# Patient Record
Sex: Female | Born: 1990 | Hispanic: No | Marital: Married | Smoking: Never smoker
Health system: Southern US, Community
[De-identification: ages and names within clinical notes are randomized; demographics above are authoritative.]

## PROBLEM LIST (undated history)

## (undated) ENCOUNTER — Inpatient Hospital Stay (HOSPITAL_COMMUNITY): Payer: Self-pay

## (undated) DIAGNOSIS — J45909 Unspecified asthma, uncomplicated: Secondary | ICD-10-CM

## (undated) DIAGNOSIS — Q839 Congenital malformation of breast, unspecified: Secondary | ICD-10-CM

## (undated) DIAGNOSIS — J40 Bronchitis, not specified as acute or chronic: Secondary | ICD-10-CM

## (undated) DIAGNOSIS — F419 Anxiety disorder, unspecified: Secondary | ICD-10-CM

## (undated) DIAGNOSIS — D649 Anemia, unspecified: Secondary | ICD-10-CM

## (undated) DIAGNOSIS — S83519A Sprain of anterior cruciate ligament of unspecified knee, initial encounter: Secondary | ICD-10-CM

## (undated) HISTORY — DX: Unspecified asthma, uncomplicated: J45.909

## (undated) HISTORY — DX: Anemia, unspecified: D64.9

## (undated) HISTORY — PX: DILATION AND CURETTAGE OF UTERUS: SHX78

## (undated) HISTORY — DX: Anxiety disorder, unspecified: F41.9

## (undated) HISTORY — DX: Bronchitis, not specified as acute or chronic: J40

---

## 2012-12-16 DIAGNOSIS — Z8659 Personal history of other mental and behavioral disorders: Secondary | ICD-10-CM | POA: Insufficient documentation

## 2013-04-23 ENCOUNTER — Encounter (HOSPITAL_COMMUNITY): Payer: Self-pay | Admitting: *Deleted

## 2013-04-23 ENCOUNTER — Inpatient Hospital Stay (HOSPITAL_COMMUNITY)
Admission: AD | Admit: 2013-04-23 | Discharge: 2013-04-23 | Disposition: A | Discharge status: Home / Self Care | Payer: Medicaid Other | Source: Ambulatory Visit | Attending: Obstetrics and Gynecology | Admitting: Obstetrics and Gynecology

## 2013-04-23 DIAGNOSIS — M545 Low back pain, unspecified: Secondary | ICD-10-CM

## 2013-04-23 DIAGNOSIS — O26899 Other specified pregnancy related conditions, unspecified trimester: Secondary | ICD-10-CM

## 2013-04-23 DIAGNOSIS — N949 Unspecified condition associated with female genital organs and menstrual cycle: Secondary | ICD-10-CM | POA: Insufficient documentation

## 2013-04-23 DIAGNOSIS — O4700 False labor before 37 completed weeks of gestation, unspecified trimester: Secondary | ICD-10-CM | POA: Insufficient documentation

## 2013-04-23 DIAGNOSIS — O99891 Other specified diseases and conditions complicating pregnancy: Secondary | ICD-10-CM

## 2013-04-23 DIAGNOSIS — Z3493 Encounter for supervision of normal pregnancy, unspecified, third trimester: Secondary | ICD-10-CM

## 2013-04-23 DIAGNOSIS — M549 Dorsalgia, unspecified: Secondary | ICD-10-CM | POA: Insufficient documentation

## 2013-04-23 LAB — CBC
HCT: 29.7 % — ABNORMAL LOW (ref 36.0–46.0)
MCH: 28.5 pg (ref 26.0–34.0)
MCHC: 34 g/dL (ref 30.0–36.0)
MCV: 83.9 fL (ref 78.0–100.0)
Platelets: 198 10*3/uL (ref 150–400)
RDW: 13.8 % (ref 11.5–15.5)
WBC: 10.3 10*3/uL (ref 4.0–10.5)

## 2013-04-23 LAB — WET PREP, GENITAL: Trich, Wet Prep: NONE SEEN

## 2013-04-23 LAB — URINE MICROSCOPIC-ADD ON

## 2013-04-23 LAB — URINALYSIS, ROUTINE W REFLEX MICROSCOPIC
Glucose, UA: NEGATIVE mg/dL
Hgb urine dipstick: NEGATIVE
Protein, ur: NEGATIVE mg/dL
Specific Gravity, Urine: 1.02 (ref 1.005–1.030)
pH: 7 (ref 5.0–8.0)

## 2013-04-23 LAB — DIFFERENTIAL
Basophils Absolute: 0 10*3/uL (ref 0.0–0.1)
Basophils Relative: 0 % (ref 0–1)
Eosinophils Absolute: 0.1 10*3/uL (ref 0.0–0.7)
Eosinophils Relative: 1 % (ref 0–5)
Lymphocytes Relative: 29 % (ref 12–46)
Monocytes Absolute: 1.1 10*3/uL — ABNORMAL HIGH (ref 0.1–1.0)
Neutrophils Relative %: 59 % (ref 43–77)

## 2013-04-23 LAB — HEPATITIS B SURFACE ANTIGEN: Hepatitis B Surface Ag: NEGATIVE

## 2013-04-23 NOTE — MAU Provider Note (Cosign Needed)
@MAUPATCONTACT @  Chief Complaint:  Back Pain and Vaginal Pain   Barbara Ortiz is  22 y.o. [id number] at [redacted]w[redacted]d presents complaining of back pain and vaginal pain. She describes this pain as sharp intermittent pain that shoots into her vaginal area and has been occuring for 1 week.  She endorses some mild lightheadedness on occasions, but corrects with sitting down.  She reports she had PNC at [facility] up until 27 weeks, were she then experienced some issues with her medicaid card and was unable to obtain care. She is now wanting to be seen in [place] and is willing to be seen in the [facility]. She was unable to have her GTT completed prior to interruption in care. She denies leaking, gush of fluid or vaginal bleeding. She denies contractions, she has been able to feel her baby move regularly. She reports she had a anatomy US completed and states it is a boy and there is no complications she is aware of.   Obstetrical/Gynecological History: OB History   Grav Para Term Preterm Abortions TAB SAB Ect Mult Living   5 3 3  0 1 0 1 0 0 3     Past Medical History: History reviewed. No pertinent past medical history.  Past Surgical History: History reviewed. No pertinent past surgical history.  Family History: History reviewed. No pertinent family history.  Social History: History  Substance Use Topics  . Smoking status: Never Smoker   . Smokeless tobacco: Never Used  . Alcohol Use: No    Allergies: No Known Allergies  Meds:  Prescriptions prior to admission  Medication Sig Dispense Refill  . flintstones complete (FLINTSTONES) 60 MG chewable tablet Chew 2 tablets by mouth daily.        Review of Systems -   Review of Systems  Constitutional: Negative for fever, chills, weight loss, malaise/fatigue and diaphoresis.  HENT: Negative for hearing loss, ear pain, nosebleeds, congestion, sore throat, neck pain, tinnitus and ear discharge.   Eyes: Negative for blurred vision, double vision, photophobia,  pain, discharge and redness.  Respiratory: Negative for cough, hemoptysis, sputum production, shortness of breath, wheezing and stridor.   Cardiovascular: Negative for chest pain, palpitations, orthopnea,  leg swelling  Gastrointestinal: Negative for abdominal pain heartburn, nausea, vomiting, diarrhea, constipation, blood in stool Genitourinary: Negative for dysuria, urgency, frequency, hematuria and flank pain.  Musculoskeletal: Negative for myalgias, back pain, joint pain and falls.  Skin: Negative for itching and rash.  Neurological: Negative for  tingling, tremors, sensory change, speech change, focal weakness, seizures, loss of consciousness, weakness and headaches.  Endo/Heme/Allergies: Negative for environmental allergies and polydipsia. Does not bruise/bleed easily.  Psychiatric/Behavioral: Negative for depression, suicidal ideas, hallucinations, memory loss and substance abuse. The patient is not nervous/anxious and does not have insomnia.      Physical Exam  Blood pressure 123/83, pulse 94, temperature 99 F (37.2 C), temperature source Oral, resp. rate 18, height 5\' 3"  (1.6 m), weight 76.658 kg (169 lb). GENERAL: Well-developed, well-nourished female in no acute distress.  LUNGS: Clear to auscultation bilaterally.  HEART: Regular rate and rhythm. 1/6 SM. ABDOMEN: Soft, nontender, nondistended, gravid.  EXTREMITIES: Nontender, no edema, 2+ distal pulses. NEURO: alert, oriented. No focal deficits.  CERVICAL EXAM: Dilatation: FT   Effacement: thick   Station: high  FHT:  Baseline rate 140 bpm   Variability moderate  Accelerations present   Decelerations none Contractions: irregular   Labs: Results for orders placed during the hospital encounter of 04/23/13 (from the past 24 hour(s))  URINALYSIS, ROUTINE W REFLEX MICROSCOPIC   Collection Time    04/23/13  1:15 PM      Result Value Range   Color, Urine YELLOW  YELLOW   APPearance CLEAR  CLEAR   Specific Gravity, Urine 1.020   1.005 - 1.030   pH 7.0  5.0 - 8.0   Glucose, UA NEGATIVE  NEGATIVE mg/dL   Hgb urine dipstick NEGATIVE  NEGATIVE   Bilirubin Urine NEGATIVE  NEGATIVE   Ketones, ur NEGATIVE  NEGATIVE mg/dL   Protein, ur NEGATIVE  NEGATIVE mg/dL   Urobilinogen, UA 0.2  0.0 - 1.0 mg/dL   Nitrite NEGATIVE  NEGATIVE   Leukocytes, UA TRACE (*) NEGATIVE  URINE MICROSCOPIC-ADD ON   Collection Time    04/23/13  1:15 PM      Result Value Range   Squamous Epithelial / LPF MANY (*) RARE   WBC, UA 3-6  <3 WBC/hpf   RBC / HPF 0-2  <3 RBC/hpf   Bacteria, UA FEW (*) RARE   Urine-Other MUCOUS PRESENT     Imaging Studies:  No results found.  Assessment/Plan: Barbara Ortiz is  22 y.o. [phone] at [redacted]w[redacted]d presents with back and vaginal pain.  UA: trace leuks, moderate bacteria, no nitrites, MANY squamous. Urine culture pending. Asx OB panel completed, along with GC probe  Wet prep: Neg for clue cells, trich or yeast Cervical exam: FT/thick/high  Fowarded pt information to [facility] for them to contact her regarding prenatal care.   Encouraged patient to drink plenty of water daily and continue PNV  Kuneff, Renee 10/8/20142:24 PM  I spoke with and examined patient and agree with resident's note and plan of care.  Tawana Scale, MD OB Fellow 04/23/2013 8:34 PM

## 2013-04-23 NOTE — MAU Note (Signed)
Pain in back and in vagina for past wk.  Last seen a month ago, uncomplicated. Was getting care in [facility].  Had regular medicaid, preg medicaid pending; got dropped due to financial; has been approved- waiting on card.

## 2013-04-24 LAB — GC/CHLAMYDIA PROBE AMP
CT Probe RNA: NEGATIVE
GC Probe RNA: NEGATIVE

## 2013-04-24 LAB — URINE CULTURE

## 2013-04-24 LAB — RUBELLA SCREEN: Rubella: 1.2 Index — ABNORMAL HIGH (ref <0.90)

## 2013-05-02 ENCOUNTER — Encounter (HOSPITAL_COMMUNITY): Payer: Self-pay | Admitting: *Deleted

## 2013-05-02 ENCOUNTER — Inpatient Hospital Stay (HOSPITAL_COMMUNITY)
Admission: AD | Admit: 2013-05-02 | Discharge: 2013-05-02 | Disposition: A | Discharge status: Home / Self Care | Payer: Medicaid Other | Source: Ambulatory Visit | Attending: Obstetrics & Gynecology | Admitting: Obstetrics & Gynecology

## 2013-05-02 DIAGNOSIS — O212 Late vomiting of pregnancy: Secondary | ICD-10-CM | POA: Insufficient documentation

## 2013-05-02 DIAGNOSIS — K2900 Acute gastritis without bleeding: Secondary | ICD-10-CM

## 2013-05-02 DIAGNOSIS — A059 Bacterial foodborne intoxication, unspecified: Secondary | ICD-10-CM | POA: Insufficient documentation

## 2013-05-02 DIAGNOSIS — R109 Unspecified abdominal pain: Secondary | ICD-10-CM | POA: Insufficient documentation

## 2013-05-02 DIAGNOSIS — K2970 Gastritis, unspecified, without bleeding: Secondary | ICD-10-CM | POA: Insufficient documentation

## 2013-05-02 DIAGNOSIS — O99891 Other specified diseases and conditions complicating pregnancy: Secondary | ICD-10-CM | POA: Insufficient documentation

## 2013-05-02 LAB — URINALYSIS, ROUTINE W REFLEX MICROSCOPIC
Glucose, UA: NEGATIVE mg/dL
Hgb urine dipstick: NEGATIVE
Nitrite: NEGATIVE
Specific Gravity, Urine: >1.03 — ABNORMAL HIGH (ref 1.005–1.030)
pH: 6.5 (ref 5.0–8.0)

## 2013-05-02 MED ORDER — ONDANSETRON 4 MG PO TBDP: 4.0000 mg | ORAL_TABLET | Freq: Three times a day (TID) | ORAL | Status: DC | PRN

## 2013-05-02 MED ORDER — PROMETHAZINE HCL 25 MG/ML IJ SOLN
12.5000 mg | Freq: Once | INTRAMUSCULAR | Status: AC
  Administered 2013-05-02: 12.5 mg via INTRAVENOUS
  Filled 2013-05-02: qty 1

## 2013-05-02 MED ORDER — ONDANSETRON 4 MG PO TBDP
4.0000 mg | ORAL_TABLET | Freq: Once | ORAL | Status: AC
  Administered 2013-05-02: 4 mg via ORAL
  Filled 2013-05-02: qty 1

## 2013-05-02 MED ORDER — LACTATED RINGERS IV BOLUS (SEPSIS)
1000.0000 mL | Freq: Once | INTRAVENOUS | Status: AC
  Administered 2013-05-02: 1000 mL via INTRAVENOUS

## 2013-05-02 NOTE — MAU Provider Note (Signed)
History     CSN: [id number]  Arrival date and time: 05/02/13 0049   None     Chief Complaint  Patient presents with  . Abdominal Pain  . Emesis  . Nausea   Abdominal Pain Associated symptoms include headaches, nausea (non bloody) and vomiting. Pertinent negatives include no constipation, diarrhea, dysuria or fever.  Emesis  Associated symptoms include abdominal pain and headaches. Pertinent negatives include no chest pain, chills, coughing, diarrhea, dizziness or fever.   Barbara Ortiz is a 22 y.o. [id number] at [redacted]w[redacted]d presents for evaluation of nausea and vomiting that has began this morning. Pt has had minimal PO tolerance today. She reports waking up nauseated then tried to eat breakfast and had non bloody emesis. Pt has been unable to tolerate anything by mouth since and has thrown up 5-6 times throughout the day. She attempted dinner of fried fish and hush puppies and she vomited that up as well. Pt reports a mild headache but otherwise is feeling well. Reports she has had 1-2 contractions an hour since this afternoon.  OB History   Grav Para Term Preterm Abortions TAB SAB Ect Mult Living   5 3 3  0 1 0 1 0 0 3      No past medical history on file.  No past surgical history on file.  Family History  Problem Relation Age of Onset  . Hypertension Mother     History  Substance Use Topics  . Smoking status: Never Smoker   . Smokeless tobacco: Never Used  . Alcohol Use: No    Allergies: No Known Allergies  Prescriptions prior to admission  Medication Sig Dispense Refill  . flintstones complete (FLINTSTONES) 60 MG chewable tablet Chew 2 tablets by mouth daily.        Review of Systems  Constitutional: Negative for fever, chills and diaphoresis.  Eyes: Negative for blurred vision.  Respiratory: Negative for cough.   Cardiovascular: Negative for chest pain.  Gastrointestinal: Positive for nausea (non bloody), vomiting and abdominal pain. Negative for heartburn,  diarrhea and constipation.  Genitourinary: Negative for dysuria.  Neurological: Positive for headaches. Negative for dizziness, tingling and weakness.   Physical Exam   There were no vitals taken for this visit.  Physical Exam  Nursing note and vitals reviewed. Constitutional: She appears well-developed and well-nourished. No distress.  Appears well hydrate  HENT:  Head: Normocephalic and atraumatic.  Eyes: Conjunctivae and EOM are normal. Right eye exhibits no discharge. Left eye exhibits no discharge.  Neck: Normal range of motion. Neck supple.  Cardiovascular: Normal rate, regular rhythm and normal heart sounds.  Exam reveals no gallop and no friction rub.   No murmur heard. Respiratory: Effort normal and breath sounds normal. No respiratory distress. She has no wheezes. She has no rales. She exhibits no tenderness.  GI: Soft. Bowel sounds are normal. She exhibits no distension and no mass. There is tenderness (minimal soreness on LLQ). There is no rebound and no guarding.  Musculoskeletal: Normal range of motion.  Skin: She is not diaphoretic.  Psychiatric: She has a normal mood and affect. Her behavior is normal. Judgment and thought content normal.   FHT: 140s mod var, mult accels, no decels Toco:2 ctx in  Results for JAME, SEELIG (MRN [id number]) as of 05/02/2013 01:51  Ref. Range 05/02/2013 00:55  Color, Urine Latest Range: YELLOW  YELLOW  APPearance Latest Range: CLEAR  CLEAR  Specific Gravity, Urine Latest Range: 1.005-1.030  >1.030 (H)  pH Latest Range: 5.0-8.0  6.5  Glucose Latest Range: NEGATIVE mg/dL NEGATIVE  Bilirubin Urine Latest Range: NEGATIVE  NEGATIVE  Ketones, ur Latest Range: NEGATIVE mg/dL NEGATIVE  Protein Latest Range: NEGATIVE mg/dL NEGATIVE  Urobilinogen, UA Latest Range: 0.0-1.0 mg/dL 0.2  Nitrite Latest Range: NEGATIVE  NEGATIVE  Leukocytes, UA Latest Range: NEGATIVE  NEGATIVE  Hgb urine dipstick Latest Range: NEGATIVE  NEGATIVE    MAU  Course  Procedures  MDM Will treat pts nausea with Zofran and attempt a PO challenge.  Urine is concentrated with poor PO tolerance throughout the day. Will give pt 1L bolus LR  Pt tolerated PO challenge without issue after zofran. Felt mildly nauseated following but no emesis. Given IV phenergan.  Assessment and Plan  Barbara Ortiz is a 22 y.o. [id number] at [redacted]w[redacted]d presents with complaints consistent with viral gastritis vs food poisoning. Pt symptoms improved with zofran. Pt tolerated PO and was comfortable going home. Pt given zofran Rx and recommended to keep her appts for her routine OB care.  PTL precautions and return if unable to tolerate PO or if pt has any concerning symptoms.  Jolyn Lent RYAN 05/02/2013, 1:39 AM

## 2013-05-02 NOTE — MAU Note (Signed)
Pt states she has not been able to eat. Pt states she tried to eat this 05/01/2013 1000 and 1700 Pt states she was not able to keep food down either time. Pt states she has been having abdominal pain as well

## 2013-05-06 DIAGNOSIS — D649 Anemia, unspecified: Secondary | ICD-10-CM | POA: Insufficient documentation

## 2013-05-15 ENCOUNTER — Encounter: Payer: Medicaid Other | Admitting: Advanced Practice Midwife

## 2013-07-30 ENCOUNTER — Encounter (HOSPITAL_COMMUNITY): Payer: Self-pay | Admitting: Emergency Medicine

## 2013-07-30 DIAGNOSIS — O909 Complication of the puerperium, unspecified: Secondary | ICD-10-CM | POA: Insufficient documentation

## 2013-07-30 NOTE — ED Notes (Signed)
Pt in c/o drainage from c-section incision from 12/16, drainage started today and states it was brown on the bandage, pt also noted a odor from area today

## 2013-07-31 ENCOUNTER — Emergency Department (HOSPITAL_COMMUNITY)
Admission: EM | Admit: 2013-07-31 | Discharge: 2013-07-31 | Disposition: A | Discharge status: Home / Self Care | Payer: Medicaid Other | Attending: Emergency Medicine | Admitting: Emergency Medicine

## 2013-07-31 DIAGNOSIS — T8140XA Infection following a procedure, unspecified, initial encounter: Secondary | ICD-10-CM

## 2013-07-31 MED ORDER — CEPHALEXIN 250 MG PO CAPS
500.0000 mg | ORAL_CAPSULE | Freq: Once | ORAL | Status: AC
  Administered 2013-07-31: 500 mg via ORAL
  Filled 2013-07-31: qty 2

## 2013-07-31 MED ORDER — CEPHALEXIN 500 MG PO CAPS: 500.0000 mg | ORAL_CAPSULE | Freq: Four times a day (QID) | ORAL | Status: DC

## 2013-07-31 NOTE — ED Provider Notes (Signed)
CSN: [id number]     Arrival date & time 07/30/13  2144 History   First MD Initiated Contact with Patient 07/31/13 0023     Chief Complaint  Patient presents with  . Post-op Problem   (Consider location/radiation/quality/duration/timing/severity/associated sxs/prior Treatment) HPI Comments: 23 year old female who presents approximately one month after having a cesarean section. She states that her incision has had a small amount of drainage and is starting to hurt. This started in the last 2 days, was gradual in onset, today she noticed an odor from the area and a small amount of brown discharge on her T-shirt. The symptoms are mild, worse with palpation. No associated fevers, nausea, vomiting, abdominal pain.  The history is provided by the patient.    History reviewed. No pertinent past medical history. Past Surgical History  Procedure Laterality Date  . Cesarean section     Family History  Problem Relation Age of Onset  . Hypertension Mother    History  Substance Use Topics  . Smoking status: Never Smoker   . Smokeless tobacco: Never Used  . Alcohol Use: No   OB History   Grav Para Term Preterm Abortions TAB SAB Ect Mult Living   5 3 3  0 1 0 1 0 0 3     Review of Systems  All other systems reviewed and are negative.    Allergies  Review of patient's allergies indicates no known allergies.  Home Medications   Current Outpatient Rx  Name  Route  Sig  Dispense  Refill  . cephALEXin (KEFLEX) 500 MG capsule   Oral   Take 1 capsule (500 mg total) by mouth 4 (four) times daily.   40 capsule   0    BP 128/48  Pulse 75  Temp(Src) 98 F (36.7 C) (Oral)  Resp 18  SpO2 99% Physical Exam  Nursing note and vitals reviewed. Constitutional: She appears well-developed and well-nourished. No distress.  HENT:  Head: Normocephalic and atraumatic.  Mouth/Throat: Oropharynx is clear and moist. No oropharyngeal exudate.  Eyes: Conjunctivae and EOM are normal. Pupils are  equal, round, and reactive to light. Right eye exhibits no discharge. Left eye exhibits no discharge. No scleral icterus.  Neck: Normal range of motion. Neck supple. No JVD present. No thyromegaly present.  Cardiovascular: Normal rate, regular rhythm, normal heart sounds and intact distal pulses.  Exam reveals no gallop and no friction rub.   No murmur heard. Pulmonary/Chest: Effort normal and breath sounds normal. No respiratory distress. She has no wheezes. She has no rales.  Abdominal: Soft. Bowel sounds are normal. She exhibits no distension and no mass. There is no tenderness.  Soft and nontender abdomen  Musculoskeletal: Normal range of motion. She exhibits no edema and no tenderness.  Lymphadenopathy:    She has no cervical adenopathy.  Neurological: She is alert. Coordination normal.  Skin: Skin is warm and dry. No rash noted. No erythema.  Cesarean section incision healing very well, there is still some surgical tape on the left side of the wound, the right side of the wound is not exposed, appears to be healing well though there is a small amount of tenderness underneath the wound there is no obvious drainage, discharge, induration or fluctuance of the wound. There is no surrounding erythema  Psychiatric: She has a normal mood and affect. Her behavior is normal.    ED Course  Procedures (including critical care time) Labs Review Labs Reviewed - No data to display Imaging Review No results  found.  EKG Interpretation   None       MDM   1. Post op infection    Overall the wound appears well, she is afebrile, normal pulse, normal pressure. Will place on Keflex and have followup with OB/GYN for wound check. Possible early abscess.  Meds given in ED:  Medications  cephALEXin (KEFLEX) capsule 500 mg (not administered)    New Prescriptions   CEPHALEXIN (KEFLEX) 500 MG CAPSULE    Take 1 capsule (500 mg total) by mouth 4 (four) times daily.        Vida RollerBrian D Aftin Lye,  MD 07/31/13 [id number]

## 2013-07-31 NOTE — ED Notes (Signed)
Pt reports noting brown foul smelling draining from her c-section incision site, steri-strips still in place and rt side of steri-strips w/ brown drainage and thus removed by this RN. Pt denies any fever, chills, n/v or body aches at present. Admits to some moderate pain to rt side of incision. Pt advised of proper wound care, instructed to keep area clean and dry, allowing area to be cleansed w/ mild soap while showering. Pt verbalized understanding.

## 2013-07-31 NOTE — ED Notes (Signed)
Pt ambulating independently w/ steady gait on d/c in no acute distress, A&Ox4. Rx given x1  

## 2013-08-11 ENCOUNTER — Inpatient Hospital Stay (HOSPITAL_COMMUNITY)
Admission: AD | Admit: 2013-08-11 | Discharge: 2013-08-11 | Disposition: A | Discharge status: Home / Self Care | Payer: Medicaid Other | Source: Ambulatory Visit | Attending: Obstetrics & Gynecology | Admitting: Obstetrics & Gynecology

## 2013-08-11 ENCOUNTER — Encounter (HOSPITAL_COMMUNITY): Payer: Self-pay

## 2013-08-11 DIAGNOSIS — O2390 Unspecified genitourinary tract infection in pregnancy, unspecified trimester: Secondary | ICD-10-CM | POA: Insufficient documentation

## 2013-08-11 DIAGNOSIS — N644 Mastodynia: Secondary | ICD-10-CM | POA: Insufficient documentation

## 2013-08-11 DIAGNOSIS — B372 Candidiasis of skin and nail: Secondary | ICD-10-CM

## 2013-08-11 DIAGNOSIS — O9229 Other disorders of breast associated with pregnancy and the puerperium: Secondary | ICD-10-CM

## 2013-08-11 MED ORDER — FLUCONAZOLE 150 MG PO TABS: 150.0000 mg | ORAL_TABLET | Freq: Every day | ORAL | Status: DC

## 2013-08-11 MED ORDER — MUPIROCIN CALCIUM 2 % EX CREA: 1.0000 "application " | TOPICAL_CREAM | CUTANEOUS | Status: DC | PRN

## 2013-08-11 NOTE — MAU Provider Note (Signed)
  History     CSN: [id number]  Arrival date and time: 08/11/13 1203   First Provider Initiated Contact with Patient 08/11/13 1220      Chief Complaint  Patient presents with  . Breast Pain   HPI  Barbara Ortiz is a 23 y.o. [id number] who is about 1 month PP. She has been breastfeeding without difficulty. However at 0400 this morning she developed severe right breast pain. She states that the baby had last eaten at 0200. At 0400 when the pain started she attempted to hand express in the shower, but was not able to express any milk. She has not tried to express any milk or pump any since that time.   She was recently prescribed Kelfex for suspected wound infection. She has not been taking it as prescribed, and last took a dose on Saturday.  History reviewed. No pertinent past medical history.  Past Surgical History  Procedure Laterality Date  . Cesarean section      Family History  Problem Relation Age of Onset  . Hypertension Mother     History  Substance Use Topics  . Smoking status: Never Smoker   . Smokeless tobacco: Never Used  . Alcohol Use: No    Allergies: No Known Allergies  Prescriptions prior to admission  Medication Sig Dispense Refill  . cephALEXin (KEFLEX) 500 MG capsule Take 1 capsule (500 mg total) by mouth 4 (four) times daily.  40 capsule  0    ROS Physical Exam   Blood pressure 120/79, pulse 76, temperature 97.8 F (36.6 C), temperature source Oral, resp. rate 20, currently breastfeeding.  Physical Exam  Nursing note and vitals reviewed. Constitutional: She is oriented to person, place, and time. She appears well-developed and well-nourished. No distress.  Cardiovascular: Normal rate.   Respiratory: Effort normal. Right breast exhibits tenderness. Right breast exhibits no inverted nipple, no mass and no skin change. Nipple discharge: milk expressed  Left breast exhibits no inverted nipple, no mass, no skin change and no tenderness. Nipple  discharge: milk expressed  Breasts are symmetrical.  GI: Soft. There is no tenderness.  Incision: C/D/I   Neurological: She is alert and oriented to person, place, and time.  Skin: Skin is warm and dry.  Psychiatric: She has a normal mood and affect.    MAU Course  Procedures  1520: Lactation here to see the patient 1615: LC agrees that likely yeast/cracked nipples. Will treat with Difuclan x 10 days and APNO.   Assessment and Plan   1. Sore nipples due to lactation   2. Skin yeast infection      Medication List         cephALEXin 500 MG capsule  Commonly known as:  KEFLEX  Take 1 capsule (500 mg total) by mouth 4 (four) times daily.     fluconazole 150 MG tablet  Commonly known as:  DIFLUCAN  Take 1 tablet (150 mg total) by mouth daily.     mupirocin cream 2 %  Commonly known as:  BACTROBAN  Apply 1 application topically as needed.       Follow-up Information   [facility]. (As needed)    Specialty:  Lactation   Contact information:   [address] [id number] [place] [id number] [phone]       Tawnya CrookHogan, [name] Donovan 08/11/2013, 12:27 PM

## 2013-08-11 NOTE — Consult Note (Addendum)
Lactation Consultation Note  Patient Name: Barbara Yin ZOXWR'U Date: 08/11/2013  Lactation services called by  MAU for this patient ,  Reason for visit - right breast pain . Per Midwife patient came in via ambulance -  Per mom fed the baby at 2 am for 10 mins , woke up at 4 am  Due to pain in right breast , tried to hand express with some results, Also took a warm shower thinking it might help , some leaking noted. Mom denies having any temperature, chills , flu like symptoms, or pain in her breast prior  To this pain in the middle of the night. Had been treated for a wound infection with Keflex ,  but hasn't been good about taking the antibiotics. Mom denies recent yeast infection ,  but has had vaginal  yeast infection at [redacted] weeks pregnant and tx.Per mom breast fed her  other 3 children for 3-4 months and never has experienced such pain. Also the 1st time  she felt the pain was 4 am today. Per mom has been pumping between feedings 4 x's per day. Also feeding baby at the breast 5-6 times a day for 10 -15 mins, Milk came in 3- to 4 days after delivery  And reports no engorgement issues at that time or in the last 48 hours. Baby's birth weight = 8-3 oz, D/C weight 8-3 oz , 1st Dr, Visit - 8-3 oz at 3-4 days, 1/14 weight - 8-13 oz, 1/20 8-12 oz ( per mom)  Per mom delivered at [facility] and was seen initially in [facility]. Thurmond Butts [facility] and  switched when she moved to live at [employer] in [place] with her 4 kids. Presently she has been seeing  Dr. Pricilla Holm at [facility] , 1 week ago baby was seen and was dx . Per mom with Ginette Pitman ( he hasn't been tx due to  No having a Medicaid # and I plan to get it tomorrow. The thrush is better and his diaper rash. Mom also mentioned the baby is needing to be supplemented due to slow weight gain after feedings with 1 oz formula. F/U weight 1/30 with Dr. Pricilla Holm. @ [facility]. Last time pumped was at 1230 p with good am't per mom , esp. off the  right breast up 70 ml  LC assessed breast tissue after History was given by mom , breast soft to full , no engorgement, no plugs on either breast . When LC gently compressed the outer aspect of the right breast , mom complained of a lot pain . LC noted cracking at the base of both nipples , greater on the right than left , also noted the right had been bleeding at the base. Per mom right painful and left better,LC easily hand expressed milk form the left , to painful on the right for mom.  While LC present had mom pump with Symphony on standard mode low setting , right ( #27 Flange to tight and painful , switched to #30, per mom more comfortable , at 1st still painful. ( sent #36 flange home with mom in case she needed it for comfort ) , #27 Flange for the left per mom comfortable. Pump yield greater volume on the left compared to the right .  Lactation Plan of Care - F/U appointment with lactation Monday Feb, 2nd,2015 at 230 pm ( directions given to mom )                                                   -  Goal is to protect milk supply by pumping until the nipples are healed                                           - Be treated for yeast with All purpose nipple cream and Diflucan  ( from MIdwife                                                                                                                                                                   Barbara Ortiz                                           - For the next 24 -48 hours just pump both breast every 3 hours for 15 - 20 mins                                                                             ( #30 Flange on the right and #27 flange on Left )                                           - To decrease friction - use olive oil on nipples / areolas.                                            - After pumping rinse nipples with 1tb white vinegar and 1 cup water                                           - Comfort  Gels ( cold after  pumping for 6 days ), rinse with warm water                                           - All purpose nipple  cream - use as directed after cold comfort gels                                           - During pumping massage and hand express after pumping.                                           - Can not freeze milk -                                           - boil for 20 mins - pump pieces, bottle nipples, pacifiers,                                          - Try to avoid sweets , yeast foods, and diary products                     Baby tx - after feedings rinse  or wipe baby's mouth with small am't water , apply anti fungal after rinse as directed ( Mom has a script from 1 week ago visit ) , and diaper cream for rash . Follow feeding  Plan for baby per Dr. Pricilla Holm. Due to soreness, need to feed from a bottle 4- 5 oz of EBM and or formula.                                                                                        Maternal Data    Feeding    [facility] Score/Interventions                      Lactation Tools Discussed/Used     Consult Status      Barbara Ortiz 08/11/2013, 5:09 PM

## 2013-08-11 NOTE — MAU Note (Signed)
Patient [facility]. States she has been successfully breast feeding. States the baby has had thrush. States she had sudden onset of right breast pain when she woke up at 0400. Thought it was engorgement and expressed milk when the baby would not eat then took a hot shower. States pain is getting worse.

## 2013-08-11 NOTE — Progress Notes (Signed)
Idamae LusherMargaret Iorio, RN, [facility] evaluating patient and discussing treatment options.

## 2013-08-18 ENCOUNTER — Ambulatory Visit (HOSPITAL_COMMUNITY): Admit: 2013-08-18 | Payer: Medicaid Other

## 2013-08-26 ENCOUNTER — Ambulatory Visit (HOSPITAL_COMMUNITY): Admission: RE | Admit: 2013-08-26 | Payer: Medicaid Other | Source: Ambulatory Visit

## 2013-10-23 NOTE — MAU Provider Note (Signed)
Attestation of Attending Supervision of Advanced Practitioner (PA/CNM/NP): Evaluation and management procedures were performed by the Advanced Practitioner under my supervision and collaboration.  I have reviewed the Advanced Practitioner's note and chart, and I agree with the management and plan.  Tymere Depuy, MD, FACOG Attending Obstetrician & Gynecologist Faculty Practice, [facility]  

## 2013-11-06 ENCOUNTER — Emergency Department (HOSPITAL_COMMUNITY)
Admission: EM | Admit: 2013-11-06 | Discharge: 2013-11-06 | Discharge status: Against Medical Advice | Payer: Medicaid Other | Attending: Emergency Medicine | Admitting: Emergency Medicine

## 2013-11-06 ENCOUNTER — Encounter (HOSPITAL_COMMUNITY): Payer: Self-pay | Admitting: Emergency Medicine

## 2013-11-06 DIAGNOSIS — R109 Unspecified abdominal pain: Secondary | ICD-10-CM | POA: Insufficient documentation

## 2013-11-06 NOTE — ED Notes (Signed)
Pt no longer in triage

## 2013-11-06 NOTE — ED Notes (Signed)
Pt called from triage twice with no answer

## 2013-11-06 NOTE — ED Notes (Signed)
Per patient-went to [facility] today and MD said "you're too tender, you need someone to check your birth control." Patient has paraguard for birth control-has had since 08/27/13. Says this is the first time she has had this type of pain with this birth control. Denies any strenuous lifting at work that could have precipitated pain. Reports that pain does not radiate into shoulders but sometimes goes down her back and into her buttocks. Describes pain as sharp and stabbing, made worse when patient moves or walks and stops at rest.

## 2014-05-18 ENCOUNTER — Encounter (HOSPITAL_COMMUNITY): Payer: Self-pay | Admitting: Emergency Medicine

## 2014-12-18 ENCOUNTER — Emergency Department (HOSPITAL_COMMUNITY)
Admission: EM | Admit: 2014-12-18 | Discharge: 2014-12-18 | Disposition: A | Discharge status: Home / Self Care | Payer: Medicaid Other | Attending: Emergency Medicine | Admitting: Emergency Medicine

## 2014-12-18 ENCOUNTER — Encounter (HOSPITAL_COMMUNITY): Payer: Self-pay | Admitting: Emergency Medicine

## 2014-12-18 DIAGNOSIS — L02411 Cutaneous abscess of right axilla: Secondary | ICD-10-CM | POA: Diagnosis not present

## 2014-12-18 DIAGNOSIS — R110 Nausea: Secondary | ICD-10-CM | POA: Insufficient documentation

## 2014-12-18 DIAGNOSIS — Z792 Long term (current) use of antibiotics: Secondary | ICD-10-CM | POA: Diagnosis not present

## 2014-12-18 DIAGNOSIS — Z7951 Long term (current) use of inhaled steroids: Secondary | ICD-10-CM | POA: Insufficient documentation

## 2014-12-18 MED ORDER — CEPHALEXIN 500 MG PO CAPS: 500.0000 mg | ORAL_CAPSULE | Freq: Four times a day (QID) | ORAL | Status: DC

## 2014-12-18 NOTE — Discharge Instructions (Signed)
Abscess Apply warm compresses several times a day.  Take antibiotics as prescribed.  An abscess is an infected area that contains a collection of pus and debris.It can occur in almost any part of the body. An abscess is also known as a furuncle or boil. CAUSES  An abscess occurs when tissue gets infected. This can occur from blockage of oil or sweat glands, infection of hair follicles, or a minor injury to the skin. As the body tries to fight the infection, pus collects in the area and creates pressure under the skin. This pressure causes pain. People with weakened immune systems have difficulty fighting infections and get certain abscesses more often.  SYMPTOMS Usually an abscess develops on the skin and becomes a painful mass that is red, warm, and tender. If the abscess forms under the skin, you may feel a moveable soft area under the skin. Some abscesses break open (rupture) on their own, but most will continue to get worse without care. The infection can spread deeper into the body and eventually into the bloodstream, causing you to feel ill.  DIAGNOSIS  Your caregiver will take your medical history and perform a physical exam. A sample of fluid may also be taken from the abscess to determine what is causing your infection. TREATMENT  Your caregiver may prescribe antibiotic medicines to fight the infection. However, taking antibiotics alone usually does not cure an abscess. Your caregiver may need to make a small cut (incision) in the abscess to drain the pus. In some cases, gauze is packed into the abscess to reduce pain and to continue draining the area. HOME CARE INSTRUCTIONS   Only take over-the-counter or prescription medicines for pain, discomfort, or fever as directed by your caregiver.  If you were prescribed antibiotics, take them as directed. Finish them even if you start to feel better.  If gauze is used, follow your caregiver's directions for changing the gauze.  To avoid  spreading the infection:  Keep your draining abscess covered with a bandage.  Wash your hands well.  Do not share personal care items, towels, or whirlpools with others.  Avoid skin contact with others.  Keep your skin and clothes clean around the abscess.  Keep all follow-up appointments as directed by your caregiver. SEEK MEDICAL CARE IF:   You have increased pain, swelling, redness, fluid drainage, or bleeding.  You have muscle aches, chills, or a general ill feeling.  You have a fever. MAKE SURE YOU:   Understand these instructions.  Will watch your condition.  Will get help right away if you are not doing well or get worse. Document Released: 04/12/2005 Document Revised: 01/02/2012 Document Reviewed: 09/15/2011 [facility] Patient Information 2015 [place], [place]. This information is not intended to replace advice given to you by your health care provider. Make sure you discuss any questions you have with your health care provider.  Emergency Department Resource Guide 1) Find a Doctor and Pay Out of Pocket Although you won't have to find out who is covered by your insurance plan, it is a good idea to ask around and get recommendations. You will then need to call the office and see if the doctor you have chosen will accept you as a new patient and what types of options they offer for patients who are self-pay. Some doctors offer discounts or will set up payment plans for their patients who do not have insurance, but you will need to ask so you aren't surprised when you get to your  appointment.  2) Contact Your Local Health Department Not all health departments have doctors that can see patients for sick visits, but many do, so it is worth a call to see if yours does. If you don't know where your local health department is, you can check in your phone book. The CDC also has a tool to help you locate your state's health department, and many state websites also have listings of all  of their local health departments.  3) Find a Walk-in Clinic If your illness is not likely to be very severe or complicated, you may want to try a walk in clinic. These are popping up all over the country in pharmacies, drugstores, and shopping centers. They're usually staffed by nurse practitioners or physician assistants that have been trained to treat common illnesses and complaints. They're usually fairly quick and inexpensive. However, if you have serious medical issues or chronic medical problems, these are probably not your best option.  No Primary Care Doctor: - Call Health Connect at  [phone] - they can help you locate a primary care doctor that  accepts your insurance, provides certain services, etc. - Physician Referral Service- [phone]  Chronic Pain Problems: Organization         Address  Phone   Notes  [facility]  [phone] Patients need to be referred by their primary care doctor.   Medication Assistance: Organization         Address  Phone   Notes  [facility] Medication [facility] [address] [place], [place] [address] [phone]) [phone] --Must be a resident of [place] -- Must have NO insurance coverage whatsoever (no Medicaid/ Medicare, etc.) -- The pt. MUST have a primary care doctor that directs their care regularly and follows them in the community   [facility]ist  [phone]   [employer]  [phone]    Agencies that provide inexpensive medical care: Organization         Address  Phone   Notes  [facility]  [phone]   [facility]    [phone]   [facility] [address] [place], [place] [address] [phone]   [facility] [address], [place] [phone]) [phone]   [facility]anned Parenthood    [phone]   [facility]    [phone]   [facility]  [address],  [place] Phone:  [phone], Fax:  [phone] Hours of Operation:  9 am - 6 pm, M-F.  Also accepts Medicaid/Medicare and self-pay.  [facility]  [address], [place] Phone: [phone], Fax: [phone]. Hours of Operation:  8:30 am - 5:30 pm, M-F.  Also accepts Medicaid and self-pay.  [facility] [place] [address], [place] Phone: [phone]) [phone]   [facility] [address], [place], [place] [phone], Ext. [phone] Mondays & Thursdays: 7-9 AM.  First 15 patients are seen on a first come, first serve basis.    Medicaid-accepting [facility] Providers:  Organization         Address  Phone   Notes  [facility] [address], [place] [phone] Also accepts self-pay patients.  [facility] [address] [place], [place]  [phone]) [phone]   [facility] [address], [place] [phone]   [facility] [address], [place] [phone]   Renaye Rakers [address], [place]   [phone] Only accepts [place] Access [place] patients after they have their name applied to their card.   Self-Pay (no insurance) in [facility]:  Organization         Address  Phone   Notes  Sickle Cell Patients, [facility] [address] [place], [place] [phone]   [facility] [address] [place], [place] [phone]   [facility] [place]  [address], [place] [phone]   [facility]/Dr. Osei-Bonsu  [address], [place] or [address], [place] [phone] Phone number for both [place] and [place] locations is the same.  [facility] [address] [place] [phone]   [facility] [address], [place] or [address] [phone]   [facility] [address], [place] [phone], phone; [phone], fax Sees patients 1st and 3rd Saturday of every month.  Must not qualify for public or private insurance (i.e. Medicaid, Medicare, [place] Health Choice, Veterans' Benefits)  Household income should be no more than 200% of the poverty level The clinic cannot treat you if you are pregnant or think you are pregnant  Sexually transmitted diseases are not treated at the clinic.    Dental Care: Organization         Address  Phone  Notes  [facility] Department of [facility] [address] [place], [place] [phone] Accepts children up to age 75 who are enrolled in [place] or [place] Health Choice; pregnant women with a Medicaid card; and children who have applied for Medicaid or [place] Health Choice, but were declined, whose parents can pay a reduced fee at time of service.  [facility] Department of [facility]  [address], [place] [phone] Accepts children up to age 25 who are enrolled in [place] or [place] Health Choice; pregnant women with a Medicaid card; and children who have applied for Medicaid or [place] Health Choice, but were declined, whose parents can pay a reduced fee at time of service.  [facility]  [address] [place], [place] [phone] Patients are seen by appointment only. Walk-ins are not accepted. [facility] will see patients 10 years of age and older. Monday - Tuesday (8am-5pm) Most Wednesdays (8:30-5pm) $30 per visit, cash only  [facility]  [address], [facility] [phone] Patients are seen by appointment only. Walk-ins are not accepted. [facility] will see patients 11 years of age and older. One Wednesday Evening (Monthly: Volunteer Based).  $30 per visit, cash only  [employer]   [phone] for adults; Children under age 64, call Graduate Pediatric Dentistry at [phone]. Children aged 16-14, please call [phone] to request a pediatric application.  Dental services are provided in all areas of dental care including fillings, crowns and bridges, complete and partial dentures, implants, gum treatment, root canals, and extractions. Preventive care is also provided. Treatment is provided to both adults and children. Patients are selected via a lottery and there is often a waiting list.   [facility] [address], [place]  [phone] [email or web address]   [facility] [address] [place], [place] [phone], Ext. [phone] Second and Fourth Thursday of each month, opens at 6:30 AM; Clinic ends at 9 AM.  Patients are seen on a first-come first-served basis, and a limited number are seen during each clinic.   [facility]  [address] [place], [place] [phone]   Eligibility Requirements You must have lived in [place], [place], or [place] counties for at least the last three months.   You cannot be eligible for state or federal sponsored [place], including [employer], [place], or [employer].   You generally cannot be eligible for healthcare insurance through your employer.    How to apply: Eligibility screenings are held every Tuesday and Wednesday afternoon from 1:00 pm until 4:00 pm. You do not need an appointment for the interview!  [facility] [address] [place], [place] [phone]   [facility] Health Department  [phone]   [facility] Health Department  [phone]   [facility] Health Department  [phone]    Behavioral Health Resources in the Community: Intensive Outpatient Programs Organization         Address  Phone  Notes  [facility] [address] [place], [place] [phone]   [facility] Outpatient [address], [place], [place] [phone]   [facility] [address], [place], [place]  [phone]   [facility] [address],  [place], [place] [phone] or [phone]   Substance Abuse Resources Organization         Address  Phone  Notes  [facility] Drug Services  [phone]   [facility]  [phone]   [facility]  [phone]   Floydene Flock  [phone]   Residential & Outpatient Substance Abuse Program  [phone]   Psychological Services Organization         Address  Phone  Notes  [facility]  [phone]   [facility] Services  [phone]   [facility] [address] [place] [address], [place] [phone] or [phone]    Mobile Crisis Teams Organization         Address  Phone  Notes  [facility], Mobile Crisis Care Unit  [phone]   [facility]  [address] [place], [place] [phone]   Doristine Locks [address] [place] [phone]    Self-Help/Support Groups Organization         Address  Phone             Notes  [facility] [place] - variety of support groups  336- I7437963 Call for more information  Narcotics Anonymous (NA), [facility] [address], [employer] [place]  2 meetings at this location   Statistician         Address  Phone  Notes  [facility] [address],    [place]  [phone]   [facility]  [address], [place] [address], [place], [place] [phone]   [facility] [address] [place], [place] [phone] Admissions: 8am-3pm M-F  [facility] [address],    [place], [place] [phone]   [facility] [address], [place], [place] [phone]   [facility] [address]  [place], [place] [phone]  [facility] - Intensive Outpatient [address], [name], [place] [phone]   °[facility] ([facility] [address]  °[place], [place] [phone] or [phone]   °[facility] ([facility]) [address] [place], [place] [phone] Accepts Medicaid  °[facility] [address]  °[place] 1-800-659-3381 Substance Abuse/Addiction Treatment  ° °[place] Behavioral Health Resources °Organization         Address  Phone  Notes  °[facility]  (888) [phone]   °Julie Brannon, PhD [address] [place], [place]   (336) [phone] or (336) [phone]   °[facility]   [address] °[place], [place] ([phone]) [phone]   °[facility] [address], [place], [place] (336) [phone] Insurance/Medicaid/sponsorship through [facility]  °[facility] [address]                                    [place], [place] [phone]) [phone] Therapy/tele-psych/case  °[facility] [address]  ° [place], [place] ([phone]) [phone]    °Dr. Arfeen  ([phone]) [phone]   °[facility] [place]  [employer] [place] [facility] 1) [address], [place] °2) [address], [place] °3)  [address], [place] ([phone]) [phone] °(336) [phone] ° °(336) [phone]   °[place] Child Abuse Hotline (336) 342-1394 or (336) 342-3537 (After Hours)    ° ° ° °

## 2014-12-18 NOTE — ED Notes (Signed)
Area of induration in right axilla x1 month. NO other associated Sx. Pt report scant purulent drainage early in course. NO open wound or erythema noted. Ambulatory. NAD

## 2014-12-18 NOTE — ED Provider Notes (Signed)
CSN: [id number]     Arrival date & time 12/18/14  1212 History  This chart was scribed for non-physician practitioner, Catha GosselinHanna Patel-Mills, PA-C working with Toy CookeyMegan Docherty, MD by Doreatha MartinEva Mathews, ED scribe. This patient was seen in room TR05C/TR05C and the patient's care was started at 1:49 PM    Chief Complaint  Patient presents with  . Abscess   The history is provided by the patient. No language interpreter was used.    HPI Comments: Barbara Ortiz is a 24 y.o. female who presents to the Emergency Department complaining of a gradually worsening area of moderate, burning pain and swelling in the right axilla since for the last month. She reports associated nausea. Pt states that she discontinued deodorant use with no relief. She states that pain is worsened with movement. Pt has not taken any OTC pain medication PTA. She denies any drainage from the wound. She denies having similar Sx in the past. She also denies fever, chills, vomiting.   History reviewed. No pertinent past medical history. Past Surgical History  Procedure Laterality Date  . Cesarean section     Family History  Problem Relation Age of Onset  . Hypertension Mother    History  Substance Use Topics  . Smoking status: Never Smoker   . Smokeless tobacco: Never Used  . Alcohol Use: No   OB History    Gravida Para Term Preterm AB TAB SAB Ectopic Multiple Living   5 4 4  0 1 0 1 0 0 4     Review of Systems  Constitutional: Negative for fever and chills.  Gastrointestinal: Positive for nausea. Negative for vomiting.  Musculoskeletal: Positive for arthralgias.  Skin: Positive for wound.   Allergies  Review of patient's allergies indicates no known allergies.  Home Medications   Prior to Admission medications   Medication Sig Start Date End Date Taking? Authorizing Provider  cephALEXin (KEFLEX) 500 MG capsule Take 1 capsule (500 mg total) by mouth 4 (four) times daily. 12/18/14   Damoney Julia Patel-Mills, PA-C  fluconazole  (DIFLUCAN) 150 MG tablet Take 1 tablet (150 mg total) by mouth daily. 08/11/13   Armando ReichertHeather D Hogan, CNM  mupirocin cream (BACTROBAN) 2 % Apply 1 application topically as needed. 08/11/13   Armando ReichertHeather D Hogan, CNM   BP 125/84 mmHg  Pulse 77  Temp(Src) 98 F (36.7 C) (Oral)  Resp 16  Wt 150 lb 8 oz (68.266 kg)  SpO2 100% Physical Exam  Constitutional: She is oriented to person, place, and time. She appears well-developed and well-nourished. No distress.  HENT:  Head: Normocephalic and atraumatic.  Mouth/Throat: Oropharynx is clear and moist.  Eyes: Conjunctivae and EOM are normal. Pupils are equal, round, and reactive to light.  Neck: Normal range of motion. Neck supple. No tracheal deviation present.  Cardiovascular: Normal rate, regular rhythm and normal heart sounds.  Exam reveals no gallop and no friction rub.   No murmur heard. Pulmonary/Chest: Effort normal and breath sounds normal. No respiratory distress.  Abdominal: Soft. She exhibits no distension. There is no tenderness. There is no rebound and no guarding.  Musculoskeletal: Normal range of motion. She exhibits no edema or tenderness.  Neurological: She is alert and oriented to person, place, and time.  Skin: Skin is warm and dry.  Right 1cm indurated abscess with no fluctuance. TTP. No erythema or surrounding cellulitis.   Psychiatric: She has a normal mood and affect. Her behavior is normal.  Nursing note and vitals reviewed.   ED Course  Procedures (including critical care time) .DIAGNOSTIC STUDIES: Oxygen Saturation is 100% on RA, normal by my interpretation.    COORDINATION OF CARE: 1:53 PM Discussed treatment plan with pt at bedside and pt agreed to plan.   Labs Review Labs Reviewed - No data to display  Imaging Review No results found.   EKG Interpretation None     MDM   Final diagnoses:  Abscess of axilla, right  Patient presents for right axilla abscess that does not require I&D at this time. It is  indurated without fluctuance or surrounding cellulitis.  There is no apparent head to the abscess.  She has no other abscesses in the left axilla or groin areas. She denies any fevers and is afebrile with stable vital signs in the ED. She can apply warm compresses several times daily.  I have prescribed keflex and she can take tylenol or ibuprofen for pain as needed. Patient verbally agrees with the plan.   I personally performed the services described in this documentation, which was scribed in my presence. The recorded information has been reviewed and is accurate.   Catha Gosselin, PA-C 12/19/14 1224  Toy Cookey, MD 12/21/14 1540

## 2015-06-05 ENCOUNTER — Encounter (HOSPITAL_COMMUNITY): Payer: Self-pay | Admitting: Emergency Medicine

## 2015-06-05 ENCOUNTER — Emergency Department (HOSPITAL_COMMUNITY)
Admission: EM | Admit: 2015-06-05 | Discharge: 2015-06-05 | Disposition: A | Discharge status: Home / Self Care | Payer: Medicaid Other | Attending: Emergency Medicine | Admitting: Emergency Medicine

## 2015-06-05 DIAGNOSIS — J029 Acute pharyngitis, unspecified: Secondary | ICD-10-CM

## 2015-06-05 MED ORDER — IBUPROFEN 800 MG PO TABS
800.0000 mg | ORAL_TABLET | Freq: Once | ORAL | Status: AC
  Administered 2015-06-05: 800 mg via ORAL
  Filled 2015-06-05: qty 1

## 2015-06-05 MED ORDER — DEXAMETHASONE SODIUM PHOSPHATE 10 MG/ML IJ SOLN
10.0000 mg | Freq: Once | INTRAMUSCULAR | Status: AC
  Administered 2015-06-05: 10 mg via INTRAMUSCULAR
  Filled 2015-06-05: qty 1

## 2015-06-05 NOTE — ED Notes (Signed)
Pt. reports sore throat , dry mouth and occasional dry cough onset 2 days ago , denies SOB / no fever or chills.

## 2015-06-05 NOTE — ED Notes (Signed)
Pt verbalized understanding of d/c instructions, prescriptions, and follow-up care. No further questions/concerns, VSS, assisted to lobby in wheelchair.  

## 2015-06-05 NOTE — ED Provider Notes (Signed)
CSN: [id number]     Arrival date & time 06/05/15  0204 History  By signing my name below, I, Sonum Patel, attest that this documentation has been prepared under the direction and in the presence of Tomasita Crumble, MD. Electronically Signed: Sonum Patel, Neurosurgeon. 06/05/2015. 3:44 AM.    Chief Complaint  Patient presents with  . Sore Throat    The history is provided by the patient. No language interpreter was used.     HPI Comments: Barbara Ortiz is a 24 y.o. female who presents to the Emergency Department complaining of 1 day of gradual onset, gradually worsening, constant sore throat with associated rhinorrhea and a dry cough. She states the pain is worse with eating and drinking. She has not taken any OTC medications at home. She reports sick contacts at home. She denies inability to swallow, fever.   History reviewed. No pertinent past medical history. Past Surgical History  Procedure Laterality Date  . Cesarean section     Family History  Problem Relation Age of Onset  . Hypertension Mother    Social History  Substance Use Topics  . Smoking status: Never Smoker   . Smokeless tobacco: Never Used  . Alcohol Use: No   OB History    Gravida Para Term Preterm AB TAB SAB Ectopic Multiple Living   0 1 0 1 0 0 4     Review of Systems  A complete 10 system review of systems was obtained and all systems are negative except as noted in the HPI and PMH.    Allergies  Review of patient's allergies indicates no known allergies.  Home Medications   Prior to Admission medications   Medication Sig Start Date End Date Taking? Authorizing Provider  cephALEXin (KEFLEX) 500 MG capsule Take 1 capsule (500 mg total) by mouth 4 (four) times daily. Patient not taking: Reported on 06/05/2015 12/18/14   Catha Gosselin, PA-C  fluconazole (DIFLUCAN) 150 MG tablet Take 1 tablet (150 mg total) by mouth daily. Patient not taking: Reported on 06/05/2015 08/11/13   Armando Reichert, CNM   mupirocin cream (BACTROBAN) 2 % Apply 1 application topically as needed. Patient not taking: Reported on 06/05/2015 08/11/13   Armando Reichert, CNM   BP 120/81 mmHg  Pulse 73  Temp(Src) 97.9 F (36.6 C) (Oral)  Resp 16  Ht  (1.626 m)  Wt 155 lb (70.308 kg)  BMI 26.59 kg/m2  SpO2 100%  LMP 05/28/2015 (Approximate) Physical Exam  Constitutional: She is oriented to person, place, and time. She appears well-developed and well-nourished. No distress.  HENT:  Head: Normocephalic and atraumatic.  Nose: Nose normal.  Mouth/Throat: Oropharyngeal exudate present. No posterior oropharyngeal erythema.  Patchy exudates to bilateral tonsils. No erythema   Eyes: Conjunctivae and EOM are normal. Pupils are equal, round, and reactive to light. No scleral icterus.  Neck: Normal range of motion. Neck supple. No JVD present. No tracheal deviation present. No thyromegaly present.  Cardiovascular: Normal rate, regular rhythm and normal heart sounds.  Exam reveals no gallop and no friction rub.   No murmur heard. Pulmonary/Chest: Effort normal and breath sounds normal. No respiratory distress. She has no wheezes. She exhibits no tenderness.  Abdominal: Soft. Bowel sounds are normal. She exhibits no distension and no mass. There is no tenderness. There is no rebound and no guarding.  Musculoskeletal: Normal range of motion. She exhibits no edema or tenderness.  Lymphadenopathy:    She has no cervical adenopathy.  Neurological: She is alert and oriented to person, place, and time. No cranial nerve deficit. She exhibits normal muscle tone.  Skin: Skin is warm and dry. No rash noted. No erythema. No pallor.  Nursing note and vitals reviewed.   ED Course  Procedures (including critical care time)  DIAGNOSTIC STUDIES: Oxygen Saturation is 100% on RA, normal by my interpretation.    COORDINATION OF CARE: 3:47 AM Discussed treatment plan with pt at bedside and pt agreed to plan.   Labs Review Labs  Reviewed - No data to display  Imaging Review No results found. I have personally reviewed and evaluated these images and lab results as part of my medical decision-making.   EKG Interpretation None      MDM   Final diagnoses:  None    Patient presents to the ED for sore throat and sick contacts at home with similar complaints.  PE is consistent with pharyngitis.  Will treat with decadron.  Also given motrin in the ED.  PCP fu advised within 3 days.  She appears well and in NAD. VS remain within her normal limits and she is safe for DC.  I personally performed the services described in this documentation, which was scribed in my presence. The recorded information has been reviewed and is accurate.     Tomasita CrumbleAdeleke Jami Ohlin, MD 06/05/15 [phone]

## 2015-06-05 NOTE — Discharge Instructions (Signed)
Pharyngitis Barbara Ortiz, you were given a shot for your infection.  See a primary care doctor within 3 days for close follow up.  If any symptoms worsen, come back to the ED immediately.  Thank you. Pharyngitis is a sore throat (pharynx). There is redness, pain, and swelling of your throat. HOME CARE   Drink enough fluids to keep your pee (urine) clear or pale yellow.  Only take medicine as told by your doctor.  You may get sick again if you do not take medicine as told. Finish your medicines, even if you start to feel better.  Do not take aspirin.  Rest.  Rinse your mouth (gargle) with salt water ( tsp of salt per 1 qt of water) every 1-2 hours. This will help the pain.  If you are not at risk for choking, you can suck on hard candy or sore throat lozenges. GET HELP IF:  You have large, tender lumps on your neck.  You have a rash.  You cough up green, yellow-brown, or bloody spit. GET HELP RIGHT AWAY IF:   You have a stiff neck.  You drool or cannot swallow liquids.  You throw up (vomit) or are not able to keep medicine or liquids down.  You have very bad pain that does not go away with medicine.  You have problems breathing (not from a stuffy nose). MAKE SURE YOU:   Understand these instructions.  Will watch your condition.  Will get help right away if you are not doing well or get worse.   This information is not intended to replace advice given to you by your health care provider. Make sure you discuss any questions you have with your health care provider.   Document Released: 12/20/2007 Document Revised: 04/23/2013 Document Reviewed: 03/10/2013 Elsevier Interactive Patient Education [employer]2016 Elsevier Inc.

## 2015-07-26 ENCOUNTER — Encounter (HOSPITAL_COMMUNITY): Payer: Self-pay | Admitting: Emergency Medicine

## 2015-07-26 ENCOUNTER — Emergency Department (HOSPITAL_COMMUNITY)
Admission: EM | Admit: 2015-07-26 | Discharge: 2015-07-27 | Disposition: A | Discharge status: Home / Self Care | Payer: Medicaid Other | Attending: Emergency Medicine | Admitting: Emergency Medicine

## 2015-07-26 DIAGNOSIS — Z3202 Encounter for pregnancy test, result negative: Secondary | ICD-10-CM | POA: Insufficient documentation

## 2015-07-26 DIAGNOSIS — M545 Low back pain: Secondary | ICD-10-CM | POA: Diagnosis not present

## 2015-07-26 DIAGNOSIS — R102 Pelvic and perineal pain: Secondary | ICD-10-CM | POA: Diagnosis present

## 2015-07-26 DIAGNOSIS — N72 Inflammatory disease of cervix uteri: Secondary | ICD-10-CM

## 2015-07-26 LAB — URINALYSIS, ROUTINE W REFLEX MICROSCOPIC
Bilirubin Urine: NEGATIVE
Glucose, UA: NEGATIVE mg/dL
KETONES UR: NEGATIVE mg/dL
LEUKOCYTES UA: NEGATIVE
NITRITE: NEGATIVE
PH: 7 (ref 5.0–8.0)
Protein, ur: NEGATIVE mg/dL
SPECIFIC GRAVITY, URINE: 1.015 (ref 1.005–1.030)

## 2015-07-26 LAB — WET PREP, GENITAL
Sperm: NONE SEEN
TRICH WET PREP: NONE SEEN
YEAST WET PREP: NONE SEEN

## 2015-07-26 LAB — I-STAT BETA HCG BLOOD, ED (MC, WL, AP ONLY)

## 2015-07-26 LAB — URINE MICROSCOPIC-ADD ON: WBC UA: NONE SEEN WBC/hpf (ref 0–5)

## 2015-07-26 MED ORDER — AZITHROMYCIN 250 MG PO TABS
1000.0000 mg | ORAL_TABLET | Freq: Once | ORAL | Status: AC
  Administered 2015-07-27: 1000 mg via ORAL
  Filled 2015-07-26: qty 4

## 2015-07-26 MED ORDER — CEFTRIAXONE SODIUM 250 MG IJ SOLR
250.0000 mg | Freq: Once | INTRAMUSCULAR | Status: AC
  Administered 2015-07-27: 250 mg via INTRAMUSCULAR
  Filled 2015-07-26: qty 250

## 2015-07-26 NOTE — ED Notes (Signed)
Pt from home for eval of lower back pain and pelvic pain x3 days, denies any injury or urinary symptoms. Denies any vaginal discharge or odor. Pt LMP 2 weeks ago, nad noted. Denies any emesis or diarrhea but reports lack of appetite.

## 2015-07-26 NOTE — ED Provider Notes (Signed)
CSN: [id number]     Arrival date & time 07/26/15  2210 History   First MD Initiated Contact with Patient 07/26/15 2252     Chief Complaint  Patient presents with  . Pelvic Pain  . Back Pain   Patient is a 25 y.o. female presenting with pelvic pain and back pain. The history is provided by the patient.  Pelvic Pain  Back Pain Associated symptoms: pelvic pain    patient presents to emergency room with complaints of lower back and pelvic pain started about 3 days ago. Pain is intermittent.  she feels a sharp pain in the vaginal area. Denies any dysuria or urinary frequency. She has not had any fevers or chills. No vomiting or diarrhea. No change in her appetite. No vaginal odor or discharge. The patient has had a new sexual partner. She denies any condom use.  History reviewed. No pertinent past medical history. Past Surgical History  Procedure Laterality Date  . Cesarean section     Family History  Problem Relation Age of Onset  . Hypertension Mother    Social History  Substance Use Topics  . Smoking status: Never Smoker   . Smokeless tobacco: Never Used  . Alcohol Use: No   OB History    Gravida Para Term Preterm AB TAB SAB Ectopic Multiple Living   5 4 4  0 1 0 1 0 0 4     Review of Systems  Genitourinary: Positive for pelvic pain.  Musculoskeletal: Positive for back pain.  All other systems reviewed and are negative.     Allergies  Review of patient's allergies indicates no known allergies.  Home Medications   Prior to Admission medications   Medication Sig Start Date End Date Taking? Authorizing Provider  cephALEXin (KEFLEX) 500 MG capsule Take 1 capsule (500 mg total) by mouth 4 (four) times daily. Patient not taking: Reported on 06/05/2015 12/18/14   Catha GosselinHanna Patel-Mills, PA-C  fluconazole (DIFLUCAN) 150 MG tablet Take 1 tablet (150 mg total) by mouth daily. Patient not taking: Reported on 06/05/2015 08/11/13   Armando ReichertHeather D Hogan, CNM  mupirocin cream (BACTROBAN) 2 %  Apply 1 application topically as needed. Patient not taking: Reported on 06/05/2015 08/11/13   Armando ReichertHeather D Hogan, CNM   BP 113/56 mmHg  Pulse 66  Temp(Src) 98 F (36.7 C) (Oral)  Resp 16  Ht 5\' 4"  (1.626 m)  Wt 73.211 kg  BMI 27.69 kg/m2  SpO2 100%  LMP 07/12/2015 Physical Exam  Constitutional: She appears well-developed and well-nourished. No distress.  HENT:  Head: Normocephalic and atraumatic.  Right Ear: External ear normal.  Left Ear: External ear normal.  Eyes: Conjunctivae are normal. Right eye exhibits no discharge. Left eye exhibits no discharge. No scleral icterus.  Neck: Neck supple. No tracheal deviation present.  Cardiovascular: Normal rate, regular rhythm and intact distal pulses.   Pulmonary/Chest: Effort normal and breath sounds normal. No stridor. No respiratory distress. She has no wheezes. She has no rales.  Abdominal: Soft. Bowel sounds are normal. She exhibits no distension. There is no tenderness. There is no rebound and no guarding.  Genitourinary: Uterus is not tender. Cervix exhibits motion tenderness and discharge. Right adnexum displays tenderness. Right adnexum displays no mass. Left adnexum displays no mass and no tenderness.  Musculoskeletal: She exhibits no edema or tenderness.  Neurological: She is alert. She has normal strength. No cranial nerve deficit (no facial droop, extraocular movements intact, no slurred speech) or sensory deficit. She exhibits normal muscle tone.  She displays no seizure activity. Coordination normal.  Skin: Skin is warm and dry. No rash noted.  Psychiatric: She has a normal mood and affect.  Nursing note and vitals reviewed.   ED Course  Procedures (including critical care time) Labs Review Labs Reviewed  WET PREP, GENITAL - Abnormal; Notable for the following:    Clue Cells Wet Prep HPF POC PRESENT (*)    WBC, Wet Prep HPF POC TOO NUMEROUS TO COUNT (*)    All other components within normal limits  URINALYSIS, ROUTINE W  REFLEX MICROSCOPIC (NOT AT [facility]) - Abnormal; Notable for the following:    Hgb urine dipstick TRACE (*)    All other components within normal limits  URINE MICROSCOPIC-ADD ON - Abnormal; Notable for the following:    Squamous Epithelial / LPF 0-5 (*)    Bacteria, UA RARE (*)    All other components within normal limits  RPR  HIV ANTIBODY (ROUTINE TESTING)  I-STAT BETA HCG BLOOD, ED ([facility], [facility], [facility] ONLY)  GC/CHLAMYDIA PROBE AMP ([place]) NOT AT [facility]   Medications  cefTRIAXone (ROCEPHIN) injection 250 mg (250 mg Intramuscular Given 07/27/15 0031)  azithromycin (ZITHROMAX) tablet 1,000 mg (1,000 mg Oral Given 07/27/15 0031)  lidocaine (PF) (XYLOCAINE) 1 % injection (5 mLs  Given 07/27/15 0031)     MDM   Final diagnoses:  Cervicitis    Sometimes are suggestive of a cervicitis.  I have ordered ceftriaxone and azithromycin.  Avoid sexual intercourse for one week. Patient's partner should also be evaluated.    Linwood Dibbles, MD 07/27/15 [phone]

## 2015-07-27 LAB — GC/CHLAMYDIA PROBE AMP (~~LOC~~) NOT AT ARMC
CHLAMYDIA, DNA PROBE: NEGATIVE
Neisseria Gonorrhea: NEGATIVE

## 2015-07-27 LAB — RPR: RPR: NONREACTIVE

## 2015-07-27 LAB — HIV ANTIBODY (ROUTINE TESTING W REFLEX): HIV Screen 4th Generation wRfx: NONREACTIVE

## 2015-07-27 MED ORDER — LIDOCAINE HCL (PF) 1 % IJ SOLN
INTRAMUSCULAR | Status: AC
  Administered 2015-07-27: 5 mL
  Filled 2015-07-27: qty 5

## 2015-07-27 NOTE — Discharge Instructions (Signed)
Cervicitis °Cervicitis is a soreness and puffiness (inflammation) of the cervix.  °HOME CARE °· Do not have sex (intercourse) until your doctor says it is okay. °· Do not have sex until your partner is treated or as told by your doctor. °· Take your antibiotic medicine as told. Finish it even if you start to feel better. °GET HELP IF:  °· Your symptoms that brought you to the doctor come back. °· You have a fever. °MAKE SURE YOU:  °· Understand these instructions. °· Will watch your condition. °· Will get help right away if you are not doing well or get worse. °  °This information is not intended to replace advice given to you by your health care provider. Make sure you discuss any questions you have with your health care provider. °  °Document Released: 04/11/2008 Document Revised: 07/08/2013 Document Reviewed: 12/25/2012 °Elsevier Interactive Patient Education ©2016 Elsevier Inc. ° °

## 2015-08-03 ENCOUNTER — Encounter: Payer: Medicaid Other | Admitting: Student

## 2015-08-05 ENCOUNTER — Encounter: Payer: Medicaid Other | Admitting: Obstetrics & Gynecology

## 2015-08-06 ENCOUNTER — Other Ambulatory Visit (HOSPITAL_COMMUNITY)
Admission: RE | Admit: 2015-08-06 | Discharge: 2015-08-06 | Disposition: A | Discharge status: Home / Self Care | Payer: Medicaid Other | Source: Ambulatory Visit | Attending: Obstetrics & Gynecology | Admitting: Obstetrics & Gynecology

## 2015-08-06 ENCOUNTER — Ambulatory Visit (INDEPENDENT_AMBULATORY_CARE_PROVIDER_SITE_OTHER): Payer: Medicaid Other | Admitting: Obstetrics & Gynecology

## 2015-08-06 ENCOUNTER — Encounter: Payer: Self-pay | Admitting: Obstetrics & Gynecology

## 2015-08-06 VITALS — BP 123/74 | HR 73 | Ht 64.0 in | Wt 158.7 lb

## 2015-08-06 DIAGNOSIS — Z124 Encounter for screening for malignant neoplasm of cervix: Secondary | ICD-10-CM | POA: Diagnosis not present

## 2015-08-06 DIAGNOSIS — Z0000 Encounter for general adult medical examination without abnormal findings: Secondary | ICD-10-CM

## 2015-08-06 DIAGNOSIS — Z1151 Encounter for screening for human papillomavirus (HPV): Secondary | ICD-10-CM | POA: Insufficient documentation

## 2015-08-06 DIAGNOSIS — Z01411 Encounter for gynecological examination (general) (routine) with abnormal findings: Secondary | ICD-10-CM | POA: Insufficient documentation

## 2015-08-06 DIAGNOSIS — N852 Hypertrophy of uterus: Secondary | ICD-10-CM | POA: Diagnosis present

## 2015-08-06 NOTE — Progress Notes (Signed)
   Subjective:    Patient ID: Barbara Ortiz, female    DOB: [birth year], 25 y.o.   MRN: [id number]  HPI  25 yo S AA P4 here for ER follow up. She was seen at the ER 07-26-15 for pelvic pain. She was diagnosed with cervicitis and given IM rocephin and po zithromax. She feels much better. Her cultures were negative.   Review of Systems She uses withdrawal for contraception. She did not like the Mirena in the past.    Objective:   Physical Exam  WNWHBFNAD Breathing, conversing, and ambulating normally Cervix- appears normal, no discharge, no CMT Her uterus is globular, about 8 week size, somewhat boggy QBHCG negative at the ER      Assessment & Plan:  Cervicitis- resolved Boggy enlarged uterus- asymptomatic, I will get a baseline gyn u/s

## 2015-08-09 LAB — CYTOLOGY - PAP

## 2015-08-11 ENCOUNTER — Ambulatory Visit (HOSPITAL_COMMUNITY)
Admission: RE | Admit: 2015-08-11 | Discharge: 2015-08-11 | Disposition: A | Discharge status: Home / Self Care | Payer: Medicaid Other | Source: Ambulatory Visit | Attending: Obstetrics & Gynecology | Admitting: Obstetrics & Gynecology

## 2015-08-11 DIAGNOSIS — N852 Hypertrophy of uterus: Secondary | ICD-10-CM | POA: Diagnosis present

## 2015-08-12 ENCOUNTER — Telehealth: Payer: Self-pay | Admitting: *Deleted

## 2015-08-12 NOTE — Telephone Encounter (Signed)
Called pt and informed her of abnormal Pap requiring Colposcopy.  Procedure explained briefly. Appt date and time given - 2/17 @ 0930. Pt voiced understanding.

## 2015-09-03 ENCOUNTER — Encounter (INDEPENDENT_AMBULATORY_CARE_PROVIDER_SITE_OTHER): Payer: Medicaid Other | Admitting: Obstetrics & Gynecology

## 2015-09-03 NOTE — Progress Notes (Signed)
   Subjective:    Patient ID: Barbara Ortiz, female    DOB: 1990/10/28, 25 y.o.   MRN: [id number]  HPI She is here for a colpo but is having heavy bleeding with clots with her period today.   Review of Systems     Objective:   Physical Exam        Assessment & Plan:  She will reschedule when she is not having a heavy period.

## 2015-09-13 ENCOUNTER — Telehealth: Payer: Self-pay | Admitting: General Practice

## 2015-09-13 NOTE — Telephone Encounter (Signed)
Patient called and left message stating over the weekend she has been having hip pain & pain in her vagina. Patient states it comes on real strong at times. Called patient and asked if she was due for her period or not. Patient states no and she recently got her period twice. Asked patient if she had taken anything and she states one extra strength tylenol which did not help. Discussed with patient that last labs for STDs were negative and her ultrasound looked good. Recommended she try a couple doses of ibuprofen 600-800mg  every 8 hours as needed. Patient verbalized understanding & had no other questions.

## 2015-09-22 ENCOUNTER — Encounter: Payer: Self-pay | Admitting: Obstetrics & Gynecology

## 2015-09-22 ENCOUNTER — Ambulatory Visit (INDEPENDENT_AMBULATORY_CARE_PROVIDER_SITE_OTHER): Payer: Medicaid Other | Admitting: Obstetrics & Gynecology

## 2015-09-22 VITALS — BP 141/89 | HR 77 | Temp 98.4°F | Ht 63.0 in | Wt 162.0 lb

## 2015-09-22 DIAGNOSIS — B977 Papillomavirus as the cause of diseases classified elsewhere: Secondary | ICD-10-CM

## 2015-09-22 DIAGNOSIS — R87611 Atypical squamous cells cannot exclude high grade squamous intraepithelial lesion on cytologic smear of cervix (ASC-H): Secondary | ICD-10-CM | POA: Diagnosis not present

## 2015-09-22 DIAGNOSIS — Z3201 Encounter for pregnancy test, result positive: Secondary | ICD-10-CM

## 2015-09-22 NOTE — Progress Notes (Signed)
Scheduled pt OB US for March 17th @ 1400.  Pt notified.

## 2015-09-22 NOTE — Progress Notes (Signed)
   Subjective:    Patient ID: Barbara Ortiz, female    DOB: 1990/09/13, 25 y.o.   MRN: [id number]  HPI 25 yo AA P4 is here for a colpo. 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 She is not using contraception but was not trying to conceive. She rescheduled her colpo 2 weeks ago due to her "period".  Review of Systems     Objective:   Physical Exam UPT + Bimanual exam- uterus about 8 week size  UPT negative, consent signed, time out done Cervix prepped with acetic acid. Transformation zone seen in its entirety. Colpo adequate. 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     Assessment & Plan:  ASCUS, r/o HGSIL- colpo c/w LGSIL- repeat pap/maybe colpo postpartum Early OB- schedule u/s for dating Rec MVIs daily She plans to get her prenatal care here.

## 2015-09-23 LAB — POCT PREGNANCY, URINE: Preg Test, Ur: POSITIVE — AB

## 2015-10-01 ENCOUNTER — Ambulatory Visit (HOSPITAL_COMMUNITY): Admission: RE | Admit: 2015-10-01 | Payer: Medicaid Other | Source: Ambulatory Visit

## 2015-10-01 ENCOUNTER — Other Ambulatory Visit: Payer: Self-pay | Admitting: Obstetrics & Gynecology

## 2015-10-01 DIAGNOSIS — O3680X0 Pregnancy with inconclusive fetal viability, not applicable or unspecified: Secondary | ICD-10-CM

## 2015-10-01 DIAGNOSIS — Z3687 Encounter for antenatal screening for uncertain dates: Secondary | ICD-10-CM

## 2015-10-01 DIAGNOSIS — Z3201 Encounter for pregnancy test, result positive: Secondary | ICD-10-CM

## 2015-10-04 ENCOUNTER — Inpatient Hospital Stay (HOSPITAL_COMMUNITY)
Admission: AD | Admit: 2015-10-04 | Discharge: 2015-10-05 | Disposition: A | Discharge status: Home / Self Care | Payer: Medicaid Other | Source: Ambulatory Visit | Attending: Obstetrics and Gynecology | Admitting: Obstetrics and Gynecology

## 2015-10-04 ENCOUNTER — Telehealth: Payer: Self-pay | Admitting: *Deleted

## 2015-10-04 DIAGNOSIS — R102 Pelvic and perineal pain: Secondary | ICD-10-CM | POA: Diagnosis present

## 2015-10-04 DIAGNOSIS — O26891 Other specified pregnancy related conditions, first trimester: Secondary | ICD-10-CM | POA: Diagnosis not present

## 2015-10-04 DIAGNOSIS — Z3A01 Less than 8 weeks gestation of pregnancy: Secondary | ICD-10-CM | POA: Diagnosis not present

## 2015-10-04 DIAGNOSIS — Z3490 Encounter for supervision of normal pregnancy, unspecified, unspecified trimester: Secondary | ICD-10-CM

## 2015-10-04 NOTE — Telephone Encounter (Signed)
Becka left a message on voicemail this afternoon stating she is having pain/bleeding/weakness and doesn't know if she should come see us or go to a regular doctor.  Called patient and she states she was told she was pregant when she came 09/22/15 for colposcopy.  She states she started bleeding a scant amount this morning like she does when her period starts. She c/o weakness and sudden , sharp pain in her lower pelvic that is an 8/10.she missed her follow up ultrasound scheduled 10/01/15.  Advised her to come to [facility] for evaluation. She voices understanding.

## 2015-10-04 NOTE — [facility] Note (Signed)
Pt states LMP 02/20, and had a positive preg test in the clinic. States she started having some bleeding and some lower abd pain. Also reports a off/on sharp pain in her right hip today.

## 2015-10-05 ENCOUNTER — Inpatient Hospital Stay (HOSPITAL_COMMUNITY): Payer: Medicaid Other

## 2015-10-05 ENCOUNTER — Telehealth: Payer: Self-pay | Admitting: Obstetrics & Gynecology

## 2015-10-05 ENCOUNTER — Encounter (HOSPITAL_COMMUNITY): Payer: Self-pay | Admitting: *Deleted

## 2015-10-05 DIAGNOSIS — R102 Pelvic and perineal pain: Secondary | ICD-10-CM

## 2015-10-05 DIAGNOSIS — O26891 Other specified pregnancy related conditions, first trimester: Secondary | ICD-10-CM | POA: Diagnosis not present

## 2015-10-05 LAB — URINALYSIS, ROUTINE W REFLEX MICROSCOPIC
Bilirubin Urine: NEGATIVE
GLUCOSE, UA: NEGATIVE mg/dL
Ketones, ur: 15 mg/dL — AB
LEUKOCYTES UA: NEGATIVE
Nitrite: NEGATIVE
PROTEIN: NEGATIVE mg/dL
pH: 5.5 (ref 5.0–8.0)

## 2015-10-05 LAB — CBC
HEMATOCRIT: 34.1 % — AB (ref 36.0–46.0)
HEMOGLOBIN: 11.6 g/dL — AB (ref 12.0–15.0)
MCH: 27.9 pg (ref 26.0–34.0)
MCHC: 34 g/dL (ref 30.0–36.0)
MCV: 82 fL (ref 78.0–100.0)
Platelets: 251 10*3/uL (ref 150–400)
RBC: 4.16 MIL/uL (ref 3.87–5.11)
RDW: 13.5 % (ref 11.5–15.5)
WBC: 6.6 10*3/uL (ref 4.0–10.5)

## 2015-10-05 LAB — URINE MICROSCOPIC-ADD ON

## 2015-10-05 LAB — HIV ANTIBODY (ROUTINE TESTING W REFLEX): HIV SCREEN 4TH GENERATION: NONREACTIVE

## 2015-10-05 LAB — WET PREP, GENITAL
SPERM: NONE SEEN
TRICH WET PREP: NONE SEEN
WBC, Wet Prep HPF POC: NONE SEEN
Yeast Wet Prep HPF POC: NONE SEEN

## 2015-10-05 LAB — RPR: RPR Ser Ql: NONREACTIVE

## 2015-10-05 LAB — ABO/RH: ABO/RH(D): O POS

## 2015-10-05 LAB — HCG, QUANTITATIVE, PREGNANCY: HCG, BETA CHAIN, QUANT, S: 17689 m[IU]/mL — AB (ref <5)

## 2015-10-05 MED ORDER — PROMETHAZINE HCL 25 MG PO TABS: 12.5000 mg | ORAL_TABLET | Freq: Four times a day (QID) | ORAL | Status: DC | PRN

## 2015-10-05 NOTE — Discharge Instructions (Signed)
First Trimester of Pregnancy The first trimester of pregnancy is from week 1 until the end of week 12 (months 1 through 3). A week after a sperm fertilizes an egg, the egg will implant on the wall of the uterus. This embryo will begin to develop into a baby. Genes from you and your partner are forming the baby. The female genes determine whether the baby is a boy or a girl. At 6-8 weeks, the eyes and face are formed, and the heartbeat can be seen on ultrasound. At the end of 12 weeks, all the baby's organs are formed.  Now that you are pregnant, you will want to do everything you can to have a healthy baby. Two of the most important things are to get good prenatal care and to follow your health care provider's instructions. Prenatal care is all the medical care you receive before the baby's birth. This care will help prevent, find, and treat any problems during the pregnancy and childbirth. BODY CHANGES Your body goes through many changes during pregnancy. The changes vary from woman to woman.   You may gain or lose a couple of pounds at first.  You may feel sick to your stomach (nauseous) and throw up (vomit). If the vomiting is uncontrollable, call your health care provider.  You may tire easily.  You may develop headaches that can be relieved by medicines approved by your health care provider.  You may urinate more often. Painful urination may mean you have a bladder infection.  You may develop heartburn as a result of your pregnancy.  You may develop constipation because certain hormones are causing the muscles that push waste through your intestines to slow down.  You may develop hemorrhoids or swollen, bulging veins (varicose veins).  Your breasts may begin to grow larger and become tender. Your nipples may stick out more, and the tissue that surrounds them (areola) may become darker.  Your gums may bleed and may be sensitive to brushing and flossing.  Dark spots or blotches (chloasma,  mask of pregnancy) may develop on your face. This will likely fade after the baby is born.  Your menstrual periods will stop.  You may have a loss of appetite.  You may develop cravings for certain kinds of food.  You may have changes in your emotions from day to day, such as being excited to be pregnant or being concerned that something may go wrong with the pregnancy and baby.  You may have more vivid and strange dreams.  You may have changes in your hair. These can include thickening of your hair, rapid growth, and changes in texture. Some women also have hair loss during or after pregnancy, or hair that feels dry or thin. Your hair will most likely return to normal after your baby is born. WHAT TO EXPECT AT YOUR PRENATAL VISITS During a routine prenatal visit:  You will be weighed to make sure you and the baby are growing normally.  Your blood pressure will be taken.  Your abdomen will be measured to track your baby's growth.  The fetal heartbeat will be listened to starting around week 10 or 12 of your pregnancy.  Test results from any previous visits will be discussed. Your health care provider may ask you:  How you are feeling.  If you are feeling the baby move.  If you have had any abnormal symptoms, such as leaking fluid, bleeding, severe headaches, or abdominal cramping.  If you are using any tobacco products,   including cigarettes, chewing tobacco, and electronic cigarettes.  If you have any questions. Other tests that may be performed during your first trimester include:  Blood tests to find your blood type and to check for the presence of any previous infections. They will also be used to check for low iron levels (anemia) and Rh antibodies. Later in the pregnancy, blood tests for diabetes will be done along with other tests if problems develop.  Urine tests to check for infections, diabetes, or protein in the urine.  An ultrasound to confirm the proper growth  and development of the baby.  An amniocentesis to check for possible genetic problems.  Fetal screens for spina bifida and Down syndrome.  You may need other tests to make sure you and the baby are doing well.  HIV (human immunodeficiency virus) testing. Routine prenatal testing includes screening for HIV, unless you choose not to have this test. HOME CARE INSTRUCTIONS  Medicines  Follow your health care provider's instructions regarding medicine use. Specific medicines may be either safe or unsafe to take during pregnancy.  Take your prenatal vitamins as directed.  If you develop constipation, try taking a stool softener if your health care provider approves. Diet  Eat regular, well-balanced meals. Choose a variety of foods, such as meat or vegetable-based protein, fish, milk and low-fat dairy products, vegetables, fruits, and whole grain breads and cereals. Your health care provider will help you determine the amount of weight gain that is right for you.  Avoid raw meat and uncooked cheese. These carry germs that can cause birth defects in the baby.  Eating four or five small meals rather than three large meals a day may help relieve nausea and vomiting. If you start to feel nauseous, eating a few soda crackers can be helpful. Drinking liquids between meals instead of during meals also seems to help nausea and vomiting.  If you develop constipation, eat more high-fiber foods, such as fresh vegetables or fruit and whole grains. Drink enough fluids to keep your urine clear or pale yellow. Activity and Exercise  Exercise only as directed by your health care provider. Exercising will help you:  Control your weight.  Stay in shape.  Be prepared for labor and delivery.  Experiencing pain or cramping in the lower abdomen or low back is a good sign that you should stop exercising. Check with your health care provider before continuing normal exercises.  Try to avoid standing for long  periods of time. Move your legs often if you must stand in one place for a long time.  Avoid heavy lifting.  Wear low-heeled shoes, and practice good posture.  You may continue to have sex unless your health care provider directs you otherwise. Relief of Pain or Discomfort  Wear a good support bra for breast tenderness.   Take warm sitz baths to soothe any pain or discomfort caused by hemorrhoids. Use hemorrhoid cream if your health care provider approves.   Rest with your legs elevated if you have leg cramps or low back pain.  If you develop varicose veins in your legs, wear support hose. Elevate your feet for 15 minutes, 3-4 times a day. Limit salt in your diet. Prenatal Care  Schedule your prenatal visits by the twelfth week of pregnancy. They are usually scheduled monthly at first, then more often in the last 2 months before delivery.  Write down your questions. Take them to your prenatal visits.  Keep all your prenatal visits as directed by your   health care provider. Safety  Wear your seat belt at all times when driving.  Make a list of emergency phone numbers, including numbers for family, friends, the hospital, and police and fire departments. General Tips  Ask your health care provider for a referral to a local prenatal education class. Begin classes no later than at the beginning of month 6 of your pregnancy.  Ask for help if you have counseling or nutritional needs during pregnancy. Your health care provider can offer advice or refer you to specialists for help with various needs.  Do not use hot tubs, steam rooms, or saunas.  Do not douche or use tampons or scented sanitary pads.  Do not cross your legs for long periods of time.  Avoid cat litter boxes and soil used by cats. These carry germs that can cause birth defects in the baby and possibly loss of the fetus by miscarriage or stillbirth.  Avoid all smoking, herbs, alcohol, and medicines not prescribed by  your health care provider. Chemicals in these affect the formation and growth of the baby.  Do not use any tobacco products, including cigarettes, chewing tobacco, and electronic cigarettes. If you need help quitting, ask your health care provider. You may receive counseling support and other resources to help you quit.  Schedule a dentist appointment. At home, brush your teeth with a soft toothbrush and be gentle when you floss. SEEK MEDICAL CARE IF:   You have dizziness.  You have mild pelvic cramps, pelvic pressure, or nagging pain in the abdominal area.  You have persistent nausea, vomiting, or diarrhea.  You have a bad smelling vaginal discharge.  You have pain with urination.  You notice increased swelling in your face, hands, legs, or ankles. SEEK IMMEDIATE MEDICAL CARE IF:   You have a fever.  You are leaking fluid from your vagina.  You have spotting or bleeding from your vagina.  You have severe abdominal cramping or pain.  You have rapid weight gain or loss.  You vomit blood or material that looks like coffee grounds.  You are exposed to German measles and have never had them.  You are exposed to fifth disease or chickenpox.  You develop a severe headache.  You have shortness of breath.  You have any kind of trauma, such as from a fall or a car accident.   This information is not intended to replace advice given to you by your health care provider. Make sure you discuss any questions you have with your health care provider.   Document Released: 06/27/2001 Document Revised: 07/24/2014 Document Reviewed: 05/13/2013 Elsevier Interactive Patient Education 2016 [employer] Inc.  

## 2015-10-05 NOTE — MAU Provider Note (Signed)
History     CSN: [id number]  Arrival date and time: 10/04/15 2333   First Provider Initiated Contact with Patient 10/05/15 0029      Chief Complaint  Patient presents with  . Pelvic Pain   Pelvic Pain The patient's primary symptoms include pelvic pain and vaginal bleeding. This is a new problem. The current episode started today. The problem occurs intermittently. The problem has been gradually worsening. Pain severity now: 8/10  The problem affects the right side. She is pregnant. Associated symptoms include abdominal pain. Pertinent negatives include no chills, constipation, diarrhea, dysuria, fever, frequency, nausea, urgency or vomiting. The vaginal discharge was bloody. The vaginal bleeding is spotting. She has not been passing clots. She has not been passing tissue. Nothing aggravates the symptoms. She has tried NSAIDs for the symptoms. The treatment provided no relief. She is sexually active. She uses nothing for contraception. Menstrual history: LMP: 09/06/15    History reviewed. No pertinent past medical history.  Past Surgical History  Procedure Laterality Date  . Cesarean section      Family History  Problem Relation Age of Onset  . Hypertension Mother     Social History  Substance Use Topics  . Smoking status: Never Smoker   . Smokeless tobacco: Never Used  . Alcohol Use: No    Allergies: No Known Allergies  Prescriptions prior to admission  Medication Sig Dispense Refill Last Dose  . cephALEXin (KEFLEX) 500 MG capsule Take 1 capsule (500 mg total) by mouth 4 (four) times daily. (Patient not taking: Reported on 06/05/2015) 20 capsule 0 Not Taking  . fluconazole (DIFLUCAN) 150 MG tablet Take 1 tablet (150 mg total) by mouth daily. (Patient not taking: Reported on 06/05/2015) 10 tablet 0 Not Taking  . mupirocin cream (BACTROBAN) 2 % Apply 1 application topically as needed. (Patient not taking: Reported on 06/05/2015) 15 g 0 Not Taking    Review of Systems   Constitutional: Negative for fever and chills.  Gastrointestinal: Positive for abdominal pain. Negative for nausea, vomiting, diarrhea and constipation.  Genitourinary: Positive for pelvic pain. Negative for dysuria, urgency and frequency.   Physical Exam   Blood pressure 126/69, pulse 76, temperature 98.5 F (36.9 C), temperature source Oral, resp. rate 16, height 5\' 3"  (1.6 m), weight 74.39 kg (164 lb), last menstrual period 09/06/2015, SpO2 100 %.  Physical Exam  Nursing note and vitals reviewed. Constitutional: She is oriented to person, place, and time. She appears well-developed and well-nourished. No distress.  HENT:  Head: Normocephalic.  Cardiovascular: Normal rate.   Respiratory: Effort normal.  GI: Soft. There is no tenderness. There is no rebound.  Neurological: She is alert and oriented to person, place, and time.  Skin: Skin is warm and dry.  Psychiatric: She has a normal mood and affect.   Results for orders placed or performed during the hospital encounter of 10/04/15 (from the past 24 hour(s))  Urinalysis, Routine w reflex microscopic (not [facility])     Status: Abnormal   Collection Time: 10/04/15 11:59 PM  Result Value Ref Range   Color, Urine YELLOW YELLOW   APPearance CLEAR CLEAR   Specific Gravity, Urine >1.030 (H) 1.005 - 1.030   pH 5.5 5.0 - 8.0   Glucose, UA NEGATIVE NEGATIVE mg/dL   Hgb urine dipstick MODERATE (A) NEGATIVE   Bilirubin Urine NEGATIVE NEGATIVE   Ketones, ur 15 (A) NEGATIVE mg/dL   Protein, ur NEGATIVE NEGATIVE mg/dL   Nitrite NEGATIVE NEGATIVE   Leukocytes, UA NEGATIVE  NEGATIVE  Urine microscopic-add on     Status: Abnormal   Collection Time: 10/04/15 11:59 PM  Result Value Ref Range   Squamous Epithelial / LPF 0-5 (A) NONE SEEN   WBC, UA 0-5 0 - 5 WBC/hpf   RBC / HPF 0-5 0 - 5 RBC/hpf   Bacteria, UA FEW (A) NONE SEEN   Urine-Other MUCOUS PRESENT   Wet prep, genital     Status: Abnormal   Collection Time: 10/05/15 12:18 AM  Result  Value Ref Range   Yeast Wet Prep HPF POC NONE SEEN NONE SEEN   Trich, Wet Prep NONE SEEN NONE SEEN   Clue Cells Wet Prep HPF POC PRESENT (A) NONE SEEN   WBC, Wet Prep HPF POC NONE SEEN NONE SEEN   Sperm NONE SEEN   CBC     Status: Abnormal   Collection Time: 10/05/15 12:39 AM  Result Value Ref Range   WBC 6.6 4.0 - 10.5 K/uL   RBC 4.16 3.87 - 5.11 MIL/uL   Hemoglobin 11.6 (L) 12.0 - 15.0 g/dL   HCT 16.1 (L) 09.6 - 04.5 %   MCV 82.0 78.0 - 100.0 fL   MCH 27.9 26.0 - 34.0 pg   MCHC 34.0 30.0 - 36.0 g/dL   RDW 40.9 81.1 - 91.4 %   Platelets 251 150 - 400 K/uL  ABO/Rh     Status: None (Preliminary result)   Collection Time: 10/05/15 12:39 AM  Result Value Ref Range   ABO/RH(D) O POS    US Ob Comp Less 14 Wks  10/05/2015  CLINICAL DATA:  Vaginal bleeding and lower abdominal pain today. Quantitative beta HCG is in progress. Estimated gestational age by LMP is 4 weeks 1 day. EXAM: OBSTETRIC <14 WK [place] AND TRANSVAGINAL OB US TECHNIQUE: Both transabdominal and transvaginal ultrasound examinations were performed for complete evaluation of the gestation as well as the maternal uterus, adnexal regions, and pelvic cul-de-sac. Transvaginal technique was performed to assess early pregnancy. COMPARISON:  None. FINDINGS: Intrauterine gestational sac: A single intrauterine gestational sac is present. Yolk sac:  Yolk sac is visualized. Embryo:  Fetal pole is not identified. Cardiac Activity: Not identified. MSD: 9.1  mm   5 w   5  d Subchorionic hemorrhage: Small subchorionic hemorrhages identified superiorly. Maternal uterus/adnexae: Uterus is anteverted. No myometrial mass lesions identified. Small nabothian cysts in the cervix. Both ovaries are identified and appear normal. No abnormal adnexal masses. No free fluid collections. IMPRESSION: Probable early intrauterine gestational sac with yolk sac, but no fetal pole or cardiac activity yet visualized. Based on mean sac diameter, estimated gestational age is 5  weeks 5 days. Small subchorionic hemorrhage. Recommend follow-up quantitative B-HCG levels and follow-up US in 14 days to confirm and assess viability. This recommendation follows SRU consensus guidelines: Diagnostic Criteria for Nonviable Pregnancy Early in the First Trimester. Malva Limes Med 2013; 782:9562-13. Electronically Signed   By: Burman Nieves M.D.   On: 10/05/2015 01:29   US Ob Transvaginal  10/05/2015  CLINICAL DATA:  Vaginal bleeding and lower abdominal pain today. Quantitative beta HCG is in progress. Estimated gestational age by LMP is 4 weeks 1 day. EXAM: OBSTETRIC <14 WK [place] AND TRANSVAGINAL OB US TECHNIQUE: Both transabdominal and transvaginal ultrasound examinations were performed for complete evaluation of the gestation as well as the maternal uterus, adnexal regions, and pelvic cul-de-sac. Transvaginal technique was performed to assess early pregnancy. COMPARISON:  None. FINDINGS: Intrauterine gestational sac: A single intrauterine gestational sac is  present. Yolk sac:  Yolk sac is visualized. Embryo:  Fetal pole is not identified. Cardiac Activity: Not identified. MSD: 9.1  mm   5 w   5  d Subchorionic hemorrhage: Small subchorionic hemorrhages identified superiorly. Maternal uterus/adnexae: Uterus is anteverted. No myometrial mass lesions identified. Small nabothian cysts in the cervix. Both ovaries are identified and appear normal. No abnormal adnexal masses. No free fluid collections. IMPRESSION: Probable early intrauterine gestational sac with yolk sac, but no fetal pole or cardiac activity yet visualized. Based on mean sac diameter, estimated gestational age is 5 weeks 5 days. Small subchorionic hemorrhage. Recommend follow-up quantitative B-HCG levels and follow-up US in 14 days to confirm and assess viability. This recommendation follows SRU consensus guidelines: Diagnostic Criteria for Nonviable Pregnancy Early in the First Trimester. Malva Limes Med 2013; 086:5784-69. Electronically  Signed   By: Burman Nieves M.D.   On: 10/05/2015 01:29    MAU Course  Procedures  MDM   Assessment and Plan   1. Intrauterine pregnancy   2. Pelvic pain affecting pregnancy in first trimester, antepartum   3. [redacted] weeks gestation of pregnancy    DC home Comfort measures reviewed  1st Trimester precautions  Bleeding precautions RX: phenergan PRN #[id number]  Return to MAU as needed FU with OB as planned  Follow-up Information    Follow up with [facility].   Specialty:  Obstetrics and Gynecology   Why:  As scheduled   Contact information:   [address] [place] [address] [phone]        Tawnya Crook 10/05/2015, 12:31 AM

## 2015-10-05 NOTE — Telephone Encounter (Signed)
Called patient to give [facility]. She stated Dr. Marice Potterove told her dhe would see her for her prenatal care. I [facility], because Dr. Marice Potterove doesn't see patients in this office.

## 2015-10-06 LAB — GC/CHLAMYDIA PROBE AMP (~~LOC~~) NOT AT ARMC
CHLAMYDIA, DNA PROBE: NEGATIVE
NEISSERIA GONORRHEA: NEGATIVE

## 2015-10-08 ENCOUNTER — Ambulatory Visit (HOSPITAL_COMMUNITY): Payer: Medicaid Other

## 2015-10-19 ENCOUNTER — Encounter: Payer: Self-pay | Admitting: Certified Nurse Midwife

## 2015-10-22 ENCOUNTER — Encounter: Payer: Self-pay | Admitting: Certified Nurse Midwife

## 2015-12-16 ENCOUNTER — Inpatient Hospital Stay (HOSPITAL_COMMUNITY)
Admission: AD | Admit: 2015-12-16 | Discharge: 2015-12-17 | Disposition: A | Discharge status: Home / Self Care | Payer: Medicaid Other | Source: Ambulatory Visit | Attending: Obstetrics & Gynecology | Admitting: Obstetrics & Gynecology

## 2015-12-16 ENCOUNTER — Encounter (HOSPITAL_COMMUNITY): Payer: Self-pay | Admitting: *Deleted

## 2015-12-16 DIAGNOSIS — Z3A14 14 weeks gestation of pregnancy: Secondary | ICD-10-CM | POA: Insufficient documentation

## 2015-12-16 DIAGNOSIS — O021 Missed abortion: Secondary | ICD-10-CM | POA: Diagnosis not present

## 2015-12-16 DIAGNOSIS — O4692 Antepartum hemorrhage, unspecified, second trimester: Secondary | ICD-10-CM | POA: Diagnosis present

## 2015-12-16 DIAGNOSIS — O034 Incomplete spontaneous abortion without complication: Secondary | ICD-10-CM | POA: Diagnosis not present

## 2015-12-16 DIAGNOSIS — O209 Hemorrhage in early pregnancy, unspecified: Secondary | ICD-10-CM

## 2015-12-16 LAB — URINE MICROSCOPIC-ADD ON: WBC, UA: NONE SEEN WBC/hpf (ref 0–5)

## 2015-12-16 LAB — URINALYSIS, ROUTINE W REFLEX MICROSCOPIC
Bilirubin Urine: NEGATIVE
GLUCOSE, UA: NEGATIVE mg/dL
Ketones, ur: NEGATIVE mg/dL
Leukocytes, UA: NEGATIVE
Nitrite: NEGATIVE
PH: 6 (ref 5.0–8.0)
Protein, ur: NEGATIVE mg/dL

## 2015-12-16 LAB — POCT PREGNANCY, URINE: PREG TEST UR: POSITIVE — AB

## 2015-12-16 NOTE — MAU Note (Signed)
Pt reports she miscarried 7 weeks ago, states she was never seen during that time however the bleeding has never stopped. Occasionally has lower abd pain.

## 2015-12-17 ENCOUNTER — Inpatient Hospital Stay (HOSPITAL_COMMUNITY): Payer: Medicaid Other

## 2015-12-17 ENCOUNTER — Encounter (HOSPITAL_COMMUNITY): Payer: Self-pay | Admitting: Student

## 2015-12-17 DIAGNOSIS — O021 Missed abortion: Secondary | ICD-10-CM | POA: Diagnosis not present

## 2015-12-17 LAB — CBC
HEMATOCRIT: 32.8 % — AB (ref 36.0–46.0)
HEMOGLOBIN: 11 g/dL — AB (ref 12.0–15.0)
MCH: 27.6 pg (ref 26.0–34.0)
MCHC: 33.5 g/dL (ref 30.0–36.0)
MCV: 82.4 fL (ref 78.0–100.0)
Platelets: 305 10*3/uL (ref 150–400)
RBC: 3.98 MIL/uL (ref 3.87–5.11)
RDW: 13.7 % (ref 11.5–15.5)
WBC: 5.5 10*3/uL (ref 4.0–10.5)

## 2015-12-17 LAB — HCG, QUANTITATIVE, PREGNANCY: HCG, BETA CHAIN, QUANT, S: 31 m[IU]/mL — AB (ref <5)

## 2015-12-17 MED ORDER — PROMETHAZINE HCL 25 MG PO TABS: 25.0000 mg | ORAL_TABLET | Freq: Four times a day (QID) | ORAL | Status: DC | PRN

## 2015-12-17 MED ORDER — IBUPROFEN 600 MG PO TABS: 600.0000 mg | ORAL_TABLET | Freq: Four times a day (QID) | ORAL | Status: DC | PRN

## 2015-12-17 MED ORDER — MISOPROSTOL 200 MCG PO TABS
800.0000 ug | ORAL_TABLET | Freq: Once | ORAL | Status: AC
  Administered 2015-12-17: 800 ug via VAGINAL
  Filled 2015-12-17: qty 4

## 2015-12-17 NOTE — Discharge Instructions (Signed)
FACTS YOU SHOULD KNOW ° °WHAT IS AN EARLY PREGNANCY FAILURE? °Once the egg is fertilized with the sperm and begins to develop, it attaches to the lining of the uterus. This early pregnancy tissue may not develop into an embryo (the beginning stage of a baby). Sometimes an embryo does develop but does not continue to grow. These problems can be seen on ultrasound.  ° °MANAGEMNT OF EARLY PREGNANCY FAILURE: °About 4 out of 100 (0.25%) women will have a pregnancy loss in her lifetime.  One in five pregnancies is found to be an early pregnancy failure.  There are 3 ways to care for an early pregnancy failure:   °(1) Surgery, (2) Medicine, (3) Waiting for you to pass the pregnancy on your own. °The decision as to how to proceed after being diagnosed with and early pregnancy failure is an individual one.  The decision can be made only after appropriate counseling.  You need to weigh the pros and cons of the 3 choices. Then you can make the choice that works for you. °SURGERY (D&E) °• Procedure over in 1 day °• Requires being put to sleep °• Bleeding may be light °• Possible problems during surgery, including injury to womb(uterus) °• Care provider has more control °Medicine (CYTOTEC) °• The complete procedure may take days to weeks °• No Surgery °• Bleeding may be heavy at times °• There may be drug side effects °• Patient has more control °Waiting °• You may choose to wait, in which case your own body may complete the passing of the abnormal early pregnancy on its own in about 2-4 weeks °• Your bleeding may be heavy at times °• There is a small possibility that you may need surgery if the bleeding is too much or not all of the pregnancy has passed. °CYTOTEC MANAGEMENT °Prostaglandins (cytotec) are the most widely used drug for this purpose. They cause the uterus to cramp and contract. You will place the medicine yourself inside your vagina in the privacy of your home. Empting of the uterus should occur within 3 days but  the process may continue for several weeks. The bleeding may seem heavy at times. °POSSIBLE SIDE EFFECTS FROM CYTOTEC °• Nausea   Vomiting °• Diarrhea Fever °• Chills  Hot Flashes °Side effects  from the process of the early pregnancy failure include: °• Cramping  Bleeding °• Headaches  Dizziness °RISKS: °This is a low risk procedure. Less than 1 in 100 women has a complication. An incomplete passage of the early pregnancy may occur. Also, Hemorrhage (heavy bleeding) could happen.  Rarely the pregnancy will not be passed completely. Excessively heavy bleeding may occur.  Your doctor may need to perform surgery to empty the uterus (D&E). °Afterwards: °Everybody will feel differently after the early pregnancy completion. You may have soreness or cramps for a day or two. You may have soreness or cramps for day or two.  You may have light bleeding for up to 2 weeks. You may be as active as you feel like being. °If you have any of the following problems you may call [facility]missions [facility] at [phone]. °• If you have pain that does not get better  with pain medication °• Bleeding that soaks through 2 thick full-sized sanitary pads in an hour °• Cramps that last longer than 2 days °• Foul smelling discharge °• Fever above 100.4 degrees F °Even if you do not have any of these symptoms, you should have a follow-up exam to make sure you   are healing properly. This appointment will be made for you before you leave the hospital. Your next normal period will start again in 4-6 week after the loss. You can get pregnant soon after the loss, so use birth control right away. °Finally: °Make sure all your questions are answered before during and after any procedure. Follow up with medical care and family planning methods. ° °  ° °Pelvic Rest °Pelvic rest is sometimes recommended for women when:  °· The placenta is partially or completely covering the opening of the cervix (placenta previa). °· There is bleeding between the  uterine wall and the amniotic sac in the first trimester (subchorionic hemorrhage). °· The cervix begins to open without labor starting (incompetent cervix, cervical insufficiency). °· The labor is too early (preterm labor). °HOME CARE INSTRUCTIONS °· Do not have sexual intercourse, stimulation, or an orgasm. °· Do not use tampons, douche, or put anything in the vagina. °· Do not lift anything over 10 pounds (4.5 kg). °· Avoid strenuous activity or straining your pelvic muscles. °SEEK MEDICAL CARE IF:  °· You have any vaginal bleeding during pregnancy. Treat this as a potential emergency. °· You have cramping pain felt low in the stomach (stronger than menstrual cramps). °· You notice vaginal discharge (watery, mucus, or bloody). °· You have a low, dull backache. °· There are regular contractions or uterine tightening. °SEEK IMMEDIATE MEDICAL CARE IF: °You have vaginal bleeding and have placenta previa.  °  °This information is not intended to replace advice given to you by your health care provider. Make sure you discuss any questions you have with your health care provider. °  °Document Released: 10/28/2010 Document Revised: 09/25/2011 Document Reviewed: 01/04/2015 °Elsevier Interactive Patient Education ©2016 Elsevier Inc. ° ° °

## 2015-12-17 NOTE — MAU Provider Note (Signed)
History     CSN: [id number]  Arrival date and time: 12/16/15 2225   None      Chief Complaint  Patient presents with  . Vaginal Bleeding   HPI Barbara Ortiz is a 25 y.o. [id number] at 254w4d who presents for vaginal bleeding. In March pt had IUGS ~ 5 weeks by ultrasound. Patient states she thought she had a miscarriage 7 weeks ago d/t large amount of vaginal bleeding and clots; did not receive medical treatment at that time. Reports continued vaginal bleeding nearly everyday since then. Has had intercourse since supposed miscarriage. Denies fever/chills. Reports some abdominal cramping.   OB History    Gravida Para Term Preterm AB TAB SAB Ectopic Multiple Living   6 4 4  0 1 0 1 0 0 4      History reviewed. No pertinent past medical history.  Past Surgical History  Procedure Laterality Date  . Cesarean section      Family History  Problem Relation Age of Onset  . Hypertension Mother     Social History  Substance Use Topics  . Smoking status: Never Smoker   . Smokeless tobacco: Never Used  . Alcohol Use: No    Allergies: No Known Allergies  Prescriptions prior to admission  Medication Sig Dispense Refill Last Dose  . promethazine (PHENERGAN) 25 MG tablet Take 0.5-1 tablets (12.5-25 mg total) by mouth every 6 (six) hours as needed. 30 tablet 0     Review of Systems  Constitutional: Negative for fever and chills.  Gastrointestinal: Positive for nausea and abdominal pain. Negative for vomiting, diarrhea and constipation.  Genitourinary: Negative for dysuria.       + vaginal bleeding   Physical Exam   Blood pressure 117/68, pulse 71, temperature 98.2 F (36.8 C), temperature source Oral, resp. rate 16, height 5\' 4"  (1.626 m), weight 170 lb (77.111 kg), last menstrual period 09/06/2015, SpO2 100 %.  Physical Exam  Nursing note and vitals reviewed. Constitutional: She is oriented to person, place, and time. She appears well-developed and well-nourished. No  distress.  HENT:  Head: Normocephalic and atraumatic.  Eyes: Conjunctivae are normal. Right eye exhibits no discharge. Left eye exhibits no discharge. No scleral icterus.  Neck: Normal range of motion.  Cardiovascular: Normal rate, regular rhythm and normal heart sounds.   No murmur heard. Respiratory: Effort normal and breath sounds normal. No respiratory distress. She has no wheezes.  GI: Soft. Bowel sounds are normal. She exhibits distension. There is no tenderness. There is no rebound and no guarding.  Genitourinary: Uterus is enlarged. Uterus is not tender. Cervix exhibits no motion tenderness. There is bleeding (small amount of tan discharge -- no blood on exam) in the vagina.  Neurological: She is alert and oriented to person, place, and time.  Skin: Skin is warm and dry. She is not diaphoretic.  Psychiatric: She has a normal mood and affect. Her behavior is normal. Judgment and thought content normal.    MAU Course  Procedures Results for orders placed or performed during the hospital encounter of 12/16/15 (from the past 24 hour(s))  Urinalysis, Routine w reflex microscopic ([facility])     Status: Abnormal   Collection Time: 12/16/15 10:55 PM  Result Value Ref Range   Color, Urine YELLOW YELLOW   APPearance CLEAR CLEAR   Specific Gravity, Urine >1.030 (H) 1.005 - 1.030   pH 6.0 5.0 - 8.0   Glucose, UA NEGATIVE NEGATIVE mg/dL   Hgb urine dipstick TRACE (A)  NEGATIVE   Bilirubin Urine NEGATIVE NEGATIVE   Ketones, ur NEGATIVE NEGATIVE mg/dL   Protein, ur NEGATIVE NEGATIVE mg/dL   Nitrite NEGATIVE NEGATIVE   Leukocytes, UA NEGATIVE NEGATIVE  Urine microscopic-add on     Status: Abnormal   Collection Time: 12/16/15 10:55 PM  Result Value Ref Range   Squamous Epithelial / LPF 0-5 (A) NONE SEEN   WBC, UA NONE SEEN 0 - 5 WBC/hpf   RBC / HPF 0-5 0 - 5 RBC/hpf   Bacteria, UA FEW (A) NONE SEEN   Urine-Other MUCOUS PRESENT   Pregnancy, urine POC     Status: Abnormal   Collection  Time: 12/16/15 11:21 PM  Result Value Ref Range   Preg Test, Ur POSITIVE (A) NEGATIVE  CBC     Status: Abnormal   Collection Time: 12/17/15 12:13 AM  Result Value Ref Range   WBC 5.5 4.0 - 10.5 K/uL   RBC 3.98 3.87 - 5.11 MIL/uL   Hemoglobin 11.0 (L) 12.0 - 15.0 g/dL   HCT 78.2 (L) 95.6 - 21.3 %   MCV 82.4 78.0 - 100.0 fL   MCH 27.6 26.0 - 34.0 pg   MCHC 33.5 30.0 - 36.0 g/dL   RDW 08.6 57.8 - 46.9 %   Platelets 305 150 - 400 K/uL  hCG, quantitative, pregnancy     Status: Abnormal   Collection Time: 12/17/15 12:13 AM  Result Value Ref Range   hCG, Beta Chain, Quant, S 31 (H) <5 mIU/mL   US Ob Comp Less 14 Wks  12/17/2015  CLINICAL DATA:  Chronic vaginal bleeding, after prior spontaneous abortion. Initial encounter. EXAM: OBSTETRIC <14 WK [place] AND TRANSVAGINAL OB US TECHNIQUE: Both transabdominal and transvaginal ultrasound examinations were performed for complete evaluation of the gestation as well as the maternal uterus, adnexal regions, and pelvic cul-de-sac. Transvaginal technique was performed to assess early pregnancy. COMPARISON:  None. FINDINGS: Intrauterine gestational sac: None seen. Yolk sac:  N/A Embryo:  N/A Subchorionic hemorrhage:  None visualized. Maternal uterus/adnexae: There is thickening of the endometrial echo complex, with a large focus of associated blood flow apparently extending minimally into the myometrium. This is suspicious for retained products of conception, possibly infiltrating into the myometrium. The right ovary measures 2.5 x 1.9 x 1.7 cm, while the left ovary measures 4.1 x 3.9 x 3.3 cm. There appears to be a 2.8 cm partially organized hemorrhagic cyst at the left ovary. No free fluid is seen within the pelvic cul-de-sac. IMPRESSION: 1. Suspect retained products of conception, possibly infiltrating minimally into the myometrium. Large focus of Doppler blood flow noted at the endometrial echo complex, with associated thickening of the endometrial echo complex. 2.  2.8 cm partially organized hemorrhagic cyst at the left ovary. These results were called by telephone at the time of interpretation on 12/17/2015 at 12:58 am to Judeth Horn NP, who verbally acknowledged these results. Electronically Signed   By: Roanna Raider M.D.   On: 12/17/2015 01:00   US Ob Transvaginal  12/17/2015  CLINICAL DATA:  Chronic vaginal bleeding, after prior spontaneous abortion. Initial encounter. EXAM: OBSTETRIC <14 WK [place] AND TRANSVAGINAL OB US TECHNIQUE: Both transabdominal and transvaginal ultrasound examinations were performed for complete evaluation of the gestation as well as the maternal uterus, adnexal regions, and pelvic cul-de-sac. Transvaginal technique was performed to assess early pregnancy. COMPARISON:  None. FINDINGS: Intrauterine gestational sac: None seen. Yolk sac:  N/A Embryo:  N/A Subchorionic hemorrhage:  None visualized. Maternal uterus/adnexae: There is thickening of the  endometrial echo complex, with a large focus of associated blood flow apparently extending minimally into the myometrium. This is suspicious for retained products of conception, possibly infiltrating into the myometrium. The right ovary measures 2.5 x 1.9 x 1.7 cm, while the left ovary measures 4.1 x 3.9 x 3.3 cm. There appears to be a 2.8 cm partially organized hemorrhagic cyst at the left ovary. No free fluid is seen within the pelvic cul-de-sac. IMPRESSION: 1. Suspect retained products of conception, possibly infiltrating minimally into the myometrium. Large focus of Doppler blood flow noted at the endometrial echo complex, with associated thickening of the endometrial echo complex. 2. 2.8 cm partially organized hemorrhagic cyst at the left ovary. These results were called by telephone at the time of interpretation on 12/17/2015 at 12:58 am to Judeth Horn NP, who verbally acknowledged these results. Electronically Signed   By: Roanna Raider M.D.   On: 12/17/2015 01:00    MDM O positive + UPT -- BHCG  31 Ultrasound shows retained POC S/w Dr. Glen Kesinger Fulling regarding patient & ultrasound report -- Cytotec 800 mcg vaginally & f/u in clinic.  Discussed results & tx plan with patient; pt agreeable, all questions answered Cytotec placed by RN in MAU  Assessment and Plan  A: 1. Retained products of conception, early pregnancy   2. Vaginal bleeding in pregnancy, first trimester     P: Discharge home Rx ibuprofen & phenergan Pt states she has hydrocodone at home from dental procedure last week Discussed reasons to return to MAU Pelvic rest Msg sent to clinic for f/u next week  Judeth Horn 12/17/2015, 1:23 AM           Early Intrauterine Pregnancy Failure  __X_  Documented intrauterine pregnancy failure less than or equal to [redacted] weeks gestation --- retained products  _X_  No serious current illness  _X_  Baseline Hgb greater than or equal to 10g/dl  _X__  Patient has easily accessible transportation to the hospital  _X_  Clear preference  _X__  Practitioner/physician deems patient reliable  _X__  Counseling by practitioner or physician  _X__  Patient education by RN  _X_  Consent form signed  _n/a__  Rho-Gam given by RN if indicated  _X__ Medication dispensed   _X__   Cytotec 800 mcg  __   Intravaginally by patient at home         _X_   Intravaginally by RN in MAU        __   Rectally by patient at home        __   Rectally by RN in MAU  ___  Ibuprofen 600 mg 1 tablet by mouth every 6 hours as needed #30  ___  Hydrocodone/acetaminophen 5/325 mg by mouth every 4 to 6 hours as needed -- pt has some at home from oral procedure last week  _X__  Phenergan 12.5 mg by mouth every 4 hours as needed for nausea

## 2015-12-20 ENCOUNTER — Inpatient Hospital Stay (HOSPITAL_COMMUNITY)
Admission: AD | Admit: 2015-12-20 | Discharge: 2015-12-20 | Disposition: A | Discharge status: Home / Self Care | Payer: Medicaid Other | Source: Ambulatory Visit | Attending: Obstetrics & Gynecology | Admitting: Obstetrics & Gynecology

## 2015-12-20 DIAGNOSIS — O039 Complete or unspecified spontaneous abortion without complication: Secondary | ICD-10-CM | POA: Diagnosis not present

## 2015-12-20 DIAGNOSIS — O034 Incomplete spontaneous abortion without complication: Secondary | ICD-10-CM | POA: Insufficient documentation

## 2015-12-20 DIAGNOSIS — Z5189 Encounter for other specified aftercare: Secondary | ICD-10-CM

## 2015-12-20 NOTE — MAU Note (Signed)
Doing ok, has occ pain. Bleeding has not changed since took medication (early Fri a.m.); almost no bleeding today.

## 2015-12-20 NOTE — Discharge Instructions (Signed)
Miscarriage  A miscarriage is the sudden loss of an unborn baby (fetus) before the 20th week of pregnancy. Most miscarriages happen in the first 3 months of pregnancy. Sometimes, it happens before a woman even knows she is pregnant. A miscarriage is also called a "spontaneous miscarriage" or "early pregnancy loss." Having a miscarriage can be an emotional experience. Talk with your caregiver about any questions you may have about miscarrying, the grieving process, and your future pregnancy plans.  CAUSES    Problems with the fetal chromosomes that make it impossible for the baby to develop normally. Problems with the baby's genes or chromosomes are most often the result of errors that occur, by chance, as the embryo divides and grows. The problems are not inherited from the parents.   Infection of the cervix or uterus.    Hormone problems.    Problems with the cervix, such as having an incompetent cervix. This is when the tissue in the cervix is not strong enough to hold the pregnancy.    Problems with the uterus, such as an abnormally shaped uterus, uterine fibroids, or congenital abnormalities.    Certain medical conditions.    Smoking, drinking alcohol, or taking illegal drugs.    Trauma.   Often, the cause of a miscarriage is unknown.   SYMPTOMS    Vaginal bleeding or spotting, with or without cramps or pain.   Pain or cramping in the abdomen or lower back.   Passing fluid, tissue, or blood clots from the vagina.  DIAGNOSIS   Your caregiver will perform a physical exam. You may also have an ultrasound to confirm the miscarriage. Blood or urine tests may also be ordered.  TREATMENT    Sometimes, treatment is not necessary if you naturally pass all the fetal tissue that was in the uterus. If some of the fetus or placenta remains in the body (incomplete miscarriage), tissue left behind may become infected and must be removed. Usually, a dilation and curettage (D and C) procedure is performed.  During a D and C procedure, the cervix is widened (dilated) and any remaining fetal or placental tissue is gently removed from the uterus.   Antibiotic medicines are prescribed if there is an infection. Other medicines may be given to reduce the size of the uterus (contract) if there is a lot of bleeding.   If you have Rh negative blood and your baby was Rh positive, you will need a Rh immunoglobulin shot. This shot will protect any future baby from having Rh blood problems in future pregnancies.  HOME CARE INSTRUCTIONS    Your caregiver may order bed rest or may allow you to continue light activity. Resume activity as directed by your caregiver.   Have someone help with home and family responsibilities during this time.    Keep track of the number of sanitary pads you use each day and how soaked (saturated) they are. Write down this information.    Do not use tampons. Do not douche or have sexual intercourse until approved by your caregiver.    Only take over-the-counter or prescription medicines for pain or discomfort as directed by your caregiver.    Do not take aspirin. Aspirin can cause bleeding.    Keep all follow-up appointments with your caregiver.    If you or your partner have problems with grieving, talk to your caregiver or seek counseling to help cope with the pregnancy loss. Allow enough time to grieve before trying to get pregnant again.     SEEK IMMEDIATE MEDICAL CARE IF:    You have severe cramps or pain in your back or abdomen.   You have a fever.   You pass large blood clots (walnut-sized or larger) ortissue from your vagina. Save any tissue for your caregiver to inspect.    Your bleeding increases.    You have a thick, bad-smelling vaginal discharge.   You become lightheaded, weak, or you faint.    You have chills.   MAKE SURE YOU:   Understand these instructions.   Will watch your condition.   Will get help right away if you are not doing well or get worse.     This  information is not intended to replace advice given to you by your health care provider. Make sure you discuss any questions you have with your health care provider.     Document Released: 12/27/2000 Document Revised: 10/28/2012 Document Reviewed: 08/22/2011  Elsevier Interactive Patient Education 2016 [employer]

## 2015-12-20 NOTE — MAU Provider Note (Signed)
S:  [name] is a 25 y.o. female [phone] diagnosed recently with incomplete SAB. She was seen here in the MAU on Friday. She had a quant of 30 with questionable remained POC. She was given cytotec and told to follow up in the [facility]. She came here with concerns of not having a "heavy Bleed".  She has continued to have bleeding, she bled over the weekend and continues have some light bleeding. She called the clinic looking for advise and they recommended she be seen here in the MAU.   She denies pain   O:   GENERAL: Well-developed, well-nourished female in no acute distress.  LUNGS: Effort normal SKIN: Warm, dry and without erythema PSYCH: Normal mood and affect  Filed Vitals:   12/20/15 0939  BP: 123/74  Pulse: 82  Temp: 98.7 F (37.1 C)  Resp: 16    MDM:  Patient stable, continues to have light bleeding. Quant 30; no intervention needed today in MAU. Patient instructed to follow up with the [facility]  In 1-2 weeks.    A:  1. Follow-up visit after miscarriage     P:  Discharge home in stable condition  Follow up with the [facility] as needed in 1-2 weeks. Bleeding precautions Return to MAU if symptoms worsen   Duane LopeJennifer I Rasch, NP 12/20/2015 9:44 AM

## 2015-12-23 ENCOUNTER — Telehealth: Payer: Self-pay | Admitting: *Deleted

## 2015-12-23 NOTE — Telephone Encounter (Signed)
Patient was had scheduled her NOB appointment but she had SAB. She was seen at MAU for treatment- and told to follow up at her provider. She is trying to make sure the medication she was given at MAU for retained products worked because she never had the heavy clotting they told her to expect. She has had bleeding- but not to that extent. Her last quant was 31 - 6 days ago. Appointment was given because patient was told at the GYN clinic- she could not schedule until July.

## 2015-12-28 ENCOUNTER — Ambulatory Visit (INDEPENDENT_AMBULATORY_CARE_PROVIDER_SITE_OTHER): Payer: Medicaid Other | Admitting: Certified Nurse Midwife

## 2015-12-28 ENCOUNTER — Encounter: Payer: Self-pay | Admitting: Certified Nurse Midwife

## 2015-12-28 VITALS — BP 121/81 | HR 70 | Wt 172.0 lb

## 2015-12-28 DIAGNOSIS — O039 Complete or unspecified spontaneous abortion without complication: Secondary | ICD-10-CM

## 2015-12-28 HISTORY — DX: Complete or unspecified spontaneous abortion without complication: O03.9

## 2015-12-28 LAB — POCT URINALYSIS DIPSTICK
BILIRUBIN UA: NEGATIVE
GLUCOSE UA: NEGATIVE
Ketones, UA: NEGATIVE
LEUKOCYTES UA: NEGATIVE
NITRITE UA: NEGATIVE
Protein, UA: NEGATIVE
RBC UA: 250
Spec Grav, UA: >=1.03
UROBILINOGEN UA: NEGATIVE
pH, UA: 5

## 2015-12-28 NOTE — Progress Notes (Signed)
Patient ID: Barbara Ortiz, female   DOB: September 18, 1990, 25 y.o.   MRN: [id number]   Chief Complaint  Patient presents with  . Routine Prenatal Visit    HPI Barbara Ortiz is a 25 y.o. female.  Recent SAB, started bleeding April 22nd, and f/u in late May with MAU.  Had retained POC June 2nd.  Was given cytotec 800 mcg vaginally for retained POC.  Has been having irregular bleeding since.  With times of "gushing" blood.  Not still taking her PNV.  Has not taken any pain medications.  Had recent dental work.  She gets cramping and twinge feeling in her vaginal walls and pain on her right side.  Desires to start on Nuva Ring.  Denies any problems going to the bathroom.      HPI  No past medical history on file.  Past Surgical History  Procedure Laterality Date  . Cesarean section      Family History  Problem Relation Age of Onset  . Hypertension Mother     Social History Social History  Substance Use Topics  . Smoking status: Never Smoker   . Smokeless tobacco: Never Used  . Alcohol Use: No    No Known Allergies  Current Outpatient Prescriptions  Medication Sig Dispense Refill  . ibuprofen (ADVIL,MOTRIN) 600 MG tablet Take 1 tablet (600 mg total) by mouth every 6 (six) hours as needed. 30 tablet 0   No current facility-administered medications for this visit.    Review of Systems Review of Systems Constitutional: negative for fatigue and weight loss Respiratory: negative for cough and wheezing Cardiovascular: negative for chest pain, fatigue and palpitations Gastrointestinal: negative for abdominal pain and change in bowel habits Genitourinary:negative Integument/breast: negative for nipple discharge Musculoskeletal:negative for myalgias Neurological: negative for gait problems and tremors Behavioral/Psych: negative for abusive relationship, depression Endocrine: negative for temperature intolerance     Blood pressure 121/81, pulse 70, weight 172 lb (78.019 kg),  last menstrual period 09/06/2015.  Physical Exam Physical Exam General:   alert  Skin:   no rash or abnormalities  Lungs:   clear to auscultation bilaterally  Heart:   regular rate and rhythm, S1, S2 normal, no murmur, click, rub or gallop  Breasts:   deferred  Abdomen:  normal findings: no organomegaly, soft, non-tender and no hernia  Pelvis:  External genitalia: normal general appearance Urinary system: urethral meatus normal and bladder without fullness, nontender Vaginal: deferred Cervix: deferred Adnexa: deferred Uterus: deferred    50% of 15 min visit spent on counseling and coordination of care.   Data Reviewed Previous medical hx, meds, labs, US  Assessment     Recent SAB with retained POC, f/u today     Plan   Depending on HCG levels, start on Nuva Ring   If elevated HCG levels f/u US and discuss results for D&C with Dr. Clearance CootsHarper  Orders Placed This Encounter  Procedures  . Beta HCG, Quant  . CBC with Differential/Platelet  . POCT urinalysis dipstick   No orders of the defined types were placed in this encounter.     Possible management options include:Nuva Ring, D&C, Ultrasound Follow up in 4 weeks for annual exam.

## 2015-12-29 LAB — CBC WITH DIFFERENTIAL/PLATELET
BASOS ABS: 0 10*3/uL (ref 0.0–0.2)
BASOS: 1 %
EOS (ABSOLUTE): 0 10*3/uL (ref 0.0–0.4)
EOS: 1 %
HEMATOCRIT: 34.1 % (ref 34.0–46.6)
HEMOGLOBIN: 11 g/dL — AB (ref 11.1–15.9)
IMMATURE GRANS (ABS): 0 10*3/uL (ref 0.0–0.1)
Immature Granulocytes: 0 %
LYMPHS: 57 %
Lymphocytes Absolute: 2.9 10*3/uL (ref 0.7–3.1)
MCH: 27 pg (ref 26.6–33.0)
MCHC: 32.3 g/dL (ref 31.5–35.7)
MCV: 84 fL (ref 79–97)
MONOCYTES: 8 %
Monocytes Absolute: 0.4 10*3/uL (ref 0.1–0.9)
NEUTROS ABS: 1.7 10*3/uL (ref 1.4–7.0)
Neutrophils: 33 %
Platelets: 295 10*3/uL (ref 150–379)
RBC: 4.07 x10E6/uL (ref 3.77–5.28)
RDW: 14.1 % (ref 12.3–15.4)
WBC: 5 10*3/uL (ref 3.4–10.8)

## 2015-12-29 LAB — BETA HCG QUANT (REF LAB): hCG Quant: 7 m[IU]/mL

## 2016-01-05 ENCOUNTER — Other Ambulatory Visit: Payer: Self-pay | Admitting: *Deleted

## 2016-01-05 DIAGNOSIS — O039 Complete or unspecified spontaneous abortion without complication: Secondary | ICD-10-CM

## 2016-01-05 NOTE — Progress Notes (Signed)
Pt has had recent SAB. Per R Denney CNM, pt to have repeat quant and follow up u/s.  Ultrasound has been ordered. Pt has been scheduled for quant on 01-06-16. Pt advised to see Barb at check out to inquire about u/s appt. Pt is also to be scheduled f/u appt with Dr Clearance CootsHarper after u/s.

## 2016-01-06 ENCOUNTER — Other Ambulatory Visit: Payer: Medicaid Other

## 2016-01-07 ENCOUNTER — Other Ambulatory Visit: Payer: Medicaid Other

## 2016-01-10 ENCOUNTER — Ambulatory Visit (HOSPITAL_COMMUNITY): Payer: Medicaid Other

## 2016-01-11 ENCOUNTER — Ambulatory Visit: Payer: Medicaid Other | Admitting: Obstetrics

## 2016-01-19 ENCOUNTER — Other Ambulatory Visit: Payer: Medicaid Other

## 2016-01-19 DIAGNOSIS — O039 Complete or unspecified spontaneous abortion without complication: Secondary | ICD-10-CM

## 2016-01-20 ENCOUNTER — Ambulatory Visit (HOSPITAL_COMMUNITY)
Admission: RE | Admit: 2016-01-20 | Discharge: 2016-01-20 | Disposition: A | Discharge status: Home / Self Care | Payer: Medicaid Other | Source: Ambulatory Visit | Attending: Certified Nurse Midwife | Admitting: Certified Nurse Midwife

## 2016-01-20 ENCOUNTER — Encounter (HOSPITAL_COMMUNITY): Payer: Self-pay

## 2016-01-20 DIAGNOSIS — O039 Complete or unspecified spontaneous abortion without complication: Secondary | ICD-10-CM | POA: Diagnosis not present

## 2016-01-20 LAB — BETA HCG QUANT (REF LAB): hCG Quant: 1 m[IU]/mL

## 2016-01-25 ENCOUNTER — Encounter: Payer: Self-pay | Admitting: Certified Nurse Midwife

## 2016-01-25 ENCOUNTER — Ambulatory Visit (INDEPENDENT_AMBULATORY_CARE_PROVIDER_SITE_OTHER): Payer: Medicaid Other | Admitting: Certified Nurse Midwife

## 2016-01-25 VITALS — BP 118/80 | HR 68 | Temp 97.6°F | Wt 173.0 lb

## 2016-01-25 DIAGNOSIS — Z8742 Personal history of other diseases of the female genital tract: Secondary | ICD-10-CM | POA: Insufficient documentation

## 2016-01-25 DIAGNOSIS — Z8759 Personal history of other complications of pregnancy, childbirth and the puerperium: Secondary | ICD-10-CM

## 2016-01-25 DIAGNOSIS — R319 Hematuria, unspecified: Secondary | ICD-10-CM

## 2016-01-25 DIAGNOSIS — Z30018 Encounter for initial prescription of other contraceptives: Secondary | ICD-10-CM

## 2016-01-25 DIAGNOSIS — Z01419 Encounter for gynecological examination (general) (routine) without abnormal findings: Secondary | ICD-10-CM | POA: Insufficient documentation

## 2016-01-25 DIAGNOSIS — Z0000 Encounter for general adult medical examination without abnormal findings: Secondary | ICD-10-CM | POA: Diagnosis not present

## 2016-01-25 MED ORDER — PRENATE PIXIE 10-0.6-0.4-200 MG PO CAPS: 1.0000 | ORAL_CAPSULE | Freq: Every day | ORAL | Status: DC

## 2016-01-25 MED ORDER — ETONOGESTREL-ETHINYL ESTRADIOL 0.12-0.015 MG/24HR VA RING: VAGINAL_RING | VAGINAL | Status: DC

## 2016-01-25 NOTE — Progress Notes (Signed)
Patient ID: Barbara Ortiz, female   DOB: 1991-04-13, 25 y.o.   MRN: [id number]   Subjective:        Barbara Ortiz is a 25 y.o. female here for a routine exam.  Current complaints:  Patient desire NuvaRing for birth control.  Reporting sharp shooting pain originating at hips towards vagina (since March 2017).  Denies any further vaginal or breast issues.  Had colposcopy 09-22-2015.  Desires STD screening.  PGF: breast CA.  Denies any further CA history.   Personal health questionnaire:  Is patient Ashkenazi Jewish, have a family history of breast and/or ovarian cancer: no Is there a family history of uterine cancer diagnosed at age < 74, gastrointestinal cancer, urinary tract cancer, family member who is a Personnel officer syndrome-associated carrier: no Is the patient overweight and hypertensive, family history of diabetes, personal history of gestational diabetes, preeclampsia or PCOS: no Is patient over 31, have PCOS,  family history of premature CHD under age 26, diabetes, smoke, have hypertension or peripheral artery disease:  no At any time, has a partner hit, kicked or otherwise hurt or frightened you?: no Over the past 2 weeks, have you felt down, depressed or hopeless?: no Over the past 2 weeks, have you felt little interest or pleasure in doing things?:no   Gynecologic History Patient's last menstrual period was 09/06/2015. Contraception: none Last Pap: 08-06-2015. Results were: ATYPICAL SQUAMOUS CELLS CANNOT EXCLUDE A HIGH GRADE SQUAMOUS INTRAEPITHELIAL LESION (ASC-H). Last mammogram: N/A. Results were: N/A  Obstetric History OB History  Gravida Para Term Preterm AB SAB TAB Ectopic Multiple Living  0 2 2 0 0 0 4    # Outcome Date GA Lbr Len/2nd Weight Sex Delivery Anes PTL Lv  6 Term 08/08/11    F Vag-Spont EPI  Y  5 Term 08/03/10    F Vag-Spont EPI  Y  4 Term 03/12/08    F Vag-Spont EPI  Y  3 SAB           2 Term           1 SAB               Past Medical  History  Diagnosis Date  . Bronchitis     Past Surgical History  Procedure Laterality Date  . Cesarean section       Current outpatient prescriptions:  .  ibuprofen (ADVIL,MOTRIN) 600 MG tablet, Take 1 tablet (600 mg total) by mouth every 6 (six) hours as needed., Disp: 30 tablet, Rfl: 0 .  etonogestrel-ethinyl estradiol (NUVARING) 0.12-0.015 MG/24HR vaginal ring, Insert vaginally and leave in place for 4 consecutive weeks, then remove and replace., Disp: 3 each, Rfl: 4 .  Prenat-FeAsp-Meth-FA-DHA w/o A (PRENATE PIXIE) 10-0.6-0.4-200 MG CAPS, Take 1 tablet by mouth daily., Disp: 30 capsule, Rfl: 12 No Known Allergies  Social History  Substance Use Topics  . Smoking status: Never Smoker   . Smokeless tobacco: Never Used  . Alcohol Use: No    Family History  Problem Relation Age of Onset  . Hypertension Mother   . Diabetes Maternal Grandmother   . Diabetes Maternal Grandfather   . Hypertension Maternal Grandmother   . Hypertension Maternal Grandfather   . Cancer Paternal Grandmother       Review of Systems  Constitutional: negative for fatigue and weight loss Respiratory: negative for cough and wheezing Cardiovascular: negative for chest pain, fatigue and palpitations GastroVeatrice Bourbongative for abdominal pain and change in bowel habits  Musculoskeletal:shooting pain from hip to vagina Neurological: negative for gait problems and tremors Behavioral/Psych: negative for abusive relationship, depression Endocrine: negative for temperature intolerance   Genitourinary:negative for abnormal menstrual periods, genital lesions, hot flashes, sexual problems and vaginal discharge Integument/breast: negative for breast lump, breast tenderness, nipple discharge and skin lesion(s)    Objective:       BP 118/80 mmHg  Pulse 68  Temp(Src) 97.6 F (36.4 C)  Wt 173 lb (78.472 kg)  LMP 09/06/2015 General:   alert  Skin:   no rash or abnormalities  Lungs:   clear to auscultation  bilaterally  Heart:   regular rate and rhythm, S1, S2 normal, no murmur, click, rub or gallop  Breasts:   normal without suspicious masses, skin or nipple changes or axillary nodes  Abdomen:  normal findings: no organomegaly, soft, non-tender and no hernia  Pelvis:  External genitalia: normal general appearance Urinary system: urethral meatus normal and bladder without fullness, nontender Vaginal: normal without tenderness, induration or masses Cervix: friable Adnexa: normal bimanual exam Uterus: anteverted and non-tender, normal size   Lab Review Urine pregnancy test Labs reviewed yes Radiologic studies reviewed yes  50% of 30 min visit spent on counseling and coordination of care.   Assessment:    Healthy female exam.   Contraception counseling. STD screening. Hematuria. Hx of abnormal pap. SAB within last 12 months. Friable cervix.   Plan:    Education reviewed: depression evaluation, low fat, low cholesterol diet, safe sex/STD prevention, self breast exams and weight bearing exercise. Contraception: NuvaRing vaginal inserts. Follow up in: 1 year.   Meds ordered this encounter  Medications  . Prenat-FeAsp-Meth-FA-DHA w/o A (PRENATE PIXIE) 10-0.6-0.4-200 MG CAPS    Sig: Take 1 tablet by mouth daily.    Dispense:  30 capsule    Refill:  12    Please process coupon: Rx BIN: [id number], RxPCN: [id number], RxGRP: [id number]: [id number], Rx: [id number]: [id number]  SUF: 01  . etonogestrel-ethinyl estradiol (NUVARING) 0.12-0.015 MG/24HR vaginal ring    Sig: Insert vaginally and leave in place for 4 consecutive weeks, then remove and replace.    Dispense:  3 each    Refill:  4   Orders Placed This Encounter  Procedures  . Urine culture  . Hepatitis B surface antigen  . RPR  . Hepatitis C antibody  . HIV antibody  . CBC with Differential/Platelet  . Comprehensive metabolic panel  . TSH   Need to obtain previous records Possible management options include: possible repeat colposcopy (if today's  PAP is abnormal), new birth control method. Follow up as needed, after pap

## 2016-01-26 ENCOUNTER — Telehealth: Payer: Self-pay | Admitting: *Deleted

## 2016-01-26 LAB — CBC WITH DIFFERENTIAL/PLATELET
Basophils Absolute: 0 10*3/uL (ref 0.0–0.2)
Basos: 1 %
EOS (ABSOLUTE): 0 10*3/uL (ref 0.0–0.4)
Eos: 0 %
HEMATOCRIT: 35.2 % (ref 34.0–46.6)
HEMOGLOBIN: 11.2 g/dL (ref 11.1–15.9)
Immature Grans (Abs): 0 10*3/uL (ref 0.0–0.1)
Immature Granulocytes: 0 %
LYMPHS ABS: 2.4 10*3/uL (ref 0.7–3.1)
Lymphs: 50 %
MCH: 26.4 pg — AB (ref 26.6–33.0)
MCHC: 31.8 g/dL (ref 31.5–35.7)
MCV: 83 fL (ref 79–97)
MONOS ABS: 0.5 10*3/uL (ref 0.1–0.9)
Monocytes: 11 %
NEUTROS ABS: 1.8 10*3/uL (ref 1.4–7.0)
NEUTROS PCT: 38 %
Platelets: 306 10*3/uL (ref 150–379)
RBC: 4.24 x10E6/uL (ref 3.77–5.28)
RDW: 13.9 % (ref 12.3–15.4)
WBC: 4.7 10*3/uL (ref 3.4–10.8)

## 2016-01-26 LAB — HEPATITIS B SURFACE ANTIGEN: Hepatitis B Surface Ag: NEGATIVE

## 2016-01-26 LAB — COMPREHENSIVE METABOLIC PANEL
A/G RATIO: 1.2 (ref 1.2–2.2)
ALBUMIN: 3.9 g/dL (ref 3.5–5.5)
ALK PHOS: 56 IU/L (ref 39–117)
ALT: 11 IU/L (ref 0–32)
AST: 18 IU/L (ref 0–40)
BILIRUBIN TOTAL: 0.4 mg/dL (ref 0.0–1.2)
BUN / CREAT RATIO: 12 (ref 9–23)
BUN: 8 mg/dL (ref 6–20)
CO2: 22 mmol/L (ref 18–29)
CREATININE: 0.67 mg/dL (ref 0.57–1.00)
Calcium: 8.8 mg/dL (ref 8.7–10.2)
Chloride: 100 mmol/L (ref 96–106)
GFR calc Af Amer: 140 mL/min/{1.73_m2} (ref >59)
GFR calc non Af Amer: 122 mL/min/{1.73_m2} (ref >59)
GLOBULIN, TOTAL: 3.3 g/dL (ref 1.5–4.5)
Glucose: 83 mg/dL (ref 65–99)
POTASSIUM: 4 mmol/L (ref 3.5–5.2)
SODIUM: 138 mmol/L (ref 134–144)
Total Protein: 7.2 g/dL (ref 6.0–8.5)

## 2016-01-26 LAB — PAP IG W/ RFLX HPV ASCU: PAP Smear Comment: 0

## 2016-01-26 LAB — HEPATITIS C ANTIBODY

## 2016-01-26 LAB — HIV ANTIBODY (ROUTINE TESTING W REFLEX): HIV SCREEN 4TH GENERATION: NONREACTIVE

## 2016-01-26 LAB — TSH: TSH: 0.834 u[IU]/mL (ref 0.450–4.500)

## 2016-01-26 LAB — RPR: RPR: NONREACTIVE

## 2016-01-27 LAB — URINE CULTURE

## 2016-01-27 NOTE — Telephone Encounter (Signed)
Patient was made aware that US result was normal call for any problems.

## 2016-01-28 ENCOUNTER — Other Ambulatory Visit: Payer: Self-pay | Admitting: Certified Nurse Midwife

## 2016-01-28 DIAGNOSIS — B9689 Other specified bacterial agents as the cause of diseases classified elsewhere: Secondary | ICD-10-CM

## 2016-01-28 DIAGNOSIS — N760 Acute vaginitis: Principal | ICD-10-CM

## 2016-01-28 MED ORDER — METRONIDAZOLE 500 MG PO TABS: 500.0000 mg | ORAL_TABLET | Freq: Two times a day (BID) | ORAL | Status: DC

## 2016-01-29 LAB — NUSWAB VG+, CANDIDA 6SP
Atopobium vaginae: HIGH Score — AB
BVAB 2: HIGH Score — AB
CANDIDA GLABRATA, NAA: NEGATIVE
CANDIDA KRUSEI, NAA: NEGATIVE
CANDIDA LUSITANIAE, NAA: NEGATIVE
Candida albicans, NAA: NEGATIVE
Candida parapsilosis, NAA: NEGATIVE
Candida tropicalis, NAA: NEGATIVE
Chlamydia trachomatis, NAA: NEGATIVE
Megasphaera 1: HIGH Score — AB
Neisseria gonorrhoeae, NAA: NEGATIVE
TRICH VAG BY NAA: NEGATIVE

## 2016-02-02 ENCOUNTER — Telehealth: Payer: Self-pay | Admitting: *Deleted

## 2016-02-02 NOTE — Telephone Encounter (Signed)
See note for phone encounter. 

## 2016-02-02 NOTE — Telephone Encounter (Signed)
Discussed lab result with patient and medication sent to pharmacy.Made aware not to drink alcohol with this medication.

## 2016-03-09 ENCOUNTER — Encounter (HOSPITAL_COMMUNITY): Payer: Self-pay | Admitting: Emergency Medicine

## 2016-03-09 ENCOUNTER — Emergency Department (HOSPITAL_COMMUNITY)
Admission: EM | Admit: 2016-03-09 | Discharge: 2016-03-09 | Disposition: A | Discharge status: Home / Self Care | Payer: Medicaid Other | Attending: Emergency Medicine | Admitting: Emergency Medicine

## 2016-03-09 DIAGNOSIS — R51 Headache: Secondary | ICD-10-CM | POA: Insufficient documentation

## 2016-03-09 DIAGNOSIS — R519 Headache, unspecified: Secondary | ICD-10-CM

## 2016-03-09 MED ORDER — ACETAMINOPHEN 500 MG PO TABS
1000.0000 mg | ORAL_TABLET | Freq: Once | ORAL | Status: AC
  Administered 2016-03-09: 1000 mg via ORAL
  Filled 2016-03-09: qty 2

## 2016-03-09 MED ORDER — DIPHENHYDRAMINE HCL 50 MG/ML IJ SOLN
12.5000 mg | Freq: Once | INTRAMUSCULAR | Status: AC
  Administered 2016-03-09: 12.5 mg via INTRAVENOUS
  Filled 2016-03-09: qty 1

## 2016-03-09 MED ORDER — SODIUM CHLORIDE 0.9 % IV BOLUS (SEPSIS)
1000.0000 mL | Freq: Once | INTRAVENOUS | Status: AC
  Administered 2016-03-09: 1000 mL via INTRAVENOUS

## 2016-03-09 MED ORDER — METOCLOPRAMIDE HCL 5 MG/ML IJ SOLN
10.0000 mg | Freq: Once | INTRAMUSCULAR | Status: AC
  Administered 2016-03-09: 10 mg via INTRAVENOUS
  Filled 2016-03-09: qty 2

## 2016-03-09 MED ORDER — KETOROLAC TROMETHAMINE 30 MG/ML IJ SOLN
30.0000 mg | Freq: Once | INTRAMUSCULAR | Status: AC
Start: 2016-03-09 — End: 2016-03-09
  Administered 2016-03-09: 30 mg via INTRAMUSCULAR
  Filled 2016-03-09: qty 1

## 2016-03-09 NOTE — ED Provider Notes (Signed)
[facility]-EMERGENCY DEPT Provider Note   CSN: [id number] Arrival date & time: 03/09/16  0347     History   Chief Complaint Chief Complaint  Patient presents with  . Migraine    HPI Barbara Ortiz is a 25 y.o. female.  HPI  25 y.o. female with a hx of migraines, presents to the Emergency Department today complaining of migraine headache with onset x 2 days. Gradual. Notes hx similar in the past. Rates pain 6/10 and throbbing on right side of head. No trauma to area. Attempted tylenol and ibuprofen with minimal relief. No fevers. No N/V. No CP/SOB/ABD pain. No numbness/tingling. No vision changes. No other symptoms noted.   Past Medical History:  Diagnosis Date  . Bronchitis     Patient Active Problem List   Diagnosis Date Noted  . Well woman exam with routine gynecological exam 01/25/2016  . History of abnormal cervical Pap smear 01/25/2016  . SAB (spontaneous abortion) 12/28/2015  . Pap smear of cervix with ASCUS, cannot exclude HGSIL 09/22/2015  . 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 CESAREAN SECTION      OB History    Gravida Para Term Preterm AB Living   6 4 4  0 2 4   SAB TAB Ectopic Multiple Live Births   2 0 0 0 3       Home Medications    Prior to Admission medications   Medication Sig Start Date End Date Taking? Authorizing Provider  etonogestrel-ethinyl estradiol (NUVARING) 0.12-0.015 MG/24HR vaginal ring Insert vaginally and leave in place for 4 consecutive weeks, then remove and replace. 01/25/16   Rachelle A Denney, CNM  ibuprofen (ADVIL,MOTRIN) 600 MG tablet Take 1 tablet (600 mg total) by mouth every 6 (six) hours as needed. 12/17/15   Judeth HornErin Lawrence, NP  metroNIDAZOLE (FLAGYL) 500 MG tablet Take 1 tablet (500 mg total) by mouth 2 (two) times daily. 01/28/16   Rachelle A Denney, CNM  Prenat-FeAsp-Meth-FA-DHA w/o A (PRENATE PIXIE) 10-0.6-0.4-200 MG CAPS Take 1 tablet by mouth daily. 01/25/16   Roe Coombsachelle A  Denney, CNM    Family History Family History  Problem Relation Age of Onset  . Hypertension Mother   . Diabetes Maternal Grandmother   . Hypertension Maternal Grandmother   . Diabetes Maternal Grandfather   . Hypertension Maternal Grandfather   . Cancer Paternal Grandmother     Social History Social History  Substance Use Topics  . Smoking status: Never Smoker  . Smokeless tobacco: Never Used  . Alcohol use No     Allergies   Review of patient's allergies indicates no known allergies.   Review of Systems Review of Systems ROS reviewed and all are negative for acute change except as noted in the HPI.  Physical Exam Updated Vital Signs BP 125/80 (BP Location: Right Arm)   Pulse 72   Temp 98 F (36.7 C) (Oral)   Resp 18   Ht 5\' 4"  (1.626 m)   Wt 73.9 kg   LMP 08/30/2015 (Exact Date)   SpO2 100%   BMI 27.98 kg/m   Physical Exam  Constitutional: She is oriented to person, place, and time. Vital signs are normal. She appears well-developed and well-nourished.  HENT:  Head: Normocephalic and atraumatic.  Right Ear: Hearing normal.  Left Ear: Hearing normal.  Eyes: Conjunctivae and EOM are normal. Pupils are equal, round, and reactive to light.  Neck: Normal range of motion. Neck supple.  Cardiovascular: Normal  rate, regular rhythm, normal heart sounds and intact distal pulses.   Pulmonary/Chest: Effort normal and breath sounds normal.  Neurological: She is alert and oriented to person, place, and time. She has normal strength. No cranial nerve deficit or sensory deficit.  Cranial Nerves:  II: Pupils equal, round, reactive to light III,IV, VI: ptosis not present, extra-ocular motions intact bilaterally  V,VII: smile symmetric, facial light touch sensation equal VIII: hearing grossly normal bilaterally  IX,X: midline uvula rise  XI: bilateral shoulder shrug equal and strong XII: midline tongue extension  Skin: Skin is warm and dry.  Psychiatric: She has a  normal mood and affect. Her speech is normal and behavior is normal. Thought content normal.   ED Treatments / Results  Labs (all labs ordered are listed, but only abnormal results are displayed) Labs Reviewed - No data to display  EKG  EKG Interpretation None      Radiology No results found.  Procedures Procedures (including critical care time)  Medications Ordered in ED Medications  ketorolac (TORADOL) 30 MG/ML injection 30 mg (not administered)  acetaminophen (TYLENOL) tablet 1,000 mg (not administered)     Initial Impression / Assessment and Plan / ED Course  I have reviewed the triage vital signs and the nursing notes.  Pertinent labs & imaging results that were available during my care of the patient were reviewed by me and considered in my medical decision making (see chart for details).  Clinical Course   Final Clinical Impressions(s) / ED Diagnoses  I have reviewed the relevant previous healthcare records. I obtained HPI from historian.  ED Course:  Assessment: Patient is a 25yF that presents with headache since yesterday. Hx same. Patient is without high-risk features of headache including: Sudden onset/thunderclap HA, No similar headache in past, Altered mental status, Accompanying seizure, Headache with exertion, Age > 50, History of immunocompromise, Neck or shoulder pain, Fever, Use of anticoagulation, Family history of spontaneous SAH, Concomitant drug use, Toxic exposure.  Patient has a normal complete neurological exam, normal vital signs, normal level of consciousness, no signs of meningismus, is well-appearing/non-toxic appearing, no signs of trauma. No papilledema, no pain over the temporal arteries. Imaging with CT/MRI not indicated given history and physical exam findings. No dangerous or life-threatening conditions suspected or identified by history, physical exam, and by work-up. No indications for hospitalization identified. Headache improved with  reglan and benadryl. At time of discharge, Patient is in no acute distress. Vital Signs are stable. Patient is able to ambulate. Patient able to tolerate PO.   Disposition/Plan:  DC Home Additional Verbal discharge instructions given and discussed with patient.  Pt Instructed to f/u with PCP in the next week for evaluation and treatment of symptoms. Return precautions given Pt acknowledges and agrees with plan  Supervising Physician Tomasita CrumbleAdeleke Oni, MD   Final diagnoses:  Nonintractable headache, unspecified chronicity pattern, unspecified headache type    New Prescriptions New Prescriptions   No medications on file     Audry Piliyler Arushi Partridge, PA-C 03/09/16 [id number]    Tomasita CrumbleAdeleke Oni, MD 03/09/16 [phone]

## 2016-03-09 NOTE — ED Triage Notes (Signed)
Patient with migraine since yesterday.  She states she has been having some nausea, no vomiting.  Patient does have some sensitivity to light and sound.

## 2016-03-09 NOTE — Discharge Instructions (Signed)
Please read and follow all provided instructions.  Your diagnoses today include:  1. Nonintractable headache, unspecified chronicity pattern, unspecified headache type    Tests performed today include: Vital signs. See below for your results today.   Medications:  In the Emergency Department you received: Reglan - antinausea/headache medication Benadryl - antihistamine to counteract potential side effects of reglan Toradol - NSAID medication similar to ibuprofen  Take any prescribed medications only as directed.  Additional information:  Follow any educational materials contained in this packet.  You are having a headache. No specific cause was found today for your headache. It may have been a migraine or other cause of headache. Stress, anxiety, fatigue, and depression are common triggers for headaches.   Your headache today does not appear to be life-threatening or require hospitalization, but often the exact cause of headaches is not determined in the emergency department. Therefore, follow-up with your doctor is very important to find out what may have caused your headache and whether or not you need any further diagnostic testing or treatment.   Sometimes headaches can appear benign (not harmful), but then more serious symptoms can develop which should prompt an immediate re-evaluation by your doctor or the emergency department.  BE VERY CAREFUL not to take multiple medicines containing Tylenol (also called acetaminophen). Doing so can lead to an overdose which can damage your liver and cause liver failure and possibly death.   Follow-up instructions: Please follow-up with your primary care provider in the next 3 days for further evaluation of your symptoms.   Return instructions:  Please return to the Emergency Department if you experience worsening symptoms. Return if the medications do not resolve your headache, if it recurs, or if you have multiple episodes of vomiting or  cannot keep down fluids. Return if you have a change from the usual headache. RETURN IMMEDIATELY IF you: Develop a sudden, severe headache Develop confusion or become poorly responsive or faint Develop a fever above 100.62F or problem breathing Have a change in speech, vision, swallowing, or understanding Develop new weakness, numbness, tingling, incoordination in your arms or legs Have a seizure Please return if you have any other emergent concerns.  Additional Information:  Your vital signs today were: BP 125/80 (BP Location: Right Arm)    Pulse 72    Temp 98 F (36.7 C) (Oral)    Resp 18    Ht 5\' 4"  (1.626 m)    Wt 73.9 kg    LMP 08/30/2015 (Exact Date)    SpO2 100%    BMI 27.98 kg/m  If your blood pressure (BP) was elevated above 135/85 this visit, please have this repeated by your doctor within one month. --------------

## 2016-06-27 ENCOUNTER — Encounter (HOSPITAL_COMMUNITY): Payer: Self-pay

## 2016-06-27 DIAGNOSIS — N644 Mastodynia: Secondary | ICD-10-CM | POA: Insufficient documentation

## 2016-06-27 DIAGNOSIS — N611 Abscess of the breast and nipple: Secondary | ICD-10-CM | POA: Diagnosis not present

## 2016-06-27 LAB — POC URINE PREG, ED: PREG TEST UR: NEGATIVE

## 2016-06-27 NOTE — ED Triage Notes (Signed)
Pt states that she woke up today and L breast was painful and swollen, pt states that this has happened once before about 3 years ago when pregnant, pt breast is significantly larger, denies drainage or fevers.

## 2016-06-28 ENCOUNTER — Encounter (HOSPITAL_COMMUNITY): Payer: Self-pay | Admitting: *Deleted

## 2016-06-28 ENCOUNTER — Emergency Department (HOSPITAL_COMMUNITY)
Admission: EM | Admit: 2016-06-28 | Discharge: 2016-06-28 | Disposition: A | Discharge status: Home / Self Care | Payer: Medicaid Other | Source: Home / Self Care | Attending: Emergency Medicine | Admitting: Emergency Medicine

## 2016-06-28 ENCOUNTER — Inpatient Hospital Stay (HOSPITAL_COMMUNITY)
Admission: AD | Admit: 2016-06-28 | Discharge: 2016-06-28 | Disposition: A | Discharge status: Home / Self Care | Payer: Medicaid Other | Source: Ambulatory Visit | Attending: Obstetrics & Gynecology | Admitting: Obstetrics & Gynecology

## 2016-06-28 DIAGNOSIS — N611 Abscess of the breast and nipple: Secondary | ICD-10-CM

## 2016-06-28 DIAGNOSIS — N644 Mastodynia: Secondary | ICD-10-CM | POA: Insufficient documentation

## 2016-06-28 HISTORY — DX: Congenital malformation of breast, unspecified: Q83.9

## 2016-06-28 LAB — URINALYSIS, ROUTINE W REFLEX MICROSCOPIC
BILIRUBIN URINE: NEGATIVE
GLUCOSE, UA: NEGATIVE mg/dL
Ketones, ur: NEGATIVE mg/dL
NITRITE: NEGATIVE
PH: 5 (ref 5.0–8.0)
Protein, ur: NEGATIVE mg/dL
SPECIFIC GRAVITY, URINE: 1.034 — AB (ref 1.005–1.030)

## 2016-06-28 LAB — POCT PREGNANCY, URINE: Preg Test, Ur: NEGATIVE

## 2016-06-28 MED ORDER — NAPROXEN 375 MG PO TABS: 375.0000 mg | ORAL_TABLET | Freq: Two times a day (BID) | ORAL | 0 refills | Status: DC

## 2016-06-28 MED ORDER — CEPHALEXIN 500 MG PO CAPS: 500.0000 mg | ORAL_CAPSULE | Freq: Four times a day (QID) | ORAL | 0 refills | Status: DC

## 2016-06-28 MED ORDER — SULFAMETHOXAZOLE-TRIMETHOPRIM 800-160 MG PO TABS: 1.0000 | ORAL_TABLET | Freq: Two times a day (BID) | ORAL | 0 refills | Status: AC

## 2016-06-28 MED ORDER — SULFAMETHOXAZOLE-TRIMETHOPRIM 800-160 MG PO TABS
1.0000 | ORAL_TABLET | Freq: Once | ORAL | Status: AC
  Administered 2016-06-28: 1 via ORAL
  Filled 2016-06-28: qty 1

## 2016-06-28 MED ORDER — NAPROXEN 250 MG PO TABS
500.0000 mg | ORAL_TABLET | Freq: Once | ORAL | Status: AC
  Administered 2016-06-28: 500 mg via ORAL
  Filled 2016-06-28: qty 2

## 2016-06-28 MED ORDER — CEPHALEXIN 250 MG PO CAPS
500.0000 mg | ORAL_CAPSULE | Freq: Once | ORAL | Status: AC
  Administered 2016-06-28: 500 mg via ORAL
  Filled 2016-06-28: qty 2

## 2016-06-28 NOTE — Discharge Instructions (Signed)
SEE YOUR GYNECOLOGIST FOR FURTHER EVALUATION AND MANAGEMENT OF LEFT BREAST PAIN THIS WEEK.

## 2016-06-28 NOTE — MAU Note (Signed)
Pt reports since Tuesday her left breast has been swollen and painful. [facility] and was started on antibiotics and pain meds. States the swelling and the pain have worsened and the provider there told her to come here if it worsened.

## 2016-06-28 NOTE — MAU Provider Note (Signed)
History     CSN: [id number]  Arrival date and time: 06/28/16 2214   First Provider Initiated Contact with Patient 06/28/16 2313      Chief Complaint  Patient presents with  . Breast Pain   Barbara Ortiz is a 25 y.o. [id number] who presents today with breast pain. She was seen in the ED last night, and was started on Keflex. She states that she has had 4 doses today, and the area has gotten worse. She reports that it is more swollen, and there is more redness today.    Abscess  This is a new problem. The current episode started yesterday. The problem occurs constantly. The problem has been gradually worsening. Pertinent negatives include no chills, fever, nausea or vomiting. Nothing aggravates the symptoms. Treatments tried: Kelfex started 24 hours ago.  The treatment provided no relief.     Past Medical History:  Diagnosis Date  . 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 Bronchitis     Past Surgical History:  Procedure Laterality Date  . CESAREAN SECTION      Family History  Problem Relation Age of Onset  . Hypertension Mother   . Diabetes Maternal Grandmother   . Hypertension Maternal Grandmother   . Diabetes Maternal Grandfather   . Hypertension Maternal Grandfather   . Cancer Paternal Grandmother     Social History  Substance Use Topics  . Smoking status: Never Smoker  . Smokeless tobacco: Never Used  . Alcohol use No    Allergies: No Known Allergies  Prescriptions Prior to Admission  Medication Sig Dispense Refill Last Dose  . cephALEXin (KEFLEX) 500 MG capsule Take 1 capsule (500 mg total) by mouth 4 (four) times daily. 28 capsule 0 06/28/2016 at 1200  . naproxen (NAPROSYN) 375 MG tablet Take 1 tablet (375 mg total) by mouth 2 (two) times daily. 20 tablet 0 06/27/2016 at Unknown time    Review of Systems  Constitutional: Negative for chills and fever.  Gastrointestinal:  Negative for nausea and vomiting.  Genitourinary: Negative for dysuria, frequency and urgency.   Physical Exam   Blood pressure 133/71, pulse 81, temperature 97.6 F (36.4 C), temperature source Oral, resp. rate 18, height 5\' 5"  (1.651 m), weight 172 lb 6 oz (78.2 kg), last menstrual period 06/09/2016, SpO2 100 %, not currently breastfeeding.  Physical Exam  Nursing note and vitals reviewed. Constitutional: She is oriented to person, place, and time. She appears well-developed and well-nourished. No distress.  HENT:  Head: Normocephalic.  Cardiovascular: Normal rate.   Respiratory: Effort normal. Right breast exhibits no inverted nipple, no mass, no nipple discharge, no skin change and no tenderness. Left breast exhibits mass, skin change and tenderness. Left breast exhibits no inverted nipple and no nipple discharge.    5cmx5cm fluctuant mass on the left breast that extends from the areola upward.   GI: There is no tenderness.  Neurological: She is alert and oriented to person, place, and time.  Skin: Skin is dry.  Psychiatric: She has a normal mood and affect.    MAU Course  Procedures  MDM 2321: D/W Dr. Despina HiddenEure, he can't address the abscess as it needs to be addressed by general surgery. Will give patient info for [facility]   Assessment and Plan   1. Breast abscess of female    DC home Comfort measures reviewed  RX: bactrim BID x 7 days  Return to  MAU as needed FU with OB as planned  Follow-up Information    [facility] Follow up.   Why:  [phone] Contact information: [address] [place] [phone]           Tawnya CrookHogan, Yanina Knupp Donovan 06/28/2016, 11:19 PM

## 2016-06-28 NOTE — Discharge Instructions (Signed)
Skin Abscess A skin abscess is an infected area on or under your skin that contains a collection of pus and other material. An abscess may also be called a furuncle, carbuncle, or boil. An abscess can occur in or on almost any part of your body. Some abscesses break open (rupture) on their own. Most continue to get worse unless they are treated. The infection can spread deeper into the body and eventually into your blood, which can make you feel ill. Treatment usually involves draining the abscess. What are the causes? An abscess occurs when germs, often bacteria, pass through your skin and cause an infection. This may be caused by:  A scrape or cut on your skin.  A puncture wound through your skin, including a needle injection.  Blocked oil or sweat glands.  Blocked and infected hair follicles.  A cyst that forms beneath your skin (sebaceous cyst) and becomes infected. What increases the risk? This condition is more likely to develop in people who:  Have a weak body defense system (immune system).  Have diabetes.  Have dry and irritated skin.  Get frequent injections or use illegal IV drugs.  Have a foreign body in a wound, such as a splinter.  Have problems with their lymph system or veins. What are the signs or symptoms? An abscess may start as a painful, firm bump under the skin. Over time, the abscess may get larger or become softer. Pus may appear at the top of the abscess, causing pressure and pain. It may eventually break through the skin and drain. Other symptoms include:  Redness.  Warmth.  Swelling.  Tenderness.  A sore on the skin. How is this diagnosed? This condition is diagnosed based on your medical history and a physical exam. A sample of pus may be taken from the abscess to find out what is causing the infection and what antibiotics can be used to treat it. You also may have:  Blood tests to look for signs of infection or spread of an infection to your  blood.  Imaging studies such as ultrasound, CT scan, or MRI if the abscess is deep. How is this treated? Small abscesses that drain on their own may not need treatment. Treatment for an abscess that does not rupture on its own may include:  Warm compresses applied to the area several times per day.  Incision and drainage. Your health care provider will make an incision to open the abscess and will remove pus and any foreign body or dead tissue. The incision area may be packed with gauze to keep it open for a few days while it heals.  Antibiotic medicines to treat infection. For a severe abscess, you may first get antibiotics through an IV and then change to oral antibiotics. Follow these instructions at home: Abscess Care   If you have an abscess that has not drained, place a warm, clean, wet washcloth over the abscess several times a day. Do this as told by your health care provider.  Follow instructions from your health care provider about how to take care of your abscess. Make sure you:  Cover the abscess with a bandage (dressing).  Change your dressing or gauze as told by your health care provider.  Wash your hands with soap and water before you change the dressing or gauze. If soap and water are not available, use hand sanitizer.  Check your abscess every day for signs of a worsening infection. Check for:  More redness, swelling, or   pain.  More fluid or blood.  Warmth.  More pus or a bad smell. Medicines   Take over-the-counter and prescription medicines only as told by your health care provider.  If you were prescribed an antibiotic medicine, take it as told by your health care provider. Do not stop taking the antibiotic even if you start to feel better. General instructions   To avoid spreading the infection:  Do not share personal care items, towels, or hot tubs with others.  Avoid making skin contact with other people.  Keep all follow-up visits as told by your  health care provider. This is important. Contact a health care provider if:  You have more redness, swelling, or pain around your abscess.  You have more fluid or blood coming from your abscess.  Your abscess feels warm to the touch.  You have more pus or a bad smell coming from your abscess.  You have a fever.  You have muscle aches.  You have chills or a general ill feeling. Get help right away if:  You have severe pain.  You see red streaks on your skin spreading away from the abscess. This information is not intended to replace advice given to you by your health care provider. Make sure you discuss any questions you have with your health care provider. Document Released: 04/12/2005 Document Revised: 02/27/2016 Document Reviewed: 05/12/2015 Elsevier Interactive Patient Education  2017 Elsevier Inc.  

## 2016-06-28 NOTE — ED Notes (Signed)
Patient Alert and oriented X4. Stable and ambulatory. Patient verbalized understanding of the discharge instructions.  Patient belongings were taken by the patient.  

## 2016-06-28 NOTE — ED Provider Notes (Signed)
[facility] Provider Note   CSN: [id number] Arrival date & time: 06/27/16  2144     History   Chief Complaint Chief Complaint  Patient presents with  . breast swelling    HPI Veatrice BourbonDavida Renee Pennino is a 25 y.o. female.  Otherwise healthy patient presents with complaint of left breast pain x 2 days. No fever, redness, nipple discharge or bleeding. There is mild breast swelling. No nausea or vomiting.    The history is provided by the patient. No language interpreter was used.    Past Medical History:  Diagnosis Date  . Bronchitis     Patient Active Problem List   Diagnosis Date Noted  . Well woman exam with routine gynecological exam 01/25/2016  . History of abnormal cervical Pap smear 01/25/2016  . SAB (spontaneous abortion) 12/28/2015  . Pap smear of cervix with ASCUS, cannot exclude HGSIL 09/22/2015  . 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 CESAREAN SECTION      OB History    Gravida Para Term Preterm AB Living   6 4 4  0 2 4   SAB TAB Ectopic Multiple Live Births   2 0 0 0 3       Home Medications    Prior to Admission medications   Medication Sig Start Date End Date Taking? Authorizing Provider  etonogestrel-ethinyl estradiol (NUVARING) 0.12-0.015 MG/24HR vaginal ring Insert vaginally and leave in place for 4 consecutive weeks, then remove and replace. 01/25/16   Rachelle A Denney, CNM  ibuprofen (ADVIL,MOTRIN) 600 MG tablet Take 1 tablet (600 mg total) by mouth every 6 (six) hours as needed. Patient not taking: Reported on 03/09/2016 12/17/15   Judeth HornErin Lawrence, NP  metroNIDAZOLE (FLAGYL) 500 MG tablet Take 1 tablet (500 mg total) by mouth 2 (two) times daily. Patient not taking: Reported on 03/09/2016 01/28/16   Rachelle A Denney, CNM  Prenat-FeAsp-Meth-FA-DHA w/o A (PRENATE PIXIE) 10-0.6-0.4-200 MG CAPS Take 1 tablet by mouth daily. Patient not taking: Reported on 03/09/2016 01/25/16   Roe Coombsachelle A Denney,  CNM    Family History Family History  Problem Relation Age of Onset  . Hypertension Mother   . Diabetes Maternal Grandmother   . Hypertension Maternal Grandmother   . Diabetes Maternal Grandfather   . Hypertension Maternal Grandfather   . Cancer Paternal Grandmother     Social History Social History  Substance Use Topics  . Smoking status: Never Smoker  . Smokeless tobacco: Never Used  . Alcohol use No     Allergies   Patient has no known allergies.   Review of Systems Review of Systems  Constitutional: Negative for fever.  Respiratory: Negative for cough and shortness of breath.   Gastrointestinal: Negative for nausea and vomiting.  Genitourinary:       Left breast pain and swelling  Musculoskeletal: Negative for myalgias.     Physical Exam Updated Vital Signs BP 145/94 (BP Location: Right Arm)   Pulse 82   Temp 98.3 F (36.8 C) (Oral)   Resp 18   Ht 5\' 4"  (1.626 m)   Wt 78 kg   LMP 09/06/2015   SpO2 100%   BMI 29.52 kg/m   Physical Exam  Constitutional: She is oriented to person, place, and time. She appears well-developed and well-nourished.  Neck: Normal range of motion.  Pulmonary/Chest: Effort normal.  Left breast significantly tender. No noted swelling. No induration or erythema. Tenderness is equal over breast. Nipple unremarkable  in appearance without discharge or bleeding.   Lymphadenopathy:       Left axillary: No pectoral adenopathy present. Neurological: She is alert and oriented to person, place, and time.  Skin: Skin is warm and dry.  Bilateral star tattoos surrounding areola bilaterally.     ED Treatments / Results  Labs (all labs ordered are listed, but only abnormal results are displayed) Labs Reviewed  PROLACTIN  POC URINE PREG, ED    EKG  EKG Interpretation None       Radiology No results found.  Procedures Procedures (including critical care time)  Medications Ordered in ED Medications  cephALEXin (KEFLEX)  capsule 500 mg (not administered)  naproxen (NAPROSYN) tablet 500 mg (not administered)     Initial Impression / Assessment and Plan / ED Course  I have reviewed the triage vital signs and the nursing notes.  Pertinent labs & imaging results that were available during my care of the patient were reviewed by me and considered in my medical decision making (see chart for details).  Clinical Course     Patient with unilateral breast pain x 2 days. No evidence abscess. Will cover with Keflex for potential breast infection, Naproxen for pain and inflammation. Refer to GYN for further outpatient evaluation.  Final Clinical Impressions(s) / ED Diagnoses   Final diagnoses:  None   1. Left breast pain  New Prescriptions New Prescriptions   No medications on file     Elpidio AnisShari Jesscia Imm, Cordelia Poche-C 06/28/16 0053    Dione Boozeavid Glick, MD 06/28/16 [phone]

## 2016-06-29 LAB — PROLACTIN: Prolactin: 13.4 ng/mL (ref 4.8–23.3)

## 2016-06-30 ENCOUNTER — Encounter: Payer: Self-pay | Admitting: Obstetrics

## 2016-06-30 ENCOUNTER — Ambulatory Visit (INDEPENDENT_AMBULATORY_CARE_PROVIDER_SITE_OTHER): Payer: Medicaid Other | Admitting: Obstetrics

## 2016-06-30 VITALS — BP 126/88 | HR 79 | Temp 98.4°F | Wt 170.9 lb

## 2016-06-30 DIAGNOSIS — N611 Abscess of the breast and nipple: Secondary | ICD-10-CM | POA: Diagnosis not present

## 2016-06-30 MED ORDER — AMOXICILLIN-POT CLAVULANATE 875-125 MG PO TABS: 1.0000 | ORAL_TABLET | Freq: Two times a day (BID) | ORAL | 0 refills | Status: DC

## 2016-06-30 MED ORDER — IBUPROFEN 800 MG PO TABS: 800.0000 mg | ORAL_TABLET | Freq: Three times a day (TID) | ORAL | 5 refills | Status: DC | PRN

## 2016-06-30 NOTE — Patient Instructions (Addendum)
° °Mastitis °Mastitis is inflammation of the breast tissue. It occurs most often in women who are breastfeeding, but it can also affect other women, and even sometimes men. °CAUSES  °Mastitis is usually caused by a bacterial infection. Bacteria enter the breast tissue through cuts or openings in the skin. Typically, this occurs with breastfeeding because of cracked or irritated skin. Sometimes, it can occur even when there is no opening in the skin. It can be associated with plugged milk (lactiferous) ducts. Nipple piercing can also lead to mastitis. Also, some forms of breast cancer can cause mastitis. °SIGNS AND SYMPTOMS  °· Swelling, redness, tenderness, and pain in an area of the breast. °· Swelling of the glands under the arm on the same side. °· Fever. °If an infection is allowed to progress, a collection of pus (abscess) may develop. °DIAGNOSIS  °Your health care provider can usually diagnose mastitis based on your symptoms and a physical exam. Tests may be done to help confirm the diagnosis. These may include:  °· Removal of pus from the breast by applying pressure to the area. This pus can be examined in the lab to determine which bacteria are present. If an abscess has developed, the fluid in the abscess can be removed with a needle. This can also be used to confirm the diagnosis and determine the bacteria present. In most cases, pus will not be present. °· Blood tests to determine if your body is fighting a bacterial infection. °· Mammogram or ultrasound tests to rule out other problems or diseases. °TREATMENT  °Antibiotic medicine is used to treat a bacterial infection. Your health care provider will determine which bacteria are most likely causing the infection and will select an appropriate antibiotic. This is sometimes changed based on the results of tests performed to identify the bacteria, or if there is no response to the antibiotic selected. Antibiotics are usually given by mouth. You may also be  given medicine for pain. °Mastitis that occurs with breastfeeding will sometimes go away on its own, so your health care provider may choose to wait 24 hours after first seeing you to decide whether a prescription medicine is needed. °HOME CARE INSTRUCTIONS  °· Only take over-the-counter or prescription medicines for pain, fever, or discomfort as directed by your health care provider. °· If your health care provider prescribed an antibiotic, take the medicine as directed. Make sure you finish it even if you start to feel better. °· Do not wear a tight or underwire bra. Wear a soft, supportive bra. °· Increase your fluid intake, especially if you have a fever. °· Women who are breastfeeding should follow these instructions: °¨ Continue to empty the breast. Your health care provider can tell you whether this milk is safe for your infant or needs to be thrown out. You may be told to stop nursing until your health care provider thinks it is safe for your baby. Use a breast pump if you are advised to stop nursing. °¨ Keep your nipples clean and dry. °¨ Empty the first breast completely before going to the other breast. If your baby is not emptying your breasts completely for some reason, use a breast pump to empty your breasts. °¨ If you go back to work, pump your breasts while at work to stay in time with your nursing schedule. °¨ Avoid allowing your breasts to become overly filled with milk (engorged). °SEEK MEDICAL CARE IF:  °· You have pus-like discharge from the breast. °· Your symptoms do   not improve with the treatment prescribed by your health care provider within 2 days. °SEEK IMMEDIATE MEDICAL CARE IF:  °· Your pain and swelling are getting worse. °· You have pain that is not controlled with medicine. °· You have a red line extending from the breast toward your armpit. °· You have a fever or persistent symptoms for more than 2-3 days. °· You have a fever and your symptoms suddenly get worse. °This information is  not intended to replace advice given to you by your health care provider. Make sure you discuss any questions you have with your health care provider. °Document Released: 07/03/2005 Document Revised: 07/08/2013 Document Reviewed: 01/31/2013 °Elsevier Interactive Patient Education © 2017 Elsevier Inc. ° ° °

## 2016-06-30 NOTE — Progress Notes (Signed)
Patient ID: Barbara Ortiz, female   DOB: May 14, 1991, 25 y.o.   MRN: [id number]  Chief Complaint  Patient presents with  . other    Left breast swollen and swore    HPI Barbara BourbonDavida Renee Cowie is a 25 y.o. female.  Enlarged, firm and tender left breast since Monday 06-26-16.  Has also had a fever.  Went to ER and was started on Bactrim DS and told to follow up with Gyn.  States that the left breast is a little less enlarged and tender since starting antibiotic. HPI  Past Medical History:  Diagnosis Date  . 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 Bronchitis     Past Surgical History:  Procedure Laterality Date  . CESAREAN SECTION      Family History  Problem Relation Age of Onset  . Hypertension Mother   . Diabetes Maternal Grandmother   . Hypertension Maternal Grandmother   . Diabetes Maternal Grandfather   . Hypertension Maternal Grandfather   . Cancer Paternal Grandmother     Social History Social History  Substance Use Topics  . Smoking status: Never Smoker  . Smokeless tobacco: Never Used  . Alcohol use No    No Known Allergies  Current Outpatient Prescriptions  Medication Sig Dispense Refill  . sulfamethoxazole-trimethoprim (BACTRIM DS,SEPTRA DS) 800-160 MG tablet Take 1 tablet by mouth 2 (two) times daily. 14 tablet 0  . amoxicillin-clavulanate (AUGMENTIN) 875-125 MG tablet Take 1 tablet by mouth 2 (two) times daily. 28 tablet 0  . ibuprofen (ADVIL,MOTRIN) 800 MG tablet Take 1 tablet (800 mg total) by mouth every 8 (eight) hours as needed. 30 tablet 5   No current facility-administered medications for this visit.     Review of Systems Review of Systems Constitutional: negative for fatigue and weight loss Respiratory: negative for cough and wheezing Cardiovascular: negative for chest pain, fatigue and palpitations Gastrointestinal: negative for abdominal pain and change in bowel  habits Genitourinary:negative Integument/breast: positive for enlarged, firm and tender left breast.  negative for nipple discharge Musculoskeletal:negative for myalgias Neurological: negative for gait problems and tremors Behavioral/Psych: negative for abusive relationship, depression Endocrine: negative for temperature intolerance      Blood pressure 126/88, pulse 79, temperature 98.4 F (36.9 C), temperature source Oral, weight 170 lb 14.4 oz (77.5 kg), last menstrual period 06/09/2016.  Physical Exam Physical Exam General:   alert  Skin:   no rash or abnormalities  Lungs:   clear to auscultation bilaterally  Heart:   regular rate and rhythm, S1, S2 normal, no murmur, click, rub or gallop  Breasts:   Left breast enlarged, firm and tender.  No nipple discharge.   50% of 15 min visit spent on counseling and coordination of care.    Data Reviewed Labs  Assessment     Left breast abscess    Plan    Continue Bactrim DS Augmentin Rx Left breast ultrasound ordered Referred to General Surgery Follow up in 2 weeks.  Orders Placed This Encounter  Procedures  . US BREAST COMPLETE UNI LEFT INC AXILLA    Standing Status:   Future    Standing Expiration Date:   08/31/2017    Order Specific Question:   Reason for Exam (SYMPTOM  OR DIAGNOSIS REQUIRED)    Answer:   Left breast abscess    Order Specific Question:   [facility]?    [facility]  .  Ambulatory referral to General Surgery    Referral Priority:   Routine    Referral Type:   Surgical    Referral Reason:   Specialty Services Required    Requested Specialty:   General Surgery    Number of Visits Requested:   1   Meds ordered this encounter  Medications  . amoxicillin-clavulanate (AUGMENTIN) 875-125 MG tablet    Sig: Take 1 tablet by mouth 2 (two) times daily.    Dispense:  28 tablet    Refill:  0  . ibuprofen (ADVIL,MOTRIN) 800 MG tablet    Sig: Take 1 tablet (800 mg total) by mouth  every 8 (eight) hours as needed.    Dispense:  30 tablet    Refill:  5

## 2016-07-03 NOTE — Addendum Note (Signed)
Addended by: Maretta BeesMCGLASHAN, Jamiyah Dingley J on: 07/03/2016 02:43 PM   Modules accepted: Orders

## 2016-07-04 ENCOUNTER — Other Ambulatory Visit: Payer: Self-pay | Admitting: Obstetrics

## 2016-07-04 ENCOUNTER — Other Ambulatory Visit: Payer: Self-pay

## 2016-07-04 DIAGNOSIS — N611 Abscess of the breast and nipple: Secondary | ICD-10-CM

## 2016-07-05 ENCOUNTER — Ambulatory Visit
Admission: RE | Admit: 2016-07-05 | Discharge: 2016-07-05 | Disposition: A | Discharge status: Home / Self Care | Payer: Medicaid Other | Source: Ambulatory Visit | Attending: Obstetrics | Admitting: Obstetrics

## 2016-07-05 ENCOUNTER — Ambulatory Visit: Payer: Medicaid Other | Admitting: Obstetrics

## 2016-07-05 DIAGNOSIS — N611 Abscess of the breast and nipple: Secondary | ICD-10-CM

## 2016-07-17 ENCOUNTER — Emergency Department (HOSPITAL_COMMUNITY)
Admission: EM | Admit: 2016-07-17 | Discharge: 2016-07-17 | Disposition: A | Discharge status: Home / Self Care | Payer: Medicaid Other | Attending: Emergency Medicine | Admitting: Emergency Medicine

## 2016-07-17 ENCOUNTER — Encounter (HOSPITAL_COMMUNITY): Payer: Self-pay | Admitting: *Deleted

## 2016-07-17 DIAGNOSIS — R112 Nausea with vomiting, unspecified: Secondary | ICD-10-CM | POA: Diagnosis not present

## 2016-07-17 DIAGNOSIS — Z79899 Other long term (current) drug therapy: Secondary | ICD-10-CM | POA: Insufficient documentation

## 2016-07-17 LAB — URINALYSIS, ROUTINE W REFLEX MICROSCOPIC
BILIRUBIN URINE: NEGATIVE
Bacteria, UA: NONE SEEN
GLUCOSE, UA: NEGATIVE mg/dL
Hgb urine dipstick: NEGATIVE
KETONES UR: NEGATIVE mg/dL
LEUKOCYTES UA: NEGATIVE
Nitrite: NEGATIVE
PH: 7 (ref 5.0–8.0)
Protein, ur: 30 mg/dL — AB
SPECIFIC GRAVITY, URINE: 1.025 (ref 1.005–1.030)

## 2016-07-17 LAB — PREGNANCY, URINE: Preg Test, Ur: NEGATIVE

## 2016-07-17 MED ORDER — SODIUM CHLORIDE 0.9 % IV BOLUS (SEPSIS)
1000.0000 mL | Freq: Once | INTRAVENOUS | Status: AC
  Administered 2016-07-17: 1000 mL via INTRAVENOUS

## 2016-07-17 MED ORDER — ONDANSETRON HCL 4 MG/2ML IJ SOLN
4.0000 mg | Freq: Once | INTRAMUSCULAR | Status: AC
  Administered 2016-07-17: 4 mg via INTRAVENOUS
  Filled 2016-07-17: qty 2

## 2016-07-17 MED ORDER — ONDANSETRON 4 MG PO TBDP
4.0000 mg | ORAL_TABLET | Freq: Once | ORAL | Status: AC | PRN
  Administered 2016-07-17: 4 mg via ORAL
  Filled 2016-07-17: qty 1

## 2016-07-17 MED ORDER — ONDANSETRON 4 MG PO TBDP: 4.0000 mg | ORAL_TABLET | Freq: Three times a day (TID) | ORAL | 0 refills | Status: DC | PRN

## 2016-07-17 NOTE — ED Triage Notes (Signed)
Pt states emesis since this am, unable to keep anything down, reports void once today, denies fever

## 2016-07-17 NOTE — ED Provider Notes (Signed)
[facility] Provider Note   CSN: [id number] Arrival date & time: 07/17/16  1903     History   Chief Complaint Chief Complaint  Patient presents with  . Emesis    HPI Barbara Ortiz is a 26 y.o. female.  HPI Patient had nausea and vomiting today. States she's been able keep things down. No real appetite. No fevers. No diarrhea. No abdominal pain. No sick contacts. Chest pain.   Past Medical History:  Diagnosis Date  . 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 Bronchitis     Patient Active Problem List   Diagnosis Date Noted  . Well woman exam with routine gynecological exam 01/25/2016  . History of abnormal cervical Pap smear 01/25/2016  . SAB (spontaneous abortion) 12/28/2015  . Pap smear of cervix with ASCUS, cannot exclude HGSIL 09/22/2015  . 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 CESAREAN SECTION      OB History    Gravida Para Term Preterm AB Living   6 4 4  0 2 4   SAB TAB Ectopic Multiple Live Births   2 0 0 0 3       Home Medications    Prior to Admission medications   Medication Sig Start Date End Date Taking? Authorizing Provider  amoxicillin-clavulanate (AUGMENTIN) 875-125 MG tablet Take 1 tablet by mouth 2 (two) times daily. 06/30/16   Brock Badharles A Harper, MD  ibuprofen (ADVIL,MOTRIN) 800 MG tablet Take 1 tablet (800 mg total) by mouth every 8 (eight) hours as needed. 06/30/16   Brock Badharles A Harper, MD  ondansetron (ZOFRAN-ODT) 4 MG disintegrating tablet Take 1 tablet (4 mg total) by mouth every 8 (eight) hours as needed for nausea or vomiting. 07/17/16   Benjiman CoreNathan Zacharie Portner, MD    Family History Family History  Problem Relation Age of Onset  . Hypertension Mother   . Diabetes Maternal Grandmother   . Hypertension Maternal Grandmother   . Diabetes Maternal Grandfather   . Hypertension Maternal Grandfather   . Cancer  Paternal Grandmother     Social History Social History  Substance Use Topics  . Smoking status: Never Smoker  . Smokeless tobacco: Never Used  . Alcohol use 0.0 oz/week     Allergies   Patient has no known allergies.   Review of Systems Review of Systems  Constitutional: Positive for appetite change. Negative for activity change.  HENT: Negative for congestion.   Eyes: Negative for pain.  Respiratory: Negative for chest tightness and shortness of breath.   Cardiovascular: Negative for chest pain and leg swelling.  Gastrointestinal: Positive for nausea and vomiting. Negative for abdominal pain and diarrhea.  Genitourinary: Negative for flank pain.  Musculoskeletal: Negative for back pain.  Skin: Negative for rash and wound.  Neurological: Positive for light-headedness. Negative for weakness, numbness and headaches.  Psychiatric/Behavioral: Negative for behavioral problems.     Physical Exam Updated Vital Signs BP 124/67   Pulse 66   Temp 99.1 F (37.3 C) (Temporal)   Resp 18   Wt 170 lb 1.6 oz (77.2 kg)   LMP 07/09/2016 (Approximate)   SpO2 100%   BMI 28.31 kg/m   Physical Exam  Constitutional: She appears well-developed.  HENT:  Head: Atraumatic.  Eyes: EOM are normal.  Neck: Neck supple.  Cardiovascular: Normal rate.   Pulmonary/Chest: Effort normal.  Abdominal: Soft. There is no tenderness.  Musculoskeletal:  She exhibits no edema.  Neurological: She is alert.  Skin: Skin is warm. Capillary refill takes less than 2 seconds.  Psychiatric: She has a normal mood and affect.     ED Treatments / Results  Labs (all labs ordered are listed, but only abnormal results are displayed) Labs Reviewed  URINALYSIS, ROUTINE W REFLEX MICROSCOPIC - Abnormal; Notable for the following:       Result Value   APPearance HAZY (*)    Protein, ur 30 (*)    Squamous Epithelial / LPF 0-5 (*)    All other components within normal limits  PREGNANCY, URINE    EKG  EKG  Interpretation None       Radiology No results found.  Procedures Procedures (including critical care time)  Medications Ordered in ED Medications  ondansetron (ZOFRAN-ODT) disintegrating tablet 4 mg (4 mg Oral Given 07/17/16 1933)  sodium chloride 0.9 % bolus 1,000 mL (0 mLs Intravenous Stopped 07/17/16 2254)  ondansetron (ZOFRAN) injection 4 mg (4 mg Intravenous Given 07/17/16 2310)     Initial Impression / Assessment and Plan / ED Course  I have reviewed the triage vital signs and the nursing notes.  Pertinent labs & imaging results that were available during my care of the patient were reviewed by me and considered in my medical decision making (see chart for details).  Clinical Course     Patient with nausea and vomiting. Labs reassuring. Feels better after treatment is tolerating orals. Feels better after 1 liter IV fluid. Discharge home.  Final Clinical Impressions(s) / ED Diagnoses   Final diagnoses:  Non-intractable vomiting with nausea, unspecified vomiting type    New Prescriptions New Prescriptions   ONDANSETRON (ZOFRAN-ODT) 4 MG DISINTEGRATING TABLET    Take 1 tablet (4 mg total) by mouth every 8 (eight) hours as needed for nausea or vomiting.     Benjiman Core, MD 07/17/16 [phone]

## 2016-07-20 ENCOUNTER — Ambulatory Visit: Payer: Medicaid Other | Admitting: Obstetrics

## 2016-12-20 IMAGING — US US OB COMP LESS 14 WK
1 series · 15 of 28 positions shown · non-contrast
Comparison: None.

CLINICAL DATA: Vaginal bleeding and lower abdominal pain today.
Quantitative beta HCG is in progress. Estimated gestational age by
LMP is 4 weeks 1 day.

EXAM:
OBSTETRIC <14 WK US AND TRANSVAGINAL OB US
TECHNIQUE: Both transabdominal and transvaginal ultrasound examinations were
performed for complete evaluation of the gestation as well as the
maternal uterus, adnexal regions, and pelvic cul-de-sac.
Transvaginal technique was performed to assess early pregnancy.

[Series 1: us ob comp less 14 wk · 15 of 41 slices shown]
[im 1/41]
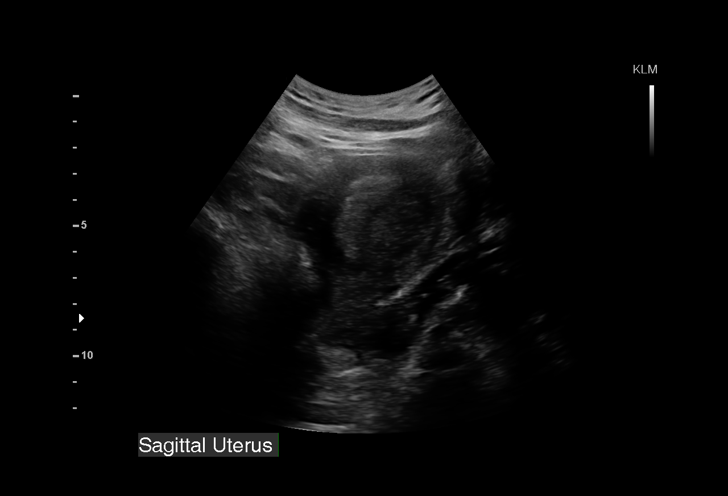
[im 3/41]
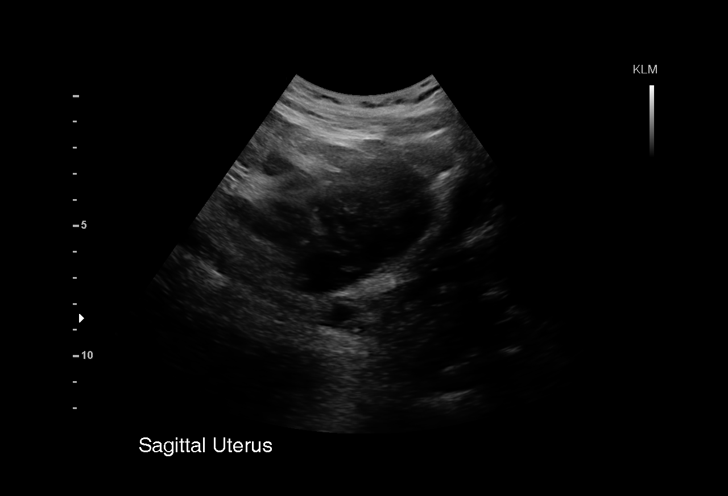
[im 6/41]
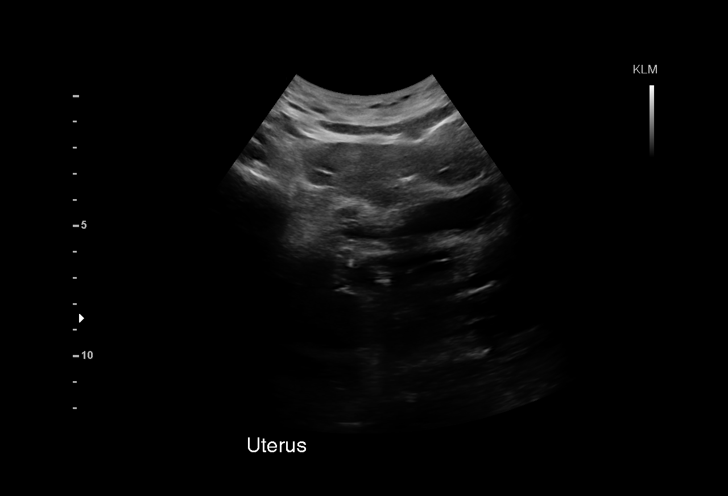
[im 9/41]
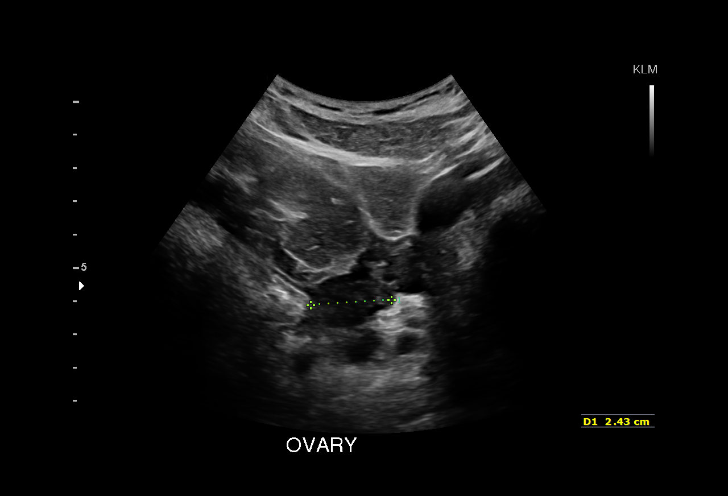
[im 12/41]
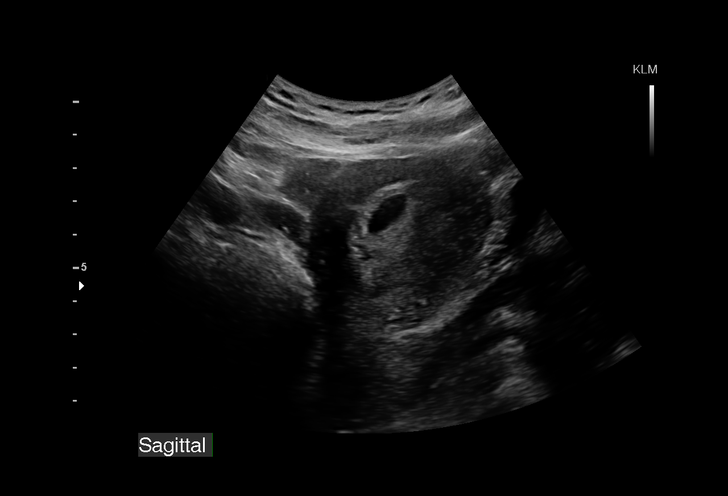
[im 15/41]
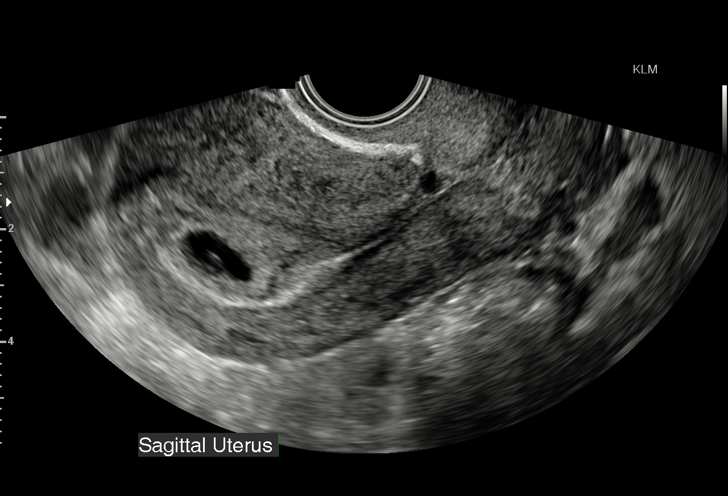
[im 18/41]
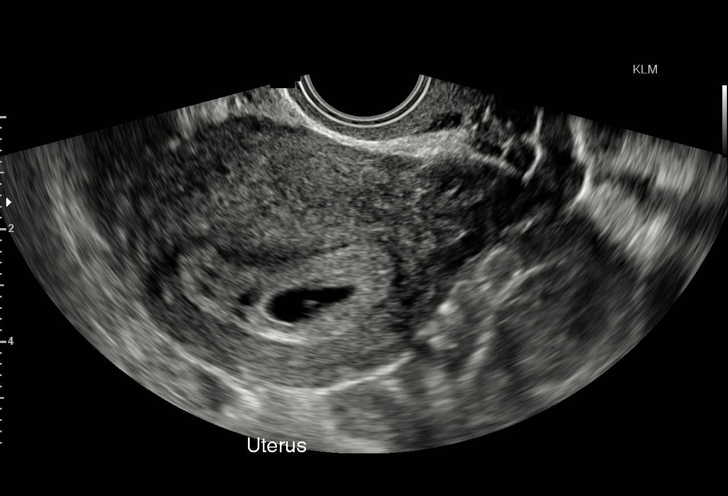
[im 21/41]
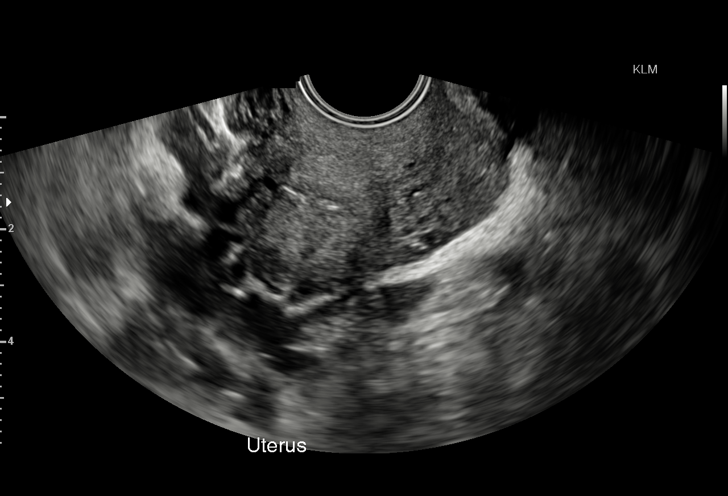
[im 23/41]
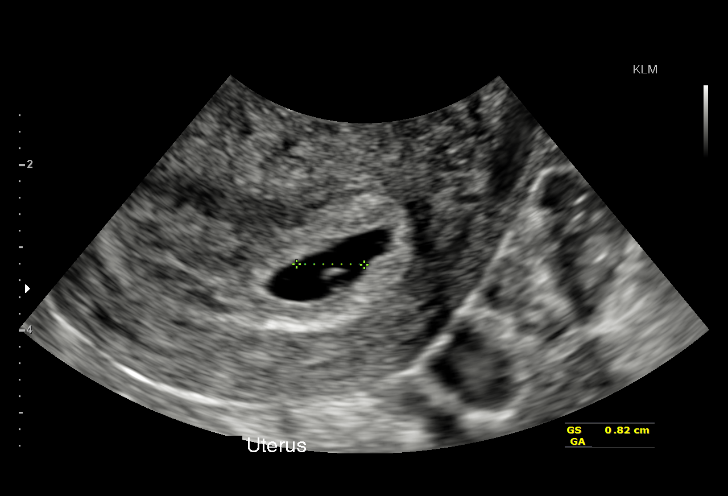
[im 26/41]
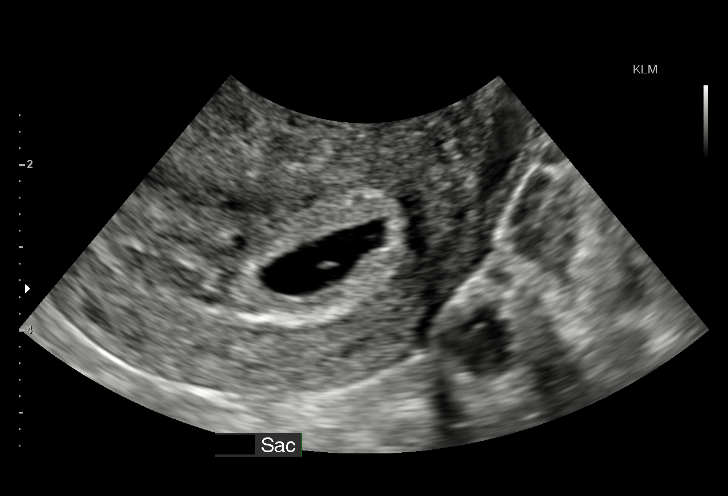
[im 29/41]
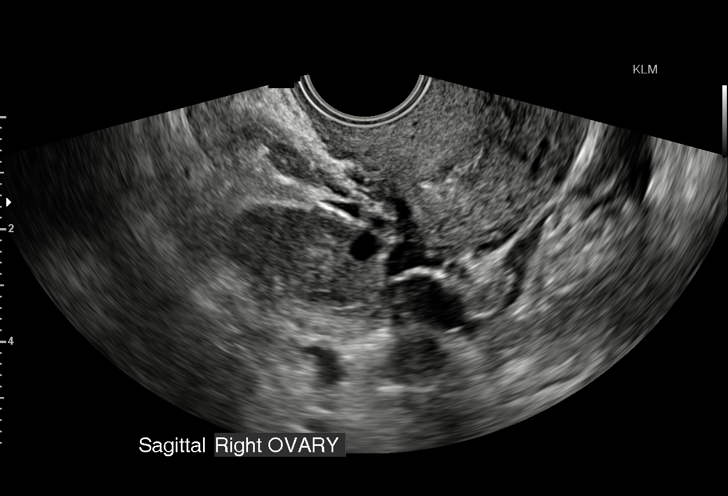
[im 32/41]
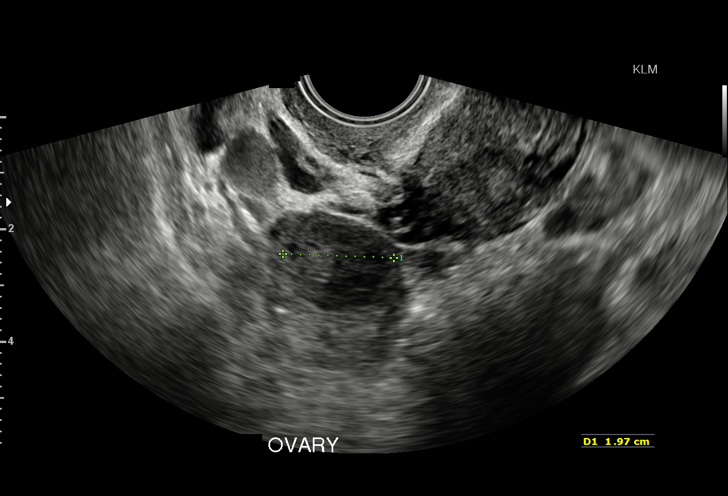
[im 35/41]
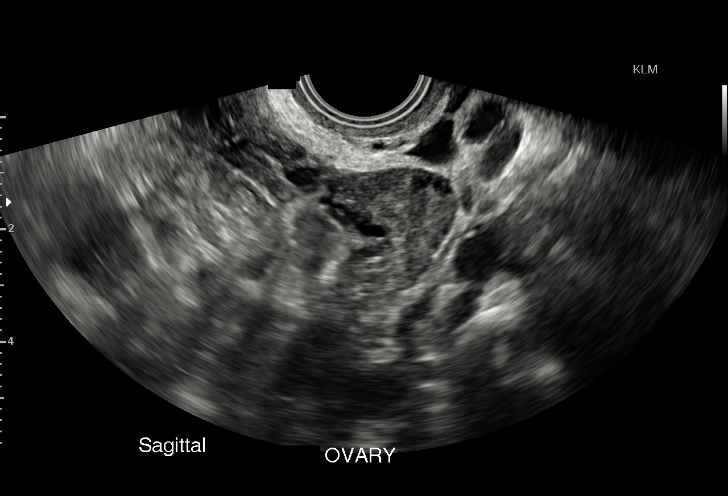
[im 38/41]
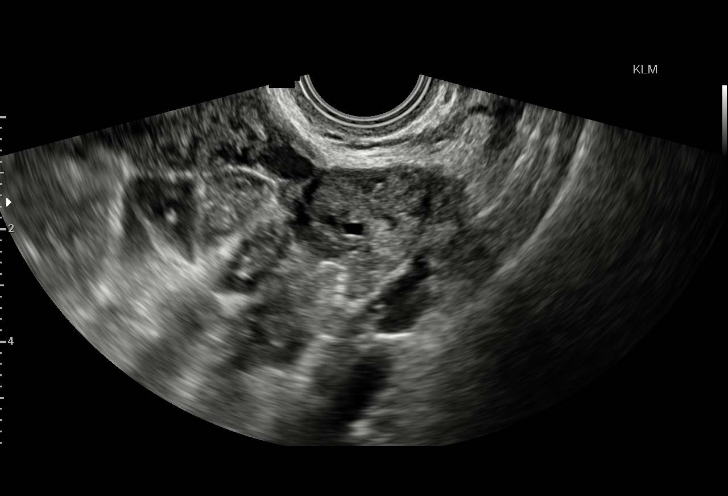
[im 41/41]
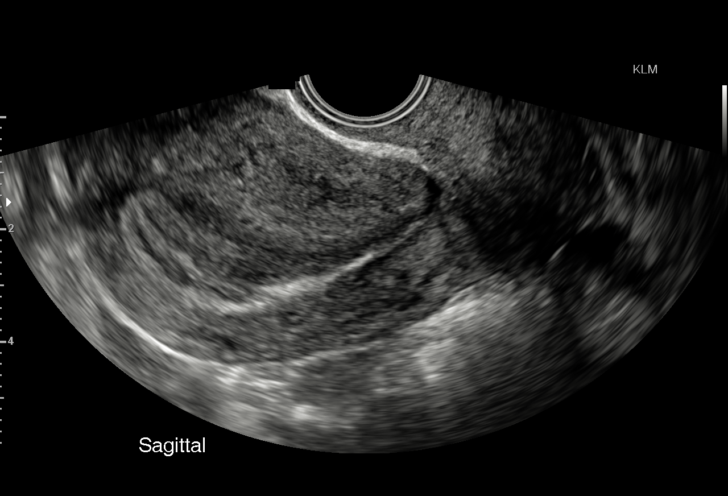

[15 of 28 positions shown; findings below may reference images not displayed]

FINDINGS: Intrauterine gestational sac: A single intrauterine gestational sac
is present.

Yolk sac:  Yolk sac is visualized.

Embryo:  Fetal pole is not identified.

Cardiac Activity: Not identified.

MSD: 9.1  mm   5 w   5  d

Subchorionic hemorrhage: Small subchorionic hemorrhages identified
superiorly.

Maternal uterus/adnexae: Uterus is anteverted. No myometrial mass
lesions identified. Small nabothian cysts in the cervix. Both
ovaries are identified and appear normal. No abnormal adnexal
masses. No free fluid collections.
IMPRESSION: Probable early intrauterine gestational sac with yolk sac, but no
fetal pole or cardiac activity yet visualized. Based on mean sac
diameter, estimated gestational age is 5 weeks 5 days. Small
subchorionic hemorrhage. Recommend follow-up quantitative B-HCG
levels and follow-up US in 14 days to confirm and assess viability.
This recommendation follows SRU consensus guidelines: Diagnostic
Criteria for Nonviable Pregnancy Early in the First Trimester. N
Engl J Med 2986; [DATE].

## 2017-01-24 ENCOUNTER — Ambulatory Visit: Payer: Self-pay | Admitting: Certified Nurse Midwife

## 2017-07-04 ENCOUNTER — Other Ambulatory Visit: Payer: Self-pay

## 2017-07-04 ENCOUNTER — Other Ambulatory Visit (HOSPITAL_COMMUNITY)
Admission: RE | Admit: 2017-07-04 | Discharge: 2017-07-04 | Disposition: A | Discharge status: Home / Self Care | Payer: Medicaid Other | Source: Ambulatory Visit | Attending: Certified Nurse Midwife | Admitting: Certified Nurse Midwife

## 2017-07-04 ENCOUNTER — Encounter: Payer: Self-pay | Admitting: Certified Nurse Midwife

## 2017-07-04 ENCOUNTER — Ambulatory Visit (INDEPENDENT_AMBULATORY_CARE_PROVIDER_SITE_OTHER): Payer: Medicaid Other | Admitting: Certified Nurse Midwife

## 2017-07-04 VITALS — BP 125/76 | HR 69 | Ht 64.0 in | Wt 172.3 lb

## 2017-07-04 DIAGNOSIS — Z01419 Encounter for gynecological examination (general) (routine) without abnormal findings: Secondary | ICD-10-CM

## 2017-07-04 DIAGNOSIS — N760 Acute vaginitis: Secondary | ICD-10-CM

## 2017-07-04 DIAGNOSIS — Z0000 Encounter for general adult medical examination without abnormal findings: Secondary | ICD-10-CM | POA: Diagnosis not present

## 2017-07-04 DIAGNOSIS — R5383 Other fatigue: Secondary | ICD-10-CM

## 2017-07-04 DIAGNOSIS — B9689 Other specified bacterial agents as the cause of diseases classified elsewhere: Secondary | ICD-10-CM

## 2017-07-04 DIAGNOSIS — Z113 Encounter for screening for infections with a predominantly sexual mode of transmission: Secondary | ICD-10-CM

## 2017-07-04 DIAGNOSIS — Z8742 Personal history of other diseases of the female genital tract: Secondary | ICD-10-CM

## 2017-07-04 MED ORDER — SECNIDAZOLE 2 G PO PACK: 1.0000 | PACK | Freq: Once | ORAL | 0 refills | Status: AC

## 2017-07-04 NOTE — Progress Notes (Signed)
Complains of discharge and odor x 7 days.

## 2017-07-04 NOTE — Progress Notes (Signed)
Subjective:        Barbara Ortiz is a 26 y.o. female here for a routine exam.  Current complaints:  Vaginal discharge with odor for 1 week.  Desires full STD screening.  Has H.S. And a dermatology appointment from her primary care physician for management.   Reports normal periods.   Personal health questionnaire:  Is patient Ashkenazi Jewish, have a family history of breast and/or ovarian cancer: no Is there a family history of uterine cancer diagnosed at age < 5550, gastrointestinal cancer, urinary tract cancer, family member who is a Personnel officerLynch syndrome-associated carrier: no Is the patient overweight and hypertensive, family history of diabetes, personal history of gestational diabetes, preeclampsia or PCOS: no Is patient over 5955, have PCOS,  family history of premature CHD under age 27, diabetes, smoke, have hypertension or peripheral artery disease:  no At any time, has a partner hit, kicked or otherwise hurt or frightened you?: no Over the past 2 weeks, have you felt down, depressed or hopeless?: no Over the past 2 weeks, have you felt little interest or pleasure in doing things?:no   Gynecologic History Patient's last menstrual period was 06/30/2017 (exact date). Contraception: abstinence Last Pap: 01/25/16. Results were: normal Last mammogram: n/a <40, no sig family hx.   Obstetric History OB History  Gravida Para Term Preterm AB Living  6 4 4  0 2 4  SAB TAB Ectopic Multiple Live Births  2 0 0 0 3    # Outcome Date GA Lbr Len/2nd Weight Sex Delivery Anes PTL Lv  6 Term 08/08/11    F Vag-Spont EPI  LIV  5 Term 08/03/10    F Vag-Spont EPI  LIV  4 Term 03/12/08    F Vag-Spont EPI  LIV  3 SAB           2 Term           1 SAB               Past Medical History:  Diagnosis Date  . 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 Bronchitis     Past Surgical History:  Procedure Laterality Date  .  CESAREAN SECTION       Current Outpatient Medications:  .  amoxicillin-clavulanate (AUGMENTIN) 875-125 MG tablet, Take 1 tablet by mouth 2 (two) times daily. (Patient not taking: Reported on 07/04/2017), Disp: 28 tablet, Rfl: 0 .  ibuprofen (ADVIL,MOTRIN) 800 MG tablet, Take 1 tablet (800 mg total) by mouth every 8 (eight) hours as needed. (Patient not taking: Reported on 07/04/2017), Disp: 30 tablet, Rfl: 5 .  ondansetron (ZOFRAN-ODT) 4 MG disintegrating tablet, Take 1 tablet (4 mg total) by mouth every 8 (eight) hours as needed for nausea or vomiting. (Patient not taking: Reported on 07/04/2017), Disp: 8 tablet, Rfl: 0 No Known Allergies  Social History   Tobacco Use  . Smoking status: Never Smoker  . Smokeless tobacco: Never Used  Substance Use Topics  . Alcohol use: Yes    Alcohol/week: 0.0 oz    Family History  Problem Relation Age of Onset  . Hypertension Mother   . Diabetes Maternal Grandmother   . Hypertension Maternal Grandmother   . Diabetes Maternal Grandfather   . Hypertension Maternal Grandfather   . Cancer Paternal Grandmother       Review of Systems  Constitutional: negative for fatigue and weight loss Respiratory: negative for cough and wheezing Cardiovascular:  negative for chest pain, fatigue and palpitations Gastrointestinal: negative for abdominal pain and change in bowel habits Musculoskeletal:negative for myalgias Neurological: negative for gait problems and tremors Behavioral/Psych: negative for abusive relationship, depression Endocrine: negative for temperature intolerance    Genitourinary:negative for abnormal menstrual periods, genital lesions, hot flashes, sexual problems and vaginal discharge Integument/breast: negative for breast lump, breast tenderness, nipple discharge and skin lesion(s)    Objective:       BP 125/76   Pulse 69   Ht 5\' 4"  (1.626 m)   Wt 172 lb 4.8 oz (78.2 kg)   LMP 06/30/2017 (Exact Date)   BMI 29.58 kg/m  General:    alert  Skin:   no rash or abnormalities  Lungs:   clear to auscultation bilaterally  Heart:   regular rate and rhythm, S1, S2 normal, no murmur, click, rub or gallop  Breasts:   normal without suspicious masses, skin or nipple changes or axillary nodes  Abdomen:  normal findings: no organomegaly, soft, non-tender and no hernia  Pelvis:  External genitalia: normal general appearance Urinary system: urethral meatus normal and bladder without fullness, nontender Vaginal: normal without tenderness, induration or masses Cervix: normal appearance Adnexa: normal bimanual exam Uterus: anteverted and non-tender, normal size   Lab Review Urine pregnancy test Labs reviewed yes Radiologic studies reviewed no  50% of 30 min visit spent on counseling and coordination of care.   Assessment & Plan    Healthy female exam.    1. Well woman exam with routine gynecological exam    - Cytology - PAP - Cervicovaginal ancillary only - Comprehensive metabolic panel - CBC with Differential/Platelet  2. History of abnormal cervical Pap smear     3. BV (bacterial vaginosis)    - Secnidazole (SOLOSEC) 2 g PACK; Take 1 Package by mouth once for 1 dose.  Dispense: 1 each; Refill: 0  4. Screening examination for STD (sexually transmitted disease)    - Cervicovaginal ancillary only - HIV antibody (with reflex) - RPR - Hepatitis C antibody - Hepatitis B surface antigen  5. Fatigue, unspecified type    - TSH   Education reviewed: calcium supplements, depression evaluation, low fat, low cholesterol diet, safe sex/STD prevention, self breast exams, skin cancer screening and weight bearing exercise. Contraception: abstinence. Follow up in: 1 year.   Meds ordered this encounter  Medications  . Secnidazole (SOLOSEC) 2 g PACK    Sig: Take 1 Package by mouth once for 1 dose.    Dispense:  1 each    Refill:  0   Orders Placed This Encounter  Procedures  . HIV antibody (with reflex)  . RPR  .  Hepatitis C antibody  . Hepatitis B surface antigen  . TSH  . Comprehensive metabolic panel  . CBC with Differential/Platelet   Follow up as needed.

## 2017-07-05 ENCOUNTER — Telehealth: Payer: Self-pay | Admitting: Pediatrics

## 2017-07-05 ENCOUNTER — Other Ambulatory Visit: Payer: Self-pay | Admitting: Certified Nurse Midwife

## 2017-07-05 LAB — CBC WITH DIFFERENTIAL/PLATELET
BASOS ABS: 0 10*3/uL (ref 0.0–0.2)
BASOS: 1 %
EOS (ABSOLUTE): 0 10*3/uL (ref 0.0–0.4)
Eos: 1 %
Hematocrit: 33 % — ABNORMAL LOW (ref 34.0–46.6)
Hemoglobin: 11.3 g/dL (ref 11.1–15.9)
IMMATURE GRANS (ABS): 0 10*3/uL (ref 0.0–0.1)
Immature Granulocytes: 0 %
LYMPHS ABS: 2.7 10*3/uL (ref 0.7–3.1)
LYMPHS: 52 %
MCH: 27.2 pg (ref 26.6–33.0)
MCHC: 34.2 g/dL (ref 31.5–35.7)
MCV: 79 fL (ref 79–97)
MONOS ABS: 0.4 10*3/uL (ref 0.1–0.9)
Monocytes: 7 %
NEUTROS ABS: 2 10*3/uL (ref 1.4–7.0)
Neutrophils: 39 %
PLATELETS: 316 10*3/uL (ref 150–379)
RBC: 4.16 x10E6/uL (ref 3.77–5.28)
RDW: 14.5 % (ref 12.3–15.4)
WBC: 5.3 10*3/uL (ref 3.4–10.8)

## 2017-07-05 LAB — COMPREHENSIVE METABOLIC PANEL
A/G RATIO: 1.3 (ref 1.2–2.2)
ALBUMIN: 4.2 g/dL (ref 3.5–5.5)
ALT: 9 IU/L (ref 0–32)
AST: 13 IU/L (ref 0–40)
Alkaline Phosphatase: 57 IU/L (ref 39–117)
BUN / CREAT RATIO: 14 (ref 9–23)
BUN: 10 mg/dL (ref 6–20)
CALCIUM: 8.9 mg/dL (ref 8.7–10.2)
CHLORIDE: 101 mmol/L (ref 96–106)
CO2: 24 mmol/L (ref 20–29)
Creatinine, Ser: 0.71 mg/dL (ref 0.57–1.00)
GFR, EST AFRICAN AMERICAN: 135 mL/min/{1.73_m2} (ref >59)
GFR, EST NON AFRICAN AMERICAN: 117 mL/min/{1.73_m2} (ref >59)
GLOBULIN, TOTAL: 3.3 g/dL (ref 1.5–4.5)
Glucose: 76 mg/dL (ref 65–99)
POTASSIUM: 4.3 mmol/L (ref 3.5–5.2)
SODIUM: 135 mmol/L (ref 134–144)
TOTAL PROTEIN: 7.5 g/dL (ref 6.0–8.5)

## 2017-07-05 LAB — CYTOLOGY - PAP: DIAGNOSIS: NEGATIVE

## 2017-07-05 LAB — CERVICOVAGINAL ANCILLARY ONLY
Bacterial vaginitis: POSITIVE — AB
CANDIDA VAGINITIS: NEGATIVE
CHLAMYDIA, DNA PROBE: NEGATIVE
Neisseria Gonorrhea: NEGATIVE
TRICH (WINDOWPATH): NEGATIVE

## 2017-07-05 LAB — HIV ANTIBODY (ROUTINE TESTING W REFLEX): HIV SCREEN 4TH GENERATION: NONREACTIVE

## 2017-07-05 LAB — HEPATITIS B SURFACE ANTIGEN: Hepatitis B Surface Ag: NEGATIVE

## 2017-07-05 LAB — TSH: TSH: 1.09 u[IU]/mL (ref 0.450–4.500)

## 2017-07-05 LAB — HEPATITIS C ANTIBODY: Hep C Virus Ab: <0.1 s/co ratio (ref 0.0–0.9)

## 2017-07-05 LAB — RPR: RPR: NONREACTIVE

## 2017-07-05 NOTE — Telephone Encounter (Signed)
[facility] requires PA-I do not see the patient has failed treatment in the past. Please advise.

## 2017-07-05 NOTE — Telephone Encounter (Signed)
Hey Misty; She failed Flagyl last year.  Thank you.  Demichael Traum

## 2017-07-06 ENCOUNTER — Other Ambulatory Visit: Payer: Self-pay | Admitting: Certified Nurse Midwife

## 2017-07-06 DIAGNOSIS — B9689 Other specified bacterial agents as the cause of diseases classified elsewhere: Secondary | ICD-10-CM

## 2017-07-06 DIAGNOSIS — N760 Acute vaginitis: Principal | ICD-10-CM

## 2017-07-06 MED ORDER — SECNIDAZOLE 2 G PO PACK: 1.0000 | PACK | Freq: Once | ORAL | 0 refills | Status: AC

## 2017-07-06 NOTE — Telephone Encounter (Signed)
Pharmacy aware

## 2017-07-06 NOTE — Telephone Encounter (Signed)
PA approved [id number]

## 2017-12-13 ENCOUNTER — Ambulatory Visit: Payer: Medicaid Other | Admitting: Certified Nurse Midwife

## 2017-12-13 ENCOUNTER — Encounter: Payer: Self-pay | Admitting: Certified Nurse Midwife

## 2017-12-13 VITALS — BP 133/93 | HR 78 | Ht 64.0 in | Wt 172.2 lb

## 2017-12-13 DIAGNOSIS — R102 Pelvic and perineal pain: Secondary | ICD-10-CM

## 2017-12-13 NOTE — Progress Notes (Signed)
Patient ID: Barbara Ortiz, female   DOB: 04-Aug-1990, 27 y.o.   MRN: [id number]  Chief Complaint  Patient presents with  . Abdominal Pain    HPI Barbara Ortiz is a 27 y.o. female.  Reports right sided pain for several months, quick sharp pains that come and go randomly.  Reports regular soft bowel movements every other day.  Is not currently sexually active.  Denies urinary symptoms.  Hx of normal pelvic US 01/2016.  Had annual exam December 2018.  Reports normal periods.  Hx of C-section.  Discussed exercise.    HPI  Past Medical History:  Diagnosis Date  . 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 Bronchitis     Past Surgical History:  Procedure Laterality Date  . CESAREAN SECTION      Family History  Problem Relation Age of Onset  . Hypertension Mother   . Diabetes Maternal Grandmother   . Hypertension Maternal Grandmother   . Diabetes Maternal Grandfather   . Hypertension Maternal Grandfather   . Cancer Paternal Grandmother     Social History Social History   Tobacco Use  . Smoking status: Never Smoker  . Smokeless tobacco: Never Used  Substance Use Topics  . Alcohol use: Yes    Alcohol/week: 0.0 oz  . Drug use: No    No Known Allergies  Current Outpatient Medications  Medication Sig Dispense Refill  . amoxicillin-clavulanate (AUGMENTIN) 875-125 MG tablet Take 1 tablet by mouth 2 (two) times daily. (Patient not taking: Reported on 07/04/2017) 28 tablet 0  . ibuprofen (ADVIL,MOTRIN) 800 MG tablet Take 1 tablet (800 mg total) by mouth every 8 (eight) hours as needed. (Patient not taking: Reported on 07/04/2017) 30 tablet 5  . ondansetron (ZOFRAN-ODT) 4 MG disintegrating tablet Take 1 tablet (4 mg total) by mouth every 8 (eight) hours as needed for nausea or vomiting. (Patient not taking: Reported on 07/04/2017) 8 tablet 0   No current facility-administered medications for this visit.      Review of Systems Review of Systems Constitutional: negative for fatigue and weight loss Respiratory: negative for cough and wheezing Cardiovascular: negative for chest pain, fatigue and palpitations Gastrointestinal: negative for abdominal pain and change in bowel habits Genitourinary:negative Integument/breast: negative for nipple discharge Musculoskeletal:negative for myalgias Neurological: negative for gait problems and tremors Behavioral/Psych: negative for abusive relationship, depression Endocrine: negative for temperature intolerance      Blood pressure (!) 133/93, pulse 78, height  (1.626 m), weight 172 lb 3.2 oz (78.1 kg), last menstrual period 11/29/2017.  Physical Exam Physical Exam General:   alert  Skin:   no rash or abnormalities  Lungs:   clear to auscultation bilaterally  Heart:   regular rate and rhythm, S1, S2 normal, no murmur, click, rub or gallop  Breasts:   deferred  Abdomen:  normal findings: no organomegaly, soft, non-tender and no hernia  Pelvis:  External genitalia: normal general appearance Urinary system: urethral meatus normal and bladder without fullness, nontender Vaginal: normal without tenderness, induration or masses, no abnormal discharge noted Cervix: no CMT Adnexa: normal bimanual exam, tender on right side Uterus: anteverted and non-tender, normal size    50% of 15 min visit spent on counseling and coordination of care.   Data Reviewed Previous medical hx, meds, labs, previous TVUS  Assessment     1. Pelvic pain in female     Probable round ligament pain due to multigravity -  US PELVIC COMPLETE WITH TRANSVAGINAL; Future     Plan    Orders Placed This Encounter  Procedures  . US PELVIC COMPLETE WITH TRANSVAGINAL    Standing Status:   Future    Standing Expiration Date:   02/13/2019    Order Specific Question:   Reason for Exam (SYMPTOM  OR DIAGNOSIS REQUIRED)    Answer:   AUB    Order Specific Question:   Preferred  imaging location?    Answer:   [facility]   No orders of the defined types were placed in this encounter.     Follow up as needed.

## 2017-12-13 NOTE — Progress Notes (Signed)
Pt is reporting she was sent here from her PCP. Pt has c/o sharp pain in R lower abdomen for about a year. Pt states she thought it was menstrual cycle cramps but it is getting worse and not going away.

## 2017-12-20 ENCOUNTER — Ambulatory Visit (HOSPITAL_COMMUNITY)
Admission: RE | Admit: 2017-12-20 | Discharge: 2017-12-20 | Disposition: A | Discharge status: Home / Self Care | Payer: Medicaid Other | Source: Ambulatory Visit | Attending: Certified Nurse Midwife | Admitting: Certified Nurse Midwife

## 2017-12-20 DIAGNOSIS — N939 Abnormal uterine and vaginal bleeding, unspecified: Secondary | ICD-10-CM | POA: Diagnosis not present

## 2017-12-20 DIAGNOSIS — R102 Pelvic and perineal pain: Secondary | ICD-10-CM | POA: Diagnosis present

## 2018-06-11 ENCOUNTER — Other Ambulatory Visit: Payer: Self-pay

## 2018-06-11 DIAGNOSIS — N760 Acute vaginitis: Principal | ICD-10-CM

## 2018-06-11 DIAGNOSIS — B9689 Other specified bacterial agents as the cause of diseases classified elsewhere: Secondary | ICD-10-CM

## 2018-06-11 MED ORDER — METRONIDAZOLE 500 MG PO TABS: 500.0000 mg | ORAL_TABLET | Freq: Two times a day (BID) | ORAL | 0 refills | Status: DC

## 2018-06-11 NOTE — Progress Notes (Signed)
Pt states she is having symptoms of BV. Sent Flagyl per protocol. Advised pt she is due for an annual next month, transferred call up front to make an appt.

## 2018-07-05 ENCOUNTER — Ambulatory Visit: Payer: Medicaid Other | Admitting: Obstetrics

## 2018-07-05 ENCOUNTER — Other Ambulatory Visit (HOSPITAL_COMMUNITY)
Admission: RE | Admit: 2018-07-05 | Discharge: 2018-07-05 | Disposition: A | Discharge status: Home / Self Care | Payer: Medicaid Other | Source: Ambulatory Visit | Attending: Obstetrics | Admitting: Obstetrics

## 2018-07-05 ENCOUNTER — Other Ambulatory Visit: Payer: Self-pay

## 2018-07-05 ENCOUNTER — Encounter: Payer: Self-pay | Admitting: Obstetrics

## 2018-07-05 VITALS — BP 133/78 | HR 79 | Ht 64.0 in | Wt 181.7 lb

## 2018-07-05 DIAGNOSIS — Z01419 Encounter for gynecological examination (general) (routine) without abnormal findings: Secondary | ICD-10-CM

## 2018-07-05 DIAGNOSIS — Z0000 Encounter for general adult medical examination without abnormal findings: Secondary | ICD-10-CM | POA: Diagnosis not present

## 2018-07-05 DIAGNOSIS — N898 Other specified noninflammatory disorders of vagina: Secondary | ICD-10-CM | POA: Diagnosis present

## 2018-07-05 DIAGNOSIS — Z3009 Encounter for other general counseling and advice on contraception: Secondary | ICD-10-CM

## 2018-07-05 NOTE — Progress Notes (Signed)
Presents for AEX/PAP/STD testing.

## 2018-07-05 NOTE — Progress Notes (Signed)
Subjective:        Barbara Ortiz is a 27 y.o. female here for a routine exam.  Current complaints: None.    Personal health questionnaire:  Is patient Ashkenazi Jewish, have a family history of breast and/or ovarian cancer: no Is there a family history of uterine cancer diagnosed at age < 6650, gastrointestinal cancer, urinary tract cancer, family member who is a Personnel officerLynch syndrome-associated carrier: no Is the patient overweight and hypertensive, family history of diabetes, personal history of gestational diabetes, preeclampsia or PCOS: no Is patient over 2655, have PCOS,  family history of premature CHD under age 28, diabetes, smoke, have hypertension or peripheral artery disease:  no At any time, has a partner hit, kicked or otherwise hurt or frightened you?: no Over the past 2 weeks, have you felt down, depressed or hopeless?: no Over the past 2 weeks, have you felt little interest or pleasure in doing things?:no   Gynecologic History Patient's last menstrual period was 06/22/2018 (exact date). Contraception: none Last Pap: 2018. Results were: normal Last mammogram: n/a. Results were: n/a  Obstetric History OB History  Gravida Para Term Preterm AB Living  7 4 4  0 2 4  SAB TAB Ectopic Multiple Live Births  2 0 0 0 3    # Outcome Date GA Lbr Len/2nd Weight Sex Delivery Anes PTL Lv  7 Term 08/08/11    F Vag-Spont EPI  LIV  6 Term 08/03/10    F Vag-Spont EPI  LIV  5 Term 03/12/08    F Vag-Spont EPI  LIV  4 Gravida           3 SAB           2 Term           1 SAB             Past Medical History:  Diagnosis Date  . 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 Bronchitis     Past Surgical History:  Procedure Laterality Date  . CESAREAN SECTION       Current Outpatient Medications:  .  amoxicillin-clavulanate (AUGMENTIN) 875-125 MG tablet, Take 1 tablet by mouth 2 (two) times daily. (Patient not  taking: Reported on 07/04/2017), Disp: 28 tablet, Rfl: 0 .  ibuprofen (ADVIL,MOTRIN) 800 MG tablet, Take 1 tablet (800 mg total) by mouth every 8 (eight) hours as needed. (Patient not taking: Reported on 07/04/2017), Disp: 30 tablet, Rfl: 5 .  metroNIDAZOLE (FLAGYL) 500 MG tablet, Take 1 tablet (500 mg total) by mouth 2 (two) times daily. (Patient not taking: Reported on 07/05/2018), Disp: 14 tablet, Rfl: 0 .  ondansetron (ZOFRAN-ODT) 4 MG disintegrating tablet, Take 1 tablet (4 mg total) by mouth every 8 (eight) hours as needed for nausea or vomiting. (Patient not taking: Reported on 07/04/2017), Disp: 8 tablet, Rfl: 0 No Known Allergies  Social History   Tobacco Use  . Smoking status: Never Smoker  . Smokeless tobacco: Never Used  Substance Use Topics  . Alcohol use: Yes    Alcohol/week: 0.0 standard drinks    Family History  Problem Relation Age of Onset  . Hypertension Mother   . Diabetes Maternal Grandmother   . Hypertension Maternal Grandmother   . Diabetes Maternal Grandfather   . Hypertension Maternal Grandfather   . Cancer Paternal Grandmother       Review of Systems  Constitutional: negative for fatigue and weight  loss Respiratory: negative for cough and wheezing Cardiovascular: negative for chest pain, fatigue and palpitations Gastrointestinal: negative for abdominal pain and change in bowel habits Musculoskeletal:negative for myalgias Neurological: negative for gait problems and tremors Behavioral/Psych: negative for abusive relationship, depression Endocrine: negative for temperature intolerance    Genitourinary:negative for abnormal menstrual periods, genital lesions, hot flashes, sexual problems and vaginal discharge Integument/breast: negative for breast lump, breast tenderness, nipple discharge and skin lesion(s)    Objective:       BP 133/78   Pulse 79   Ht 5\' 4"  (1.626 m)   Wt 181 lb 11.2 oz (82.4 kg)   LMP 06/22/2018 (Exact Date)   BMI 31.19 kg/m   General:   alert  Skin:   no rash or abnormalities  Lungs:   clear to auscultation bilaterally  Heart:   regular rate and rhythm, S1, S2 normal, no murmur, click, rub or gallop  Breasts:   normal without suspicious masses, skin or nipple changes or axillary nodes  Abdomen:  normal findings: no organomegaly, soft, non-tender and no hernia  Pelvis:  External genitalia: normal general appearance Urinary system: urethral meatus normal and bladder without fullness, nontender Vaginal: normal without tenderness, induration or masses Cervix: normal appearance Adnexa: normal bimanual exam Uterus: anteverted and non-tender, normal size   Lab Review Urine pregnancy test Labs reviewed yes Radiologic studies reviewed no  50% of 20 min visit spent on counseling and coordination of care.   Assessment:     1. Encounter for routine gynecological examination with Papanicolaou smear of cervix Rx: - Cytology - PAP( [place])  2. Vaginal discharge Rx: - Cervicovaginal ancillary only( [place])  3. Encounter for other general counseling and advice on contraception - declines   Plan:    Education reviewed: calcium supplements, depression evaluation, low fat, low cholesterol diet, safe sex/STD prevention, self breast exams and weight bearing exercise. Contraception: none. Follow up in: 1 year.   No orders of the defined types were placed in this encounter.  No orders of the defined types were placed in this encounter.   Brock BadHARLES A.  MD 07-05-2018

## 2018-07-08 LAB — CERVICOVAGINAL ANCILLARY ONLY
Bacterial vaginitis: NEGATIVE
CANDIDA VAGINITIS: POSITIVE — AB
CHLAMYDIA, DNA PROBE: NEGATIVE
Neisseria Gonorrhea: NEGATIVE
Trichomonas: NEGATIVE

## 2018-07-09 ENCOUNTER — Other Ambulatory Visit: Payer: Self-pay | Admitting: Obstetrics

## 2018-07-09 DIAGNOSIS — B373 Candidiasis of vulva and vagina: Secondary | ICD-10-CM

## 2018-07-09 DIAGNOSIS — B3731 Acute candidiasis of vulva and vagina: Secondary | ICD-10-CM

## 2018-07-09 MED ORDER — FLUCONAZOLE 150 MG PO TABS: 150.0000 mg | ORAL_TABLET | Freq: Once | ORAL | 0 refills | Status: AC

## 2018-07-12 LAB — CYTOLOGY - PAP
Diagnosis: UNDETERMINED — AB
HPV (WINDOPATH): NOT DETECTED

## 2019-03-07 IMAGING — US US PELVIS COMPLETE TRANSABD/TRANSVAG
1 series · 15 of 25 positions shown · non-contrast
Comparison: None

CLINICAL DATA: Patient with pelvic pain. Abnormal uterine bleeding.



[Series 1: us pelvis complete transabd/transvag · 15 of 80 slices shown]
[im 1/80]
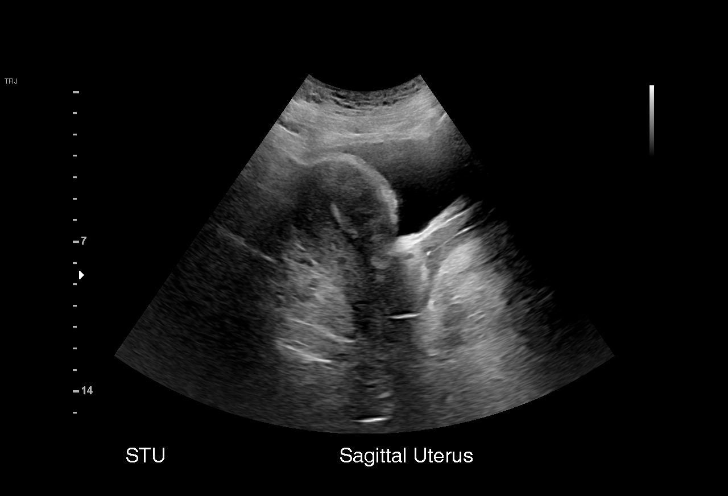
[im 7/80]
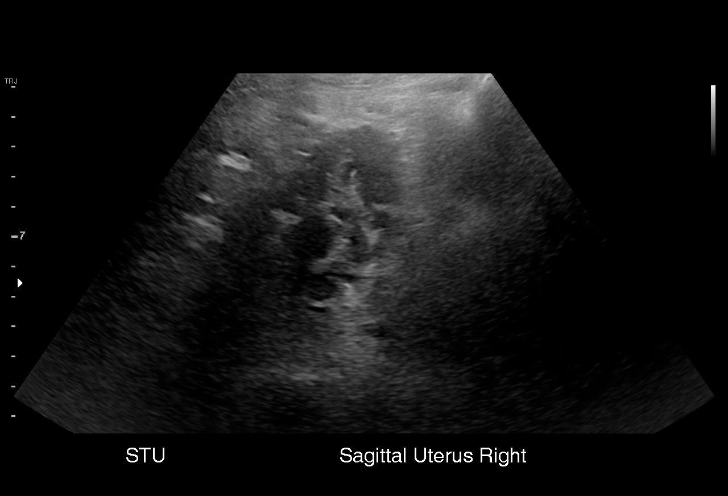
[im 14/80]
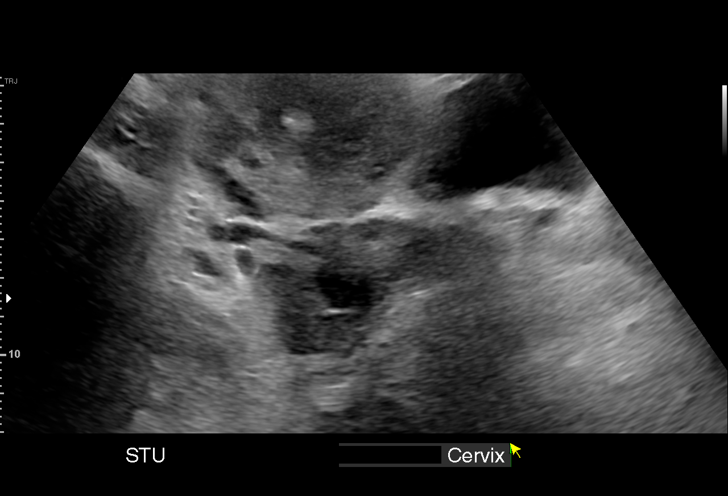
[im 17/80]
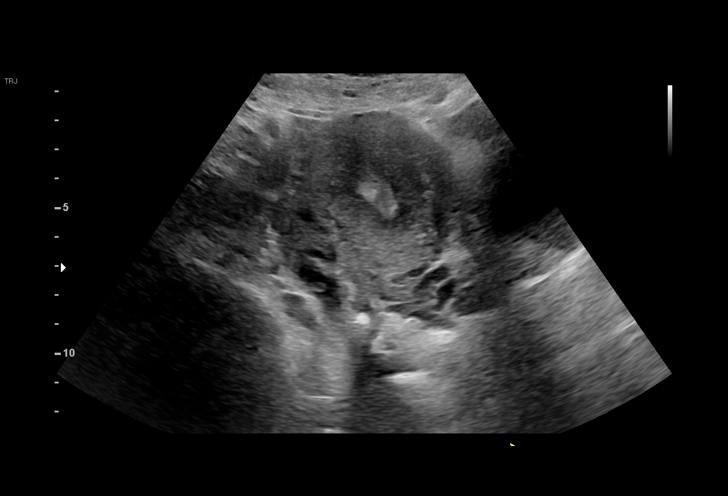
[im 24/80]
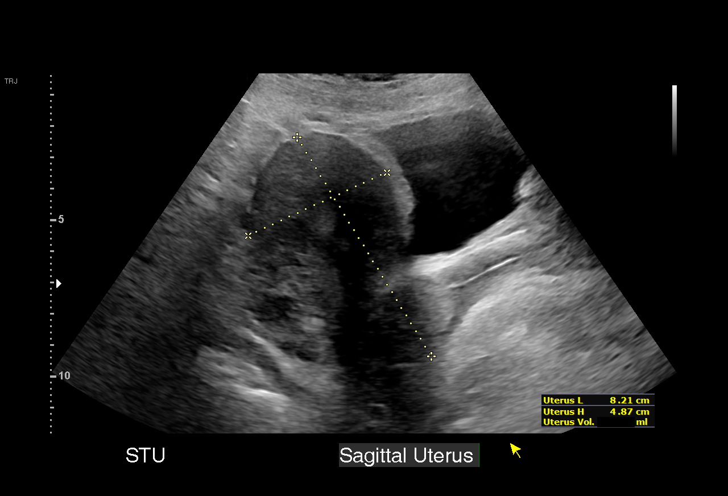
[im 30/80]
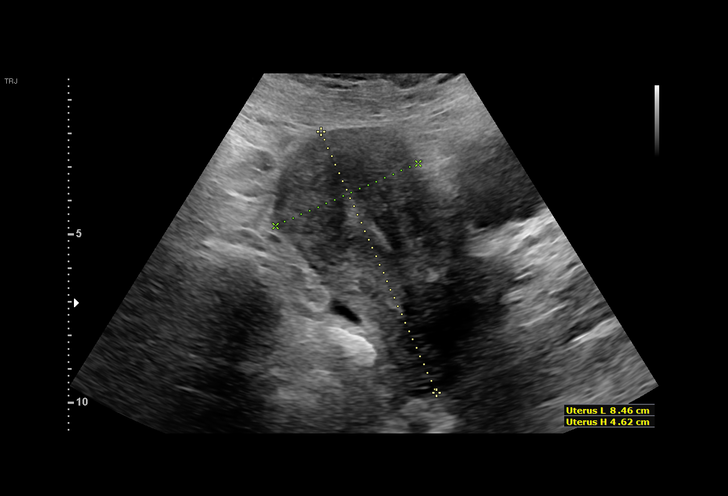
[im 33/80]
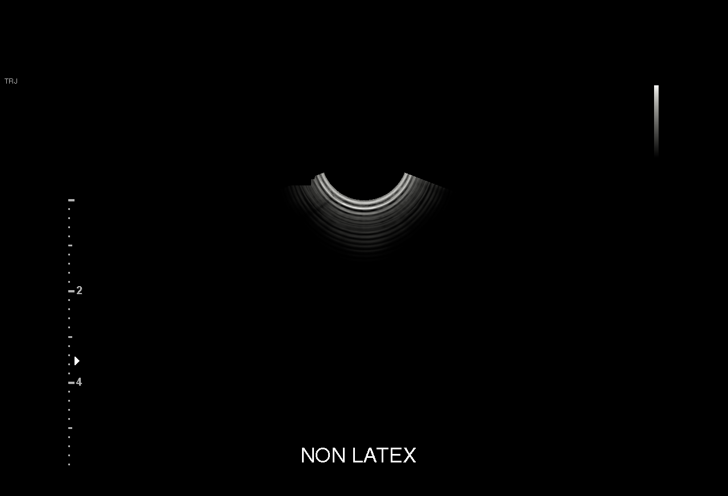
[im 40/80]
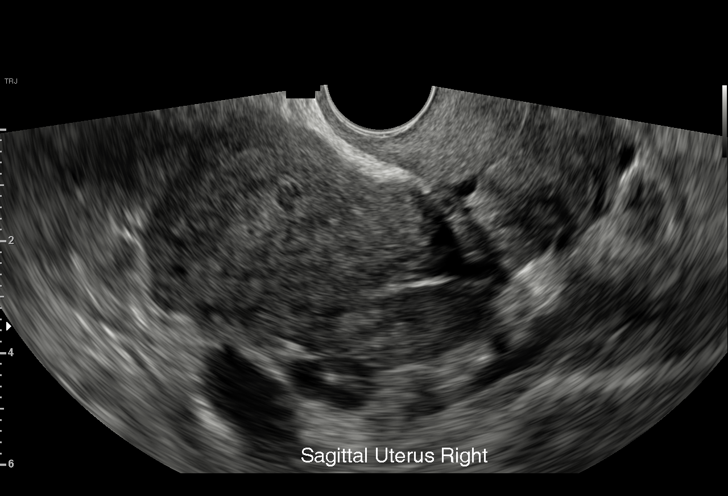
[im 47/80]
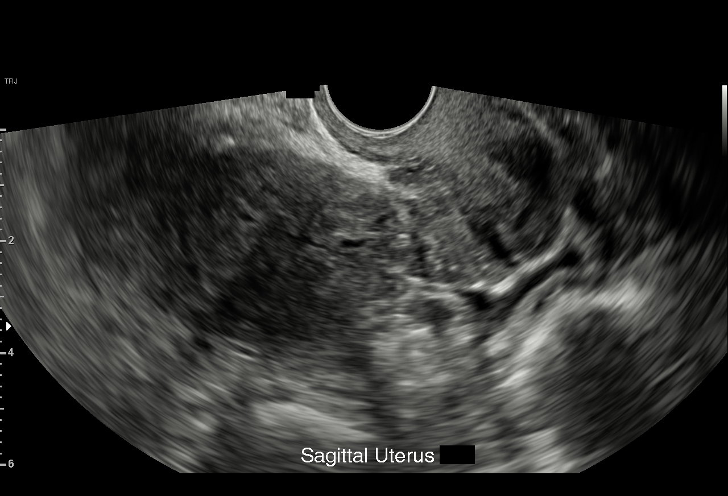
[im 50/80]
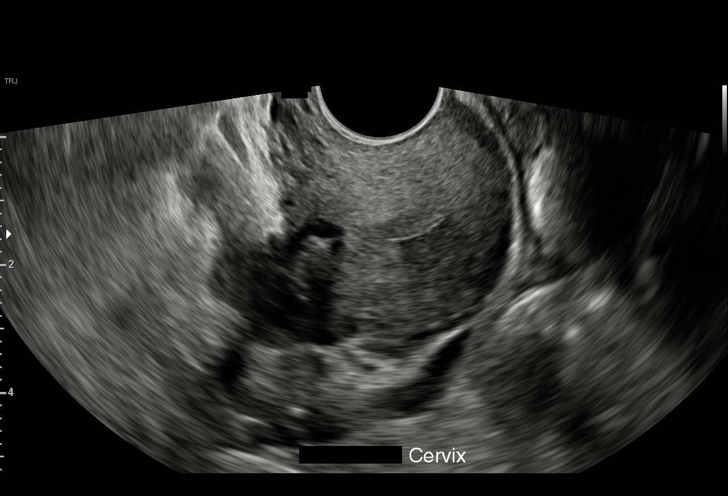
[im 56/80]
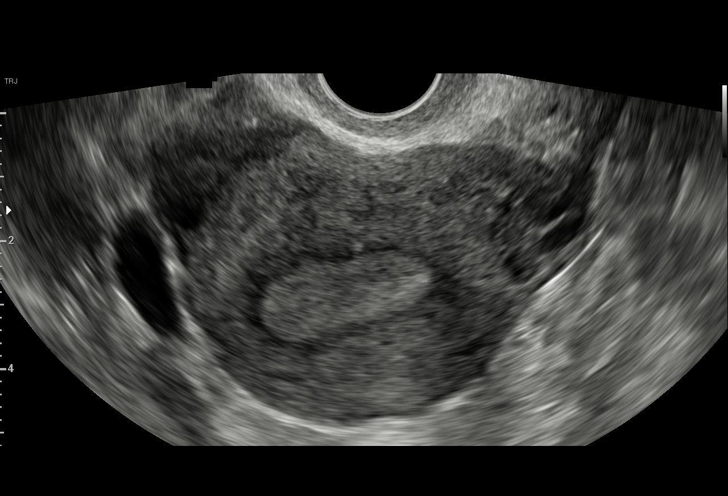
[im 63/80]
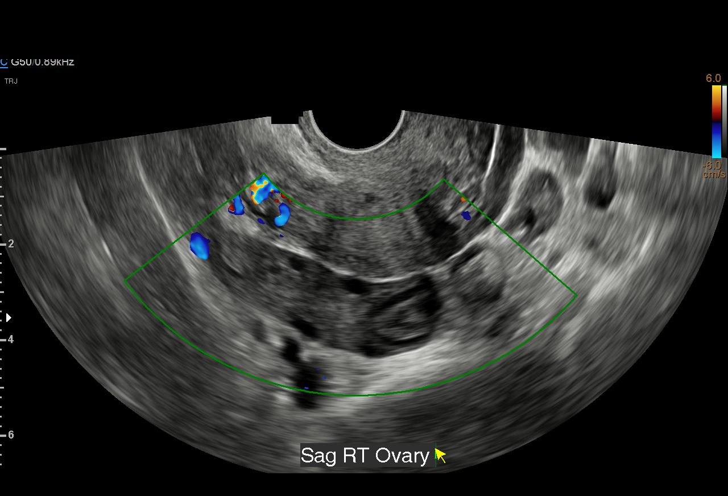
[im 66/80]
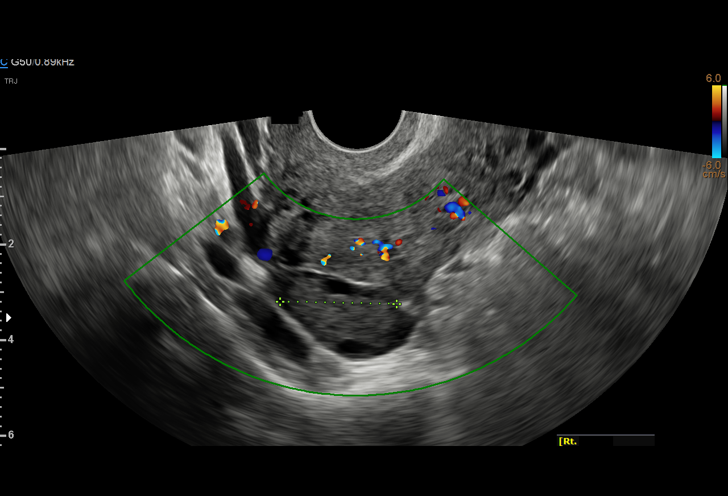
[im 73/80]
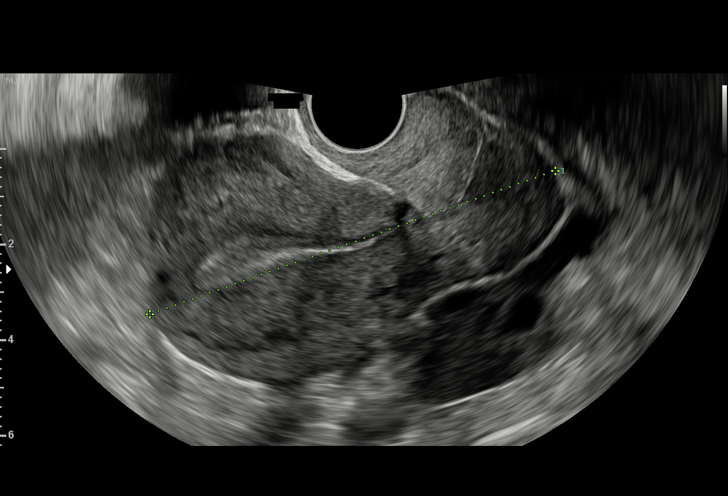
[im 80/80]
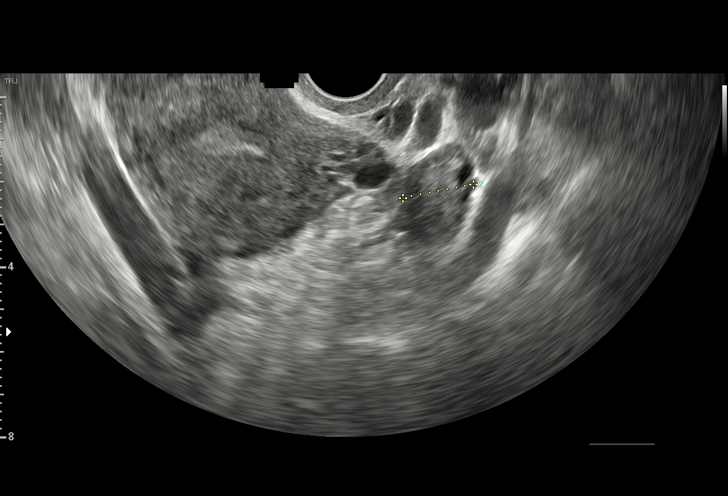

[15 of 25 positions shown; findings below may reference images not displayed]

FINDINGS: Uterus

Measurements: 8.7 x 5.0 x 5.2 cm. No fibroids or other mass
visualized.

Endometrium

Thickness: 9 mm.  No focal abnormality visualized.

Right ovary

Measurements: 4.4 x 1.5 x 2.4 cm. Normal appearance/no adnexal mass.

Left ovary

Measurements: 2.8 x 1.6 x 2.4 cm. Normal appearance/no adnexal mass.

Other findings

Small amount of free fluid in the pelvis.
IMPRESSION: No acute process within the pelvis.

Endometrium measures 9 mm. If bleeding remains unresponsive to
hormonal or medical therapy, sonohysterogram should be considered
for focal lesion work-up. (Ref: Radiological Reasoning: Algorithmic
Workup of Abnormal Vaginal Bleeding with Endovaginal Sonography and
Sonohysterography. AJR [id number]; 191:S68-73)

## 2019-09-11 ENCOUNTER — Other Ambulatory Visit: Payer: Self-pay

## 2019-09-11 ENCOUNTER — Ambulatory Visit (INDEPENDENT_AMBULATORY_CARE_PROVIDER_SITE_OTHER): Payer: Medicaid Other

## 2019-09-11 VITALS — BP 133/84 | HR 77 | Ht 64.0 in | Wt 189.2 lb

## 2019-09-11 DIAGNOSIS — Z3201 Encounter for pregnancy test, result positive: Secondary | ICD-10-CM

## 2019-09-11 DIAGNOSIS — Z3480 Encounter for supervision of other normal pregnancy, unspecified trimester: Secondary | ICD-10-CM | POA: Insufficient documentation

## 2019-09-11 LAB — POCT URINE PREGNANCY: Preg Test, Ur: POSITIVE — AB

## 2019-09-11 MED ORDER — BLOOD PRESSURE KIT DEVI: 1.0000 | 0 refills | Status: DC

## 2019-09-11 NOTE — Progress Notes (Signed)
Barbara Ortiz presents today for UPT. She has no unusual complaints.  LMP:08/14/2019  EDD 05/20/2020  [redacted]w[redacted]d    OBJECTIVE: Appears well, in no apparent distress.  OB History    Gravida  8   Para  4   Term  4   Preterm  0   AB  2   Living  4     SAB  2   TAB  0   Ectopic  0   Multiple  0   Live Births  3          Home UPT Result:POSITIVE In-Office UPT result: POSITIVE  I have reviewed the patient's medical, obstetrical, social, and family histories, and medications.   ASSESSMENT: Positive pregnancy test  PLAN Prenatal care to be completed at: [facility] --------------------------------------------------------  PRENATAL INTAKE SUMMARY  Barbara Ortiz presents today New OB Nurse Interview.  OB History    Gravida  8   Para  4   Term  4   Preterm  0   AB  2   Living  4     SAB  2   TAB  0   Ectopic  0   Multiple  0   Live Births  3          I have reviewed the patient's medical, obstetrical, social, and family histories, medications, and available lab results.  SUBJECTIVE She has no unusual complaints  OBJECTIVE Initial Physical Exam (New OB)  GENERAL APPEARANCE: alert, well appearing   ASSESSMENT Normal pregnancy LMP: 08/14/2019 EDD: 05/20/2020 [redacted]w[redacted]d  PLAN Prenatal care @ [facility]

## 2019-09-12 NOTE — Progress Notes (Signed)
Patient seen and assessed by nursing staff during this encounter. I have reviewed the chart and agree with the documentation and plan.  Sharyon Cable, CNM 09/12/2019 3:44 PM

## 2019-09-29 ENCOUNTER — Telehealth: Payer: Self-pay

## 2019-09-29 NOTE — Telephone Encounter (Signed)
Pt called office on Friday 09/26/19 after office closing, After hours nurse took the call and pt reports her feet were swelling and she was having mild SOB. Called pt back to evaluate, no answer and no option to leave a VM.

## 2019-10-04 ENCOUNTER — Inpatient Hospital Stay (HOSPITAL_COMMUNITY): Payer: Medicaid Other

## 2019-10-04 ENCOUNTER — Inpatient Hospital Stay (HOSPITAL_COMMUNITY)
Admission: AD | Admit: 2019-10-04 | Discharge: 2019-10-04 | Disposition: A | Discharge status: Home / Self Care | Payer: Medicaid Other | Attending: Obstetrics & Gynecology | Admitting: Obstetrics & Gynecology

## 2019-10-04 ENCOUNTER — Other Ambulatory Visit: Payer: Self-pay

## 2019-10-04 ENCOUNTER — Encounter (HOSPITAL_COMMUNITY): Payer: Self-pay | Admitting: Emergency Medicine

## 2019-10-04 DIAGNOSIS — R109 Unspecified abdominal pain: Secondary | ICD-10-CM | POA: Diagnosis present

## 2019-10-04 DIAGNOSIS — O26891 Other specified pregnancy related conditions, first trimester: Secondary | ICD-10-CM | POA: Diagnosis present

## 2019-10-04 DIAGNOSIS — O09623 Supervision of young multigravida, third trimester: Secondary | ICD-10-CM

## 2019-10-04 DIAGNOSIS — Z3490 Encounter for supervision of normal pregnancy, unspecified, unspecified trimester: Secondary | ICD-10-CM

## 2019-10-04 DIAGNOSIS — Z3A01 Less than 8 weeks gestation of pregnancy: Secondary | ICD-10-CM | POA: Diagnosis not present

## 2019-10-04 DIAGNOSIS — K5900 Constipation, unspecified: Secondary | ICD-10-CM | POA: Diagnosis present

## 2019-10-04 DIAGNOSIS — O26899 Other specified pregnancy related conditions, unspecified trimester: Secondary | ICD-10-CM

## 2019-10-04 LAB — WET PREP, GENITAL
Sperm: NONE SEEN
Trich, Wet Prep: NONE SEEN
Yeast Wet Prep HPF POC: NONE SEEN

## 2019-10-04 LAB — URINALYSIS, ROUTINE W REFLEX MICROSCOPIC
Bilirubin Urine: NEGATIVE
Glucose, UA: NEGATIVE mg/dL
Ketones, ur: NEGATIVE mg/dL
Leukocytes,Ua: NEGATIVE
Nitrite: NEGATIVE
Protein, ur: NEGATIVE mg/dL
Specific Gravity, Urine: 1.025 (ref 1.005–1.030)
pH: 5 (ref 5.0–8.0)

## 2019-10-04 LAB — CBC
HCT: 38 % (ref 36.0–46.0)
Hemoglobin: 12.2 g/dL (ref 12.0–15.0)
MCH: 27.1 pg (ref 26.0–34.0)
MCHC: 32.1 g/dL (ref 30.0–36.0)
MCV: 84.3 fL (ref 80.0–100.0)
Platelets: 314 10*3/uL (ref 150–400)
RBC: 4.51 MIL/uL (ref 3.87–5.11)
RDW: 14.1 % (ref 11.5–15.5)
WBC: 7.1 10*3/uL (ref 4.0–10.5)
nRBC: 0 % (ref 0.0–0.2)

## 2019-10-04 LAB — HCG, QUANTITATIVE, PREGNANCY: hCG, Beta Chain, Quant, S: 172462 m[IU]/mL — ABNORMAL HIGH (ref <5)

## 2019-10-04 LAB — ABO/RH: ABO/RH(D): O POS

## 2019-10-04 LAB — HIV ANTIBODY (ROUTINE TESTING W REFLEX): HIV Screen 4th Generation wRfx: NONREACTIVE

## 2019-10-04 MED ORDER — FLEET ENEMA 7-19 GM/118ML RE ENEM
1.0000 | ENEMA | Freq: Once | RECTAL | Status: AC
  Administered 2019-10-04: 1 via RECTAL

## 2019-10-04 MED ORDER — POLYETHYLENE GLYCOL 3350 17 G PO PACK: 17.0000 g | PACK | Freq: Every day | ORAL | 1 refills | Status: DC

## 2019-10-04 NOTE — MAU Provider Note (Signed)
History     CSN: [id number]  Arrival date and time: 10/04/19 1512   First Provider Initiated Contact with Patient 10/04/19 1718      Chief Complaint  Patient presents with  . Abdominal Pain  . Leg Pain   HPI  Ms.  Barbara Ortiz is a 29 y.o. year old [phone] female at 1w2dweeks gestation who presents to MAU reporting abdominal pain on both sides of her abdomen. She describes the pain as "stabbing and goes down to legs." She reports the pain started yesterday while she was lying in bed. She had some spotting early this AM, but none now. Last SI was about 2 wks ago. She also has a complaint of constipation; last BM was "last weekend." [facility].   Past Medical History:  Diagnosis Date  . Anemia   . Anxiety   . Asthma   . 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 Bronchitis     Past Surgical History:  Procedure Laterality Date  . CESAREAN SECTION    . DILATION AND CURETTAGE OF UTERUS      Family History  Problem Relation Age of Onset  . Hypertension Mother   . Diabetes Maternal Grandmother   . Hypertension Maternal Grandmother   . Diabetes Maternal Grandfather   . Hypertension Maternal Grandfather   . Cancer Paternal Grandmother     Social History   Tobacco Use  . Smoking status: Never Smoker  . Smokeless tobacco: Never Used  Substance Use Topics  . Alcohol use: Not Currently    Alcohol/week: 0.0 standard drinks  . Drug use: No    Allergies: No Known Allergies  Medications Prior to Admission  Medication Sig Dispense Refill Last Dose  . amoxicillin-clavulanate (AUGMENTIN) 875-125 MG tablet Take 1 tablet by mouth 2 (two) times daily. (Patient not taking: Reported on 07/04/2017) 28 tablet 0   . Blood Pressure Monitoring (BLOOD PRESSURE KIT) DEVI 1 kit by Does not apply route once a week. Check Blood Pressure regularly and record readings  into the Babyscripts App.  Large Cuff.  DX O90.0 1 each 0   . ibuprofen (ADVIL,MOTRIN) 800 MG tablet Take 1 tablet (800 mg total) by mouth every 8 (eight) hours as needed. (Patient not taking: Reported on 07/04/2017) 30 tablet 5   . metroNIDAZOLE (FLAGYL) 500 MG tablet Take 1 tablet (500 mg total) by mouth 2 (two) times daily. (Patient not taking: Reported on 07/05/2018) 14 tablet 0   . ondansetron (ZOFRAN-ODT) 4 MG disintegrating tablet Take 1 tablet (4 mg total) by mouth every 8 (eight) hours as needed for nausea or vomiting. (Patient not taking: Reported on 07/04/2017) 8 tablet 0     Review of Systems  Constitutional: Negative.   HENT: Negative.   Eyes: Negative.   Respiratory: Negative.   Cardiovascular: Negative.   Gastrointestinal: Positive for abdominal pain and constipation ("last BM was last weekend").  Endocrine: Negative.   Genitourinary: Positive for pelvic pain.  Musculoskeletal: Negative.   Skin: Negative.   Allergic/Immunologic: Negative.   Neurological: Negative.   Hematological: Negative.   Psychiatric/Behavioral: Negative.    Physical Exam   Blood pressure 135/77, pulse 74, temperature 98.4 F (36.9 C), temperature source Oral, resp. rate 15, height _0  (1.626 m), weight 88.9 kg, last menstrual period 08/14/2019, SpO2 100 %.  Physical Exam  Nursing note and vitals reviewed. Constitutional: She is  oriented to person, place, and time. She appears well-developed and well-nourished.  HENT:  Head: Normocephalic and atraumatic.  Eyes: Pupils are equal, round, and reactive to light.  Cardiovascular: Normal rate.  Respiratory: Effort normal.  GI: Soft.  Genitourinary:    Genitourinary Comments: Uterus: non-tender, SE: cervix is smooth, pink, no lesions, small amt of thin, white vaginal d/c -- WP, GC/CT done, closed/long/firm, no CMT or friability, no adnexal tenderness    Musculoskeletal:        General: Normal range of motion.     Cervical back: Normal range of  motion.  Neurological: She is alert and oriented to person, place, and time.  Skin: Skin is warm and dry.  Psychiatric: She has a normal mood and affect. Her behavior is normal. Judgment and thought content normal.    MAU Course  Procedures  MDM CCUA UPT CBC ABO/Rh HCG Wet Prep GC/CT -- pending HIV -- pending OB < 14 wks [place] with TV  Results for orders placed or performed during the hospital encounter of 10/04/19 (from the past 24 hour(s))  ABO/Rh     Status: None   Collection Time: 10/04/19  5:26 PM  Result Value Ref Range   ABO/RH(D)      O POS Performed at [facility] [address], [place], [place] [address]   Wet prep, genital     Status: Abnormal   Collection Time: 10/04/19  5:29 PM  Result Value Ref Range   Yeast Wet Prep HPF POC NONE SEEN NONE SEEN   Trich, Wet Prep NONE SEEN NONE SEEN   Clue Cells Wet Prep HPF POC PRESENT (A) NONE SEEN   WBC, Wet Prep HPF POC MANY (A) NONE SEEN   Sperm NONE SEEN   HIV Antibody (routine testing w rflx)     Status: None   Collection Time: 10/04/19  5:43 PM  Result Value Ref Range   HIV Screen 4th Generation wRfx NON REACTIVE NON REACTIVE  CBC     Status: None   Collection Time: 10/04/19  5:43 PM  Result Value Ref Range   WBC 7.1 4.0 - 10.5 K/uL   RBC 4.51 3.87 - 5.11 MIL/uL   Hemoglobin 12.2 12.0 - 15.0 g/dL   HCT 38.0 36.0 - 46.0 %   MCV 84.3 80.0 - 100.0 fL   MCH 27.1 26.0 - 34.0 pg   MCHC 32.1 30.0 - 36.0 g/dL   RDW 14.1 11.5 - 15.5 %   Platelets 314 150 - 400 K/uL   nRBC 0.0 0.0 - 0.2 %  hCG, quantitative, pregnancy     Status: Abnormal   Collection Time: 10/04/19  5:43 PM  Result Value Ref Range   hCG, Beta Chain, Quant, S 172,462 (H) <5 mIU/mL  Urinalysis, Routine w reflex microscopic     Status: Abnormal   Collection Time: 10/04/19  6:54 PM  Result Value Ref Range   Color, Urine YELLOW YELLOW   APPearance HAZY (A) CLEAR   Specific Gravity, Urine 1.025 1.005 - 1.030   pH 5.0 5.0 - 8.0   Glucose, UA  NEGATIVE NEGATIVE mg/dL   Hgb urine dipstick SMALL (A) NEGATIVE   Bilirubin Urine NEGATIVE NEGATIVE   Ketones, ur NEGATIVE NEGATIVE mg/dL   Protein, ur NEGATIVE NEGATIVE mg/dL   Nitrite NEGATIVE NEGATIVE   Leukocytes,Ua NEGATIVE NEGATIVE   RBC / HPF 6-10 0 - 5 RBC/hpf   WBC, UA 0-5 0 - 5 WBC/hpf   Bacteria, UA RARE (A) NONE SEEN  Squamous Epithelial / LPF 6-10 0 - 5   Mucus PRESENT     US OB Comp Less 14 Wks  Result Date: 10/04/2019 CLINICAL DATA:  Abdominal pain. Quantitative beta HCG is pending. By LMP of 08/14/2019, patient is 7 weeks 2 days. EDC by LMP is 05/20/2020. EXAM: OBSTETRIC <14 WK ULTRASOUND TECHNIQUE: Transabdominal ultrasound was performed for evaluation of the gestation as well as the maternal uterus and adnexal regions. COMPARISON:  Pelvic ultrasound on 12/20/2017 FINDINGS: Intrauterine gestational sac: Single Yolk sac:  Visualized. Embryo:  Visualized. Cardiac Activity: Visualized. Heart Rate: 155 bpm CRL: 12.1 mm   7 w 3 d                  [place] EDC: 05/19/2020 Subchorionic hemorrhage:  Small subchorionic hemorrhage. Maternal uterus/adnexae: LEFT corpus luteum cyst. RIGHT ovary is unremarkable. No free pelvic fluid. IMPRESSION: 1. Single living intrauterine embryo measuring 7 weeks 3 days. 2. Today's exam confirms clinical EDC of 05/20/2020. Electronically Signed   By: Nolon Nations M.D.   On: 10/04/2019 18:31     Assessment and Plan  Abdominal pain during pregnancy in first trimester - Information provided on abdominal pain in pregnancy   Constipation, unspecified constipation type  - Information provided on constipation  - Poop Regimen given  - Discharge patient - Keep scheduled appointment at [facility] - Patient verbalized an understanding of the plan of care and agrees.   Laury Deep, MSN, CNM 10/04/2019, 5:33 PM

## 2019-10-04 NOTE — MAU Note (Signed)
Barbara Ortiz is a 29 y.o. at [redacted]w[redacted]d here in MAU reporting: abdominal pain on the right and left side, pain is intermittent and changes sides, is stabbing, feels the pain down into her legs when it comes. Noticed some spotting but none since this morning. No recent IC.   Onset of complaint: ongoing  Pain score: 9/10  Vitals:   10/04/19 1525 10/04/19 1642  BP: (!) 144/74 135/77  Pulse: 76 74  Resp: 14 15  Temp: 98.1 F (36.7 C) 98.4 F (36.9 C)  SpO2: 100% 100%     Lab orders placed from triage: UA

## 2019-10-04 NOTE — ED Provider Notes (Signed)
MSE was initiated and I personally evaluated the patient and placed orders (if any) at  3:57 PM on October 04, 2019.  29 year old female G8 P4-0-2-4 [redacted]w[redacted]d presents emergency department today with chief complaint of bilateral lower extremity edema x1 week.  She admits the edema improved with elevation. She is also endorsing lower abdominal cramping and spotting.  She states when she using the bathroom last night she noticed bright red blood on the toilet tissue.  Patient also states she went to urgent care where she had a negative chest x-ray and advised to come to the emergency department for further evaluation to make sure she does not have a blood clot.  PE: Constitutional: well-developed, well-nourished, no apparent distress HENT: normocephalic, atraumatic. no cervical adenopathy Cardiovascular: normal rate and rhythm, distal pulses intact Pulmonary/Chest: effort normal; breath sounds clear and equal bilaterally; no wheezes or rales Abdominal: soft and nontender Musculoskeletal: full ROM, no edema appreciated on exam Neurological: alert with goal directed thinking Skin: warm and dry, no rash, no diaphoresis Psychiatric: normal mood and affect, normal behavior    The patient appears stable so that the remainder of the MSE may be completed by another provider in MAU. I discussed case with APP provider Samara Deist who agrees to assume care of patient.    Sherene Sires, PA-C 10/04/19 1603    Pricilla Loveless, MD 10/05/19 1525

## 2019-10-04 NOTE — ED Triage Notes (Signed)
Pt states she is 7 1/[redacted] weeks pregnant.  C/o bilateral lower extremity swelling x 1 week.  Also reports chronic constipation with lower back pain.  Lower abd cramping since yesterday.  States LLQ pain radiates down L leg.  Reports spotting when she wiped last night.

## 2019-10-04 NOTE — Discharge Instructions (Signed)

## 2019-10-06 LAB — GC/CHLAMYDIA PROBE AMP (~~LOC~~) NOT AT ARMC
Chlamydia: NEGATIVE
Comment: NEGATIVE
Comment: NORMAL
Neisseria Gonorrhea: NEGATIVE

## 2019-10-08 ENCOUNTER — Other Ambulatory Visit: Payer: Self-pay

## 2019-10-08 ENCOUNTER — Telehealth: Payer: Self-pay

## 2019-10-08 DIAGNOSIS — B9689 Other specified bacterial agents as the cause of diseases classified elsewhere: Secondary | ICD-10-CM

## 2019-10-08 DIAGNOSIS — N760 Acute vaginitis: Secondary | ICD-10-CM

## 2019-10-08 MED ORDER — METRONIDAZOLE 0.75 % VA GEL: 1.0000 | Freq: Every day | VAGINAL | 0 refills | Status: DC

## 2019-10-08 NOTE — Telephone Encounter (Signed)
Return call to pt regarding Rx request for BV Confirmed ok to send Rx  Pt made aware Rx sent for BV to pt pharmacy.

## 2019-10-27 ENCOUNTER — Emergency Department (HOSPITAL_COMMUNITY)
Admission: EM | Admit: 2019-10-27 | Discharge: 2019-10-28 | Disposition: A | Discharge status: Home / Self Care | Payer: Medicaid Other | Attending: Emergency Medicine | Admitting: Emergency Medicine

## 2019-10-27 ENCOUNTER — Other Ambulatory Visit: Payer: Self-pay

## 2019-10-27 DIAGNOSIS — Z20822 Contact with and (suspected) exposure to covid-19: Secondary | ICD-10-CM | POA: Diagnosis not present

## 2019-10-27 DIAGNOSIS — O99511 Diseases of the respiratory system complicating pregnancy, first trimester: Secondary | ICD-10-CM | POA: Diagnosis not present

## 2019-10-27 DIAGNOSIS — M7918 Myalgia, other site: Secondary | ICD-10-CM | POA: Diagnosis not present

## 2019-10-27 DIAGNOSIS — R509 Fever, unspecified: Secondary | ICD-10-CM | POA: Insufficient documentation

## 2019-10-27 DIAGNOSIS — R519 Headache, unspecified: Secondary | ICD-10-CM | POA: Insufficient documentation

## 2019-10-27 DIAGNOSIS — J45909 Unspecified asthma, uncomplicated: Secondary | ICD-10-CM | POA: Insufficient documentation

## 2019-10-27 DIAGNOSIS — Z3A11 11 weeks gestation of pregnancy: Secondary | ICD-10-CM | POA: Diagnosis not present

## 2019-10-27 DIAGNOSIS — O219 Vomiting of pregnancy, unspecified: Secondary | ICD-10-CM | POA: Insufficient documentation

## 2019-10-27 DIAGNOSIS — O99891 Other specified diseases and conditions complicating pregnancy: Secondary | ICD-10-CM | POA: Diagnosis not present

## 2019-10-27 LAB — CBC
HCT: 35.6 % — ABNORMAL LOW (ref 36.0–46.0)
Hemoglobin: 11.6 g/dL — ABNORMAL LOW (ref 12.0–15.0)
MCH: 27.3 pg (ref 26.0–34.0)
MCHC: 32.6 g/dL (ref 30.0–36.0)
MCV: 83.8 fL (ref 80.0–100.0)
Platelets: 288 10*3/uL (ref 150–400)
RBC: 4.25 MIL/uL (ref 3.87–5.11)
RDW: 14.5 % (ref 11.5–15.5)
WBC: 7.5 10*3/uL (ref 4.0–10.5)
nRBC: 0 % (ref 0.0–0.2)

## 2019-10-27 LAB — URINALYSIS, ROUTINE W REFLEX MICROSCOPIC
Bacteria, UA: NONE SEEN
Bilirubin Urine: NEGATIVE
Glucose, UA: NEGATIVE mg/dL
Ketones, ur: NEGATIVE mg/dL
Nitrite: NEGATIVE
Protein, ur: NEGATIVE mg/dL
Specific Gravity, Urine: 1.023 (ref 1.005–1.030)
pH: 5 (ref 5.0–8.0)

## 2019-10-27 LAB — BASIC METABOLIC PANEL
Anion gap: 9 (ref 5–15)
BUN: <5 mg/dL — ABNORMAL LOW (ref 6–20)
CO2: 22 mmol/L (ref 22–32)
Calcium: 8.7 mg/dL — ABNORMAL LOW (ref 8.9–10.3)
Chloride: 106 mmol/L (ref 98–111)
Creatinine, Ser: 0.57 mg/dL (ref 0.44–1.00)
GFR calc Af Amer: >60 mL/min (ref >60)
GFR calc non Af Amer: >60 mL/min (ref >60)
Glucose, Bld: 90 mg/dL (ref 70–99)
Potassium: 3.7 mmol/L (ref 3.5–5.1)
Sodium: 137 mmol/L (ref 135–145)

## 2019-10-27 LAB — HCG, QUANTITATIVE, PREGNANCY: hCG, Beta Chain, Quant, S: 154952 m[IU]/mL — ABNORMAL HIGH (ref <5)

## 2019-10-27 LAB — POC SARS CORONAVIRUS 2 AG -  ED: SARS Coronavirus 2 Ag: NEGATIVE

## 2019-10-27 NOTE — ED Triage Notes (Signed)
Pt in POV, reports HA, N/V, body aches, subjective fevers since this morning. Potential COVID exposure at work. Also reports she has been taking Advil for pain/fevers... Patient is [redacted] weeks pregnant, education done regarding NO NSAID USE WHEN PREGNANT.

## 2019-10-28 ENCOUNTER — Emergency Department (HOSPITAL_COMMUNITY)
Admission: EM | Admit: 2019-10-28 | Discharge: 2019-10-28 | Disposition: A | Discharge status: Home / Self Care | Payer: Medicaid Other | Source: Home / Self Care | Attending: Emergency Medicine | Admitting: Emergency Medicine

## 2019-10-28 DIAGNOSIS — R52 Pain, unspecified: Secondary | ICD-10-CM

## 2019-10-28 LAB — SARS CORONAVIRUS 2 (TAT 6-24 HRS): SARS Coronavirus 2: NEGATIVE

## 2019-10-28 LAB — INFLUENZA PANEL BY PCR (TYPE A & B)
Influenza A By PCR: NEGATIVE
Influenza B By PCR: NEGATIVE

## 2019-10-28 MED ORDER — ACETAMINOPHEN 325 MG PO TABS
650.0000 mg | ORAL_TABLET | Freq: Once | ORAL | Status: AC
  Administered 2019-10-28: 650 mg via ORAL
  Filled 2019-10-28: qty 2

## 2019-10-28 NOTE — ED Notes (Signed)
Pt called for vitals, no answer x3 

## 2019-10-28 NOTE — ED Triage Notes (Addendum)
Pt arrives via [facility] from home with c/o of body aches and headache since yesterday- covid exposure at work. [redacted] weeks pregnant.

## 2019-10-28 NOTE — ED Notes (Signed)
Pt called for vitals, no answer 

## 2019-10-28 NOTE — ED Provider Notes (Signed)
[facility] EMERGENCY DEPARTMENT Provider Note   CSN: [id number] Arrival date & time: 10/28/19  0488     History Chief Complaint  Patient presents with  . Generalized Body Aches    Barbara Ortiz is a 29 y.o. female.  HPI 29 year old [id number] female at [redacted] weeks gestation presents to the ER for generalized body aches which began 2 days ago.  She states that she had a Covid exposure at work approximately 1 week ago.  Endorses generalized body aches, nausea, 1 episode of vomiting, headache, low-grade fever (states it was approximately 99 self measured at home).  She states that she has been tolerating liquids okay but has not tried to eat anything in the last few days.  She states she does get a headache when she does not eat, thinks that this is the cause of her headache. She states she has taken Ibuprofen which she knows she is not supposed to in pregnancy but it has helped her headache.  She states that her doctor does not want her to keep Tylenol in her house as she does have a history of Tylenol overdose.  She denies cough, hematochezia, abdominal pain, diarrhea, anosmia, aguesia, abnormal vaginal discharge, pain, bleeding.     Past Medical History:  Diagnosis Date  . Anemia   . Anxiety   . Asthma   . 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 Bronchitis     Patient Active Problem List   Diagnosis Date Noted  . Constipation 10/04/2019  . Abdominal pain during pregnancy in first trimester 10/04/2019  . Intrauterine pregnancy 10/04/2019  . Supervision of other normal pregnancy, antepartum 09/11/2019  . Well woman exam with routine gynecological exam 01/25/2016  . History of abnormal cervical Pap smear 01/25/2016  . SAB (spontaneous abortion) 12/28/2015  . Pap smear of cervix with ASCUS, cannot exclude HGSIL 09/22/2015  . 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 CESAREAN SECTION    . DILATION AND CURETTAGE OF UTERUS       OB History    Gravida  8   Para  4   Term  4   Preterm  0   AB  3   Living  4     SAB  2   TAB  1   Ectopic  0   Multiple  0   Live Births  3           Family History  Problem Relation Age of Onset  . Hypertension Mother   . Diabetes Maternal Grandmother   . Hypertension Maternal Grandmother   . Diabetes Maternal Grandfather   . Hypertension Maternal Grandfather   . Cancer Paternal Grandmother     Social History   Tobacco Use  . Smoking status: Never Smoker  . Smokeless tobacco: Never Used  Substance Use Topics  . Alcohol use: Not Currently    Alcohol/week: 0.0 standard drinks  . Drug use: No    Home Medications Prior to Admission medications   Medication Sig Start Date End Date Taking? Authorizing Provider  Blood Pressure Monitoring (BLOOD PRESSURE KIT) DEVI 1 kit by Does not apply route once a week. Check Blood Pressure regularly and record readings into the Babyscripts App.  Large Cuff.  DX O90.0 09/11/19   Lajean Manes, CNM  metroNIDAZOLE (METROGEL) 0.75 % vaginal gel Place 1 Applicatorful  vaginally at bedtime. Apply one applicatorful to vagina at bedtime for 5 days 10/08/19   Shelly Bombard, MD  polyethylene glycol (MIRALAX / GLYCOLAX) 17 g packet Take 17 g by mouth daily. 10/04/19   Laury Deep, CNM    Allergies    Patient has no known allergies.  Review of Systems   Review of Systems  Constitutional: Positive for activity change, appetite change, chills, fatigue and fever.  HENT: Negative for ear pain and rhinorrhea.   Eyes: Negative for discharge.  Respiratory: Negative for cough, shortness of breath and wheezing.   Cardiovascular: Negative for chest pain, palpitations and leg swelling.  Gastrointestinal: Positive for nausea and vomiting. Negative for abdominal pain, blood in stool, constipation and diarrhea.  Genitourinary: Negative  for dyspareunia, dysuria, flank pain, pelvic pain, vaginal bleeding, vaginal discharge and vaginal pain.  Musculoskeletal: Positive for back pain and myalgias.  Skin: Negative for rash.  Neurological: Positive for headaches.  Psychiatric/Behavioral: Negative for confusion.    Physical Exam Updated Vital Signs BP 119/70 (BP Location: Right Arm)   Pulse 84   Temp 98.6 F (37 C) (Oral)   Resp 16   LMP 08/14/2019 (Exact Date)   SpO2 100%   Physical Exam Constitutional:      Appearance: Normal appearance.  HENT:     Head: Normocephalic and atraumatic.     Right Ear: Tympanic membrane normal.     Left Ear: Tympanic membrane normal.     Mouth/Throat:     Mouth: Mucous membranes are moist.  Eyes:     Extraocular Movements: Extraocular movements intact.     Pupils: Pupils are equal, round, and reactive to light.  Cardiovascular:     Rate and Rhythm: Normal rate and regular rhythm.  Pulmonary:     Effort: Pulmonary effort is normal.     Breath sounds: Normal breath sounds.  Abdominal:     General: Abdomen is flat.     Palpations: Abdomen is soft.     Tenderness: There is no abdominal tenderness. There is no guarding.  Musculoskeletal:        General: Tenderness present. Normal range of motion.     Cervical back: Normal range of motion and neck supple.     Comments: Bilateral paraspinal L-spine tenderness  Skin:    General: Skin is warm and dry.  Neurological:     General: No focal deficit present.     Mental Status: She is alert.  Psychiatric:        Mood and Affect: Mood normal.        Behavior: Behavior normal.     ED Results / Procedures / Treatments   Labs (all labs ordered are listed, but only abnormal results are displayed) Labs Reviewed  SARS CORONAVIRUS 2 (TAT 6-24 HRS)  RAPID INFLUENZA A&B ANTIGENS    EKG None  Radiology No results found.  Procedures Procedures (including critical care time)  Medications Ordered in ED Medications  acetaminophen  (TYLENOL) tablet 650 mg (has no administration in time range)    ED Course  I have reviewed the triage vital signs and the nursing notes.  Pertinent labs & imaging results that were available during my care of the patient were reviewed by me and considered in my medical decision making (see chart for details).    MDM Rules/Calculators/A&P                      29 year old [id number] at [redacted] weeks gestation presents generalized  body aches, nausea, 1 episode of non-bloody non bilious vomiting, weakness. Patient is alert, nontoxic-appearing, no acute distress.  Physical exam benign other than some paraspinal L-spine tenderness.  Patient states that home temperature was approximately 99, endorses fever, though this does not fever based on guidelines.  Vitals nonconcerning.  She denies any abdominal pain, vaginal discharge, abnormal vaginal bleeding.  Endorses a Covid exposure approximately a week ago at work.  Denies IV drug use.  No other red flags on history or physical exam.  Doubt any systemic infectious cause.  Suspect her symptoms are viral/Covid related.  Covid swab/flu for rule out.  Encouraged lots of fluids, Tylenol for headaches, fevers, body aches.  Discussed at length about her history of Tylenol overdose, patient does not have any suicidal thoughts or ideations at this time.  She feels comfortable taking a few low doses of OTC Tylenol at home.  She has been taking ibuprofen since she does not have any Tylenol at home, I have educated her on the dangers of taking this medication with pregnancy.  Encourage lots of fluids as tolerated.  Educated patient on quarantine peiod until she gets her Covid results, and possible further need for quarantine if her test results are positive.  Follow-up with PCP/OB if symptoms not improved.  Return precautions given.  At this stage the patient has been appropriately clinically screened and is stable for discharge.  Patient voiced understanding is agreeable to  this plan.   Final Clinical Impression(s) / ED Diagnoses Final diagnoses:  Body aches    Rx / DC Orders ED Discharge Orders    None       Garald Balding, PA-C 10/28/19 [place], DO 10/28/19 1102

## 2019-10-28 NOTE — Discharge Instructions (Addendum)
You were seen in the ER for generalized aches, nausea, weakness.  It is likely that your symptoms are due to a viral illness.  You have been swabbed for Covid and the flu.  It will take up to 6 to 24 hours for your Covid results to return.  Please make sure to quarantine in this time period until you get your results.  You may check the results via MyChart, if your test is positive you will receive a phone call from the health department.  If your test is positive, make sure to quarantine an additional 7 to 10 days.  Make sure to drink lots of fluids, Tylenol as needed.  Do not take NSAID medications while pregnant.  Please make sure to follow-up with your OB/GYN if needed.  Return to the ER if your symptoms worsen.

## 2019-10-30 ENCOUNTER — Encounter: Payer: Medicaid Other | Admitting: Certified Nurse Midwife

## 2019-11-06 ENCOUNTER — Telehealth: Payer: Self-pay

## 2019-11-06 NOTE — Telephone Encounter (Signed)
Return call to pt regarding lower abdominal / pelvic pain Pt notes colon issues pt advised on comfort measures for constipation and round ligament pain Pt advised to keep NOB appt and be sure to discuss medical Hx so that the provider may assist with addressing her colon issues etc Pt agreeable.

## 2019-11-13 ENCOUNTER — Encounter: Payer: Medicaid Other | Admitting: Obstetrics

## 2019-12-31 DIAGNOSIS — O34219 Maternal care for unspecified type scar from previous cesarean delivery: Secondary | ICD-10-CM | POA: Insufficient documentation

## 2020-05-09 DIAGNOSIS — O98513 Other viral diseases complicating pregnancy, third trimester: Secondary | ICD-10-CM | POA: Insufficient documentation

## 2020-05-09 DIAGNOSIS — U071 COVID-19: Secondary | ICD-10-CM | POA: Insufficient documentation

## 2020-05-13 ENCOUNTER — Other Ambulatory Visit: Payer: Self-pay

## 2020-05-13 ENCOUNTER — Encounter
Admission: EM | Disposition: A | Discharge status: Home / Self Care | Payer: Self-pay | Source: Home / Self Care | Attending: Obstetrics and Gynecology

## 2020-05-13 ENCOUNTER — Inpatient Hospital Stay: Payer: Medicaid Other | Admitting: Anesthesiology

## 2020-05-13 ENCOUNTER — Encounter: Payer: Self-pay | Admitting: Obstetrics and Gynecology

## 2020-05-13 ENCOUNTER — Inpatient Hospital Stay
Admission: EM | Admit: 2020-05-13 | Discharge: 2020-05-15 | DRG: 787 | Disposition: A | Discharge status: Home / Self Care | Payer: Medicaid Other | Attending: Obstetrics and Gynecology | Admitting: Obstetrics and Gynecology

## 2020-05-13 DIAGNOSIS — D62 Acute posthemorrhagic anemia: Secondary | ICD-10-CM | POA: Diagnosis not present

## 2020-05-13 DIAGNOSIS — O34211 Maternal care for low transverse scar from previous cesarean delivery: Principal | ICD-10-CM | POA: Diagnosis present

## 2020-05-13 DIAGNOSIS — Z8616 Personal history of COVID-19: Secondary | ICD-10-CM | POA: Diagnosis not present

## 2020-05-13 DIAGNOSIS — O99344 Other mental disorders complicating childbirth: Secondary | ICD-10-CM | POA: Diagnosis present

## 2020-05-13 DIAGNOSIS — F419 Anxiety disorder, unspecified: Secondary | ICD-10-CM | POA: Diagnosis present

## 2020-05-13 DIAGNOSIS — O9081 Anemia of the puerperium: Secondary | ICD-10-CM | POA: Diagnosis not present

## 2020-05-13 DIAGNOSIS — O339 Maternal care for disproportion, unspecified: Secondary | ICD-10-CM | POA: Diagnosis present

## 2020-05-13 DIAGNOSIS — O26893 Other specified pregnancy related conditions, third trimester: Secondary | ICD-10-CM | POA: Diagnosis present

## 2020-05-13 DIAGNOSIS — O34219 Maternal care for unspecified type scar from previous cesarean delivery: Secondary | ICD-10-CM | POA: Diagnosis present

## 2020-05-13 DIAGNOSIS — Z3A39 39 weeks gestation of pregnancy: Secondary | ICD-10-CM

## 2020-05-13 LAB — CBC
HCT: 33.6 % — ABNORMAL LOW (ref 36.0–46.0)
Hemoglobin: 11.2 g/dL — ABNORMAL LOW (ref 12.0–15.0)
MCH: 27.8 pg (ref 26.0–34.0)
MCHC: 33.3 g/dL (ref 30.0–36.0)
MCV: 83.4 fL (ref 80.0–100.0)
Platelets: 251 10*3/uL (ref 150–400)
RBC: 4.03 MIL/uL (ref 3.87–5.11)
RDW: 14.9 % (ref 11.5–15.5)
WBC: 9.3 10*3/uL (ref 4.0–10.5)
nRBC: 0 % (ref 0.0–0.2)

## 2020-05-13 LAB — URINE DRUG SCREEN, QUALITATIVE (ARMC ONLY)
Amphetamines, Ur Screen: NOT DETECTED
Barbiturates, Ur Screen: NOT DETECTED
Benzodiazepine, Ur Scrn: NOT DETECTED
Cannabinoid 50 Ng, Ur ~~LOC~~: NOT DETECTED
Cocaine Metabolite,Ur ~~LOC~~: NOT DETECTED
MDMA (Ecstasy)Ur Screen: NOT DETECTED
Methadone Scn, Ur: NOT DETECTED
Opiate, Ur Screen: NOT DETECTED
Phencyclidine (PCP) Ur S: NOT DETECTED
Tricyclic, Ur Screen: NOT DETECTED

## 2020-05-13 LAB — TYPE AND SCREEN
ABO/RH(D): O POS
Antibody Screen: NEGATIVE

## 2020-05-13 LAB — HEPATITIS PANEL, ACUTE
HCV Ab: NONREACTIVE
Hep A IgM: NONREACTIVE
Hep B C IgM: NONREACTIVE
Hepatitis B Surface Ag: NONREACTIVE

## 2020-05-13 SURGERY — Surgical Case
Anesthesia: Epidural

## 2020-05-13 MED ORDER — CEFAZOLIN SODIUM-DEXTROSE 2-4 GM/100ML-% IV SOLN
2.0000 g | Freq: Once | INTRAVENOUS | Status: DC
  Filled 2020-05-13: qty 100

## 2020-05-13 MED ORDER — TETANUS-DIPHTH-ACELL PERTUSSIS 5-2.5-18.5 LF-MCG/0.5 IM SUSY: 0.5000 mL | PREFILLED_SYRINGE | Freq: Once | INTRAMUSCULAR | Status: DC

## 2020-05-13 MED ORDER — OXYCODONE HCL 5 MG PO TABS
5.0000 mg | ORAL_TABLET | ORAL | Status: DC | PRN
  Administered 2020-05-14 – 2020-05-15 (×4): 10 mg via ORAL
  Filled 2020-05-13 (×4): qty 2

## 2020-05-13 MED ORDER — FENTANYL CITRATE (PF) 100 MCG/2ML IJ SOLN: 25.0000 ug | INTRAMUSCULAR | Status: DC | PRN

## 2020-05-13 MED ORDER — LIDOCAINE HCL (PF) 1 % IJ SOLN: 30.0000 mL | INTRAMUSCULAR | Status: DC | PRN

## 2020-05-13 MED ORDER — SODIUM CHLORIDE 0.9 % IV SOLN
INTRAVENOUS | Status: DC | PRN
  Administered 2020-05-13: 30 ug/min via INTRAVENOUS

## 2020-05-13 MED ORDER — BUPIVACAINE LIPOSOME 1.3 % IJ SUSP: 20.0000 mL | Freq: Once | INTRAMUSCULAR | Status: DC

## 2020-05-13 MED ORDER — FENTANYL 2.5 MCG/ML W/ROPIVACAINE 0.15% IN NS 100 ML EPIDURAL (ARMC)
EPIDURAL | Status: AC
  Filled 2020-05-13: qty 100

## 2020-05-13 MED ORDER — PHENYLEPHRINE HCL (PRESSORS) 10 MG/ML IV SOLN
INTRAVENOUS | Status: DC | PRN
  Administered 2020-05-13: 100 ug via INTRAVENOUS

## 2020-05-13 MED ORDER — WITCH HAZEL-GLYCERIN EX PADS: 1.0000 "application " | MEDICATED_PAD | CUTANEOUS | Status: DC | PRN

## 2020-05-13 MED ORDER — ONDANSETRON HCL 4 MG/2ML IJ SOLN
INTRAMUSCULAR | Status: DC | PRN
  Administered 2020-05-13: 4 mg via INTRAVENOUS

## 2020-05-13 MED ORDER — SENNOSIDES-DOCUSATE SODIUM 8.6-50 MG PO TABS
2.0000 | ORAL_TABLET | ORAL | Status: DC
  Administered 2020-05-15: 2 via ORAL
  Filled 2020-05-13 (×2): qty 2

## 2020-05-13 MED ORDER — SODIUM CHLORIDE (PF) 0.9 % IJ SOLN
INTRAMUSCULAR | Status: AC
  Filled 2020-05-13: qty 50

## 2020-05-13 MED ORDER — FENTANYL 2.5 MCG/ML W/ROPIVACAINE 0.15% IN NS 100 ML EPIDURAL (ARMC)
EPIDURAL | Status: DC | PRN
Start: 2020-05-13 — End: 2020-05-13
  Administered 2020-05-13: 12 mL/h via EPIDURAL

## 2020-05-13 MED ORDER — FENTANYL CITRATE (PF) 100 MCG/2ML IJ SOLN
INTRAMUSCULAR | Status: AC
  Filled 2020-05-13: qty 2

## 2020-05-13 MED ORDER — MORPHINE SULFATE (PF) 2 MG/ML IV SOLN: 1.0000 mg | INTRAVENOUS | Status: AC | PRN

## 2020-05-13 MED ORDER — LIDOCAINE HCL (PF) 1 % IJ SOLN
INTRAMUSCULAR | Status: DC | PRN
  Administered 2020-05-13: 3 mL via SUBCUTANEOUS

## 2020-05-13 MED ORDER — MENTHOL 3 MG MT LOZG
1.0000 | LOZENGE | OROMUCOSAL | Status: DC | PRN
  Filled 2020-05-13: qty 9

## 2020-05-13 MED ORDER — MORPHINE SULFATE (PF) 0.5 MG/ML IJ SOLN
INTRAMUSCULAR | Status: AC
  Filled 2020-05-13: qty 10

## 2020-05-13 MED ORDER — SIMETHICONE 80 MG PO CHEW: 80.0000 mg | CHEWABLE_TABLET | ORAL | Status: DC | PRN

## 2020-05-13 MED ORDER — DIPHENHYDRAMINE HCL 25 MG PO CAPS
25.0000 mg | ORAL_CAPSULE | Freq: Four times a day (QID) | ORAL | Status: DC | PRN
  Administered 2020-05-13: 25 mg via ORAL
  Filled 2020-05-13: qty 1

## 2020-05-13 MED ORDER — CEFAZOLIN SODIUM-DEXTROSE 2-3 GM-%(50ML) IV SOLR
INTRAVENOUS | Status: DC | PRN
  Administered 2020-05-13: 2 g via INTRAVENOUS

## 2020-05-13 MED ORDER — LACTATED RINGERS IV SOLN
INTRAVENOUS | Status: DC
  Administered 2020-05-13 (×2): 1000 mL via INTRAVENOUS

## 2020-05-13 MED ORDER — LACTATED RINGERS IV SOLN: INTRAVENOUS | Status: DC

## 2020-05-13 MED ORDER — HEMOSTATIC AGENTS (NO CHARGE) OPTIME
TOPICAL | Status: DC | PRN
  Administered 2020-05-13: 1 via TOPICAL

## 2020-05-13 MED ORDER — NALBUPHINE HCL 10 MG/ML IJ SOLN
2.5000 mg | Freq: Four times a day (QID) | INTRAMUSCULAR | Status: DC | PRN
  Administered 2020-05-13: 2.5 mg via INTRAVENOUS

## 2020-05-13 MED ORDER — OXYTOCIN-SODIUM CHLORIDE 30-0.9 UT/500ML-% IV SOLN
2.5000 [IU]/h | INTRAVENOUS | Status: DC
  Administered 2020-05-13: 30 [IU] via INTRAVENOUS

## 2020-05-13 MED ORDER — MORPHINE SULFATE (PF) 0.5 MG/ML IJ SOLN
INTRAMUSCULAR | Status: DC | PRN
  Administered 2020-05-13: 3 mg via EPIDURAL

## 2020-05-13 MED ORDER — PRENATAL MULTIVITAMIN CH
1.0000 | ORAL_TABLET | Freq: Every day | ORAL | Status: DC
  Administered 2020-05-14 – 2020-05-15 (×2): 1 via ORAL
  Filled 2020-05-13 (×2): qty 1

## 2020-05-13 MED ORDER — SIMETHICONE 80 MG PO CHEW
80.0000 mg | CHEWABLE_TABLET | ORAL | Status: DC
  Filled 2020-05-13: qty 1

## 2020-05-13 MED ORDER — KETOROLAC TROMETHAMINE 30 MG/ML IJ SOLN: 15.0000 mg | Freq: Four times a day (QID) | INTRAMUSCULAR | Status: AC | PRN

## 2020-05-13 MED ORDER — BUPIVACAINE LIPOSOME 1.3 % IJ SUSP
INTRAMUSCULAR | Status: AC
  Filled 2020-05-13: qty 20

## 2020-05-13 MED ORDER — LIDOCAINE HCL (PF) 2 % IJ SOLN
INTRAMUSCULAR | Status: DC | PRN
  Administered 2020-05-13: 60 mg via INTRADERMAL
  Administered 2020-05-13: 480 mg via INTRADERMAL

## 2020-05-13 MED ORDER — LIDOCAINE-EPINEPHRINE (PF) 1.5 %-1:200000 IJ SOLN
INTRAMUSCULAR | Status: DC | PRN
  Administered 2020-05-13: 3 mL via EPIDURAL

## 2020-05-13 MED ORDER — SODIUM CHLORIDE FLUSH 0.9 % IV SOLN
INTRAVENOUS | Status: DC | PRN
  Administered 2020-05-13: 100 mL

## 2020-05-13 MED ORDER — KETOROLAC TROMETHAMINE 30 MG/ML IJ SOLN
INTRAMUSCULAR | Status: DC | PRN
  Administered 2020-05-13: 30 mg via INTRAVENOUS

## 2020-05-13 MED ORDER — ONDANSETRON HCL 4 MG/2ML IJ SOLN
4.0000 mg | Freq: Four times a day (QID) | INTRAMUSCULAR | Status: DC | PRN
  Administered 2020-05-13: 4 mg via INTRAVENOUS
  Filled 2020-05-13: qty 2

## 2020-05-13 MED ORDER — BUPIVACAINE HCL (PF) 0.5 % IJ SOLN
INTRAMUSCULAR | Status: AC
  Filled 2020-05-13: qty 30

## 2020-05-13 MED ORDER — OXYTOCIN-SODIUM CHLORIDE 30-0.9 UT/500ML-% IV SOLN: 2.5000 [IU]/h | INTRAVENOUS | Status: AC

## 2020-05-13 MED ORDER — IBUPROFEN 800 MG PO TABS: 800.0000 mg | ORAL_TABLET | Freq: Four times a day (QID) | ORAL | Status: DC

## 2020-05-13 MED ORDER — TERBUTALINE SULFATE 1 MG/ML IJ SOLN: 0.2500 mg | Freq: Once | INTRAMUSCULAR | Status: DC | PRN

## 2020-05-13 MED ORDER — OXYTOCIN BOLUS FROM INFUSION: 333.0000 mL | Freq: Once | INTRAVENOUS | Status: DC

## 2020-05-13 MED ORDER — FENTANYL CITRATE (PF) 100 MCG/2ML IJ SOLN
INTRAMUSCULAR | Status: DC | PRN
  Administered 2020-05-13: 50 ug via EPIDURAL

## 2020-05-13 MED ORDER — FENTANYL CITRATE (PF) 100 MCG/2ML IJ SOLN
INTRAMUSCULAR | Status: AC
  Administered 2020-05-13: 25 ug via INTRAVENOUS
  Filled 2020-05-13: qty 2

## 2020-05-13 MED ORDER — GABAPENTIN 300 MG PO CAPS
300.0000 mg | ORAL_CAPSULE | Freq: Every day | ORAL | Status: DC
  Administered 2020-05-13 – 2020-05-14 (×2): 300 mg via ORAL
  Filled 2020-05-13 (×2): qty 1

## 2020-05-13 MED ORDER — DIBUCAINE (PERIANAL) 1 % EX OINT: 1.0000 "application " | TOPICAL_OINTMENT | CUTANEOUS | Status: DC | PRN

## 2020-05-13 MED ORDER — SIMETHICONE 80 MG PO CHEW
80.0000 mg | CHEWABLE_TABLET | Freq: Three times a day (TID) | ORAL | Status: DC
  Administered 2020-05-14 – 2020-05-15 (×4): 80 mg via ORAL
  Filled 2020-05-13 (×4): qty 1

## 2020-05-13 MED ORDER — EPHEDRINE SULFATE-NACL 50-0.9 MG/10ML-% IV SOSY
PREFILLED_SYRINGE | INTRAVENOUS | Status: DC | PRN
  Administered 2020-05-13 (×2): 10 mg via INTRAVENOUS

## 2020-05-13 MED ORDER — LACTATED RINGERS IV SOLN
500.0000 mL | INTRAVENOUS | Status: DC | PRN
  Administered 2020-05-13: 1000 mL via INTRAVENOUS
  Administered 2020-05-13: 500 mL via INTRAVENOUS

## 2020-05-13 MED ORDER — SOD CITRATE-CITRIC ACID 500-334 MG/5ML PO SOLN: 30.0000 mL | ORAL | Status: DC | PRN

## 2020-05-13 MED ORDER — NALBUPHINE HCL 10 MG/ML IJ SOLN
INTRAMUSCULAR | Status: AC
  Filled 2020-05-13: qty 1

## 2020-05-13 MED ORDER — COCONUT OIL OIL: 1.0000 "application " | TOPICAL_OIL | Status: DC | PRN

## 2020-05-13 MED ORDER — OXYTOCIN-SODIUM CHLORIDE 30-0.9 UT/500ML-% IV SOLN
1.0000 m[IU]/min | INTRAVENOUS | Status: DC
  Administered 2020-05-13: 2 m[IU]/min via INTRAVENOUS
  Filled 2020-05-13: qty 500

## 2020-05-13 MED ORDER — ONDANSETRON HCL 4 MG/2ML IJ SOLN: 4.0000 mg | Freq: Once | INTRAMUSCULAR | Status: DC | PRN

## 2020-05-13 MED ORDER — ENOXAPARIN SODIUM 40 MG/0.4ML ~~LOC~~ SOLN
40.0000 mg | SUBCUTANEOUS | Status: DC
  Administered 2020-05-14: 40 mg via SUBCUTANEOUS
  Filled 2020-05-13: qty 0.4

## 2020-05-13 MED ORDER — BUPIVACAINE HCL (PF) 0.25 % IJ SOLN
INTRAMUSCULAR | Status: DC | PRN
  Administered 2020-05-13: 3 mL via EPIDURAL
  Administered 2020-05-13: 4 mL via EPIDURAL

## 2020-05-13 MED ORDER — ZOLPIDEM TARTRATE 5 MG PO TABS: 5.0000 mg | ORAL_TABLET | Freq: Every evening | ORAL | Status: DC | PRN

## 2020-05-13 MED ORDER — MORPHINE SULFATE (PF) 2 MG/ML IV SOLN: 1.0000 mg | INTRAVENOUS | Status: DC | PRN

## 2020-05-13 MED ORDER — KETOROLAC TROMETHAMINE 30 MG/ML IJ SOLN
30.0000 mg | Freq: Four times a day (QID) | INTRAMUSCULAR | Status: DC
  Administered 2020-05-14: 30 mg via INTRAVENOUS
  Filled 2020-05-13: qty 1

## 2020-05-13 MED ORDER — ACETAMINOPHEN 325 MG PO TABS: 650.0000 mg | ORAL_TABLET | ORAL | Status: DC | PRN

## 2020-05-13 SURGICAL SUPPLY — 28 items
BARRIER ADHS 3X4 INTERCEED (GAUZE/BANDAGES/DRESSINGS) ×2 IMPLANT
CANISTER SUCT 3000ML PPV (MISCELLANEOUS) ×2 IMPLANT
CHLORAPREP W/TINT 26 (MISCELLANEOUS) ×2 IMPLANT
COVER WAND RF STERILE (DRAPES) ×2 IMPLANT
DRSG EMULSION OIL 3X8 NADH (GAUZE/BANDAGES/DRESSINGS) ×2 IMPLANT
DRSG TELFA 3X8 NADH (GAUZE/BANDAGES/DRESSINGS) ×2 IMPLANT
ELECT CAUTERY BLADE 6.4 (BLADE) ×2 IMPLANT
ELECT REM PT RETURN 9FT ADLT (ELECTROSURGICAL) ×2
ELECTRODE REM PT RTRN 9FT ADLT (ELECTROSURGICAL) ×1 IMPLANT
GAUZE SPONGE 4X4 12PLY STRL (GAUZE/BANDAGES/DRESSINGS) ×2 IMPLANT
GAUZE XEROFORM 4X4 STRL (GAUZE/BANDAGES/DRESSINGS) ×2 IMPLANT
GLOVE SURG SYN 8.0 (GLOVE) ×2 IMPLANT
GOWN STRL REUS W/ TWL LRG LVL3 (GOWN DISPOSABLE) ×2 IMPLANT
GOWN STRL REUS W/ TWL XL LVL3 (GOWN DISPOSABLE) ×1 IMPLANT
GOWN STRL REUS W/TWL LRG LVL3 (GOWN DISPOSABLE) ×2
GOWN STRL REUS W/TWL XL LVL3 (GOWN DISPOSABLE) ×1
NEEDLE HYPO 22GX1.5 SAFETY (NEEDLE) ×2 IMPLANT
NS IRRIG 1000ML POUR BTL (IV SOLUTION) ×2 IMPLANT
PACK C SECTION AR (MISCELLANEOUS) ×2 IMPLANT
PAD OB MATERNITY 4.3X12.25 (PERSONAL CARE ITEMS) ×2 IMPLANT
PAD PREP 24X41 OB/GYN DISP (PERSONAL CARE ITEMS) ×2 IMPLANT
STAPLER INSORB 30 2030 C-SECTI (MISCELLANEOUS) ×2 IMPLANT
STRAP SAFETY 5IN WIDE (MISCELLANEOUS) ×2 IMPLANT
SUT CHROMIC 1 CTX 36 (SUTURE) ×6 IMPLANT
SUT PLAIN GUT 0 (SUTURE) ×4 IMPLANT
SUT VIC AB 0 CT1 36 (SUTURE) ×4 IMPLANT
SYR 30ML LL (SYRINGE) ×4 IMPLANT
TAPE PAPER MEDFIX 1IN X 10YD (GAUZE/BANDAGES/DRESSINGS) ×2 IMPLANT

## 2020-05-13 NOTE — Anesthesia Preprocedure Evaluation (Addendum)
Anesthesia Evaluation  Patient identified by MRN, date of birth, ID band Patient awake    Reviewed: Allergy & Precautions, H&P , NPO status   History of Anesthesia Complications Negative for: history of anesthetic complications  Airway Mallampati: III       Dental  (+) Teeth Intact   Pulmonary asthma ,    Pulmonary exam normal        Cardiovascular Normal cardiovascular exam     Neuro/Psych Anxiety    GI/Hepatic   Endo/Other    Renal/GU      Musculoskeletal   Abdominal   Peds  Hematology  (+) Blood dyscrasia, anemia ,   Anesthesia Other Findings   Reproductive/Obstetrics (+) Pregnancy                             Anesthesia Physical Anesthesia Plan  ASA: II  Anesthesia Plan: Epidural   Post-op Pain Management:    Induction:   PONV Risk Score and Plan:   Airway Management Planned:   Additional Equipment:   Intra-op Plan:   Post-operative Plan:   Informed Consent: I have reviewed the patients History and Physical, chart, labs and discussed the procedure including the risks, benefits and alternatives for the proposed anesthesia with the patient or authorized representative who has indicated his/her understanding and acceptance.       Plan Discussed with: Anesthesiologist  Anesthesia Plan Comments: (Will use epidural for C-S as patient with CPD and non reassuring strip.  Patient agrees)       Anesthesia Quick Evaluation

## 2020-05-13 NOTE — Progress Notes (Signed)
Labor Progress Note  Barbara Ortiz is a 29 y.o. [phone] at [redacted]w[redacted]d by ultrasound admitted for active labor  Subjective: comfortable after epidural but feeling pressure.   Objective: BP (!) 107/54   Pulse 77   Temp 98 F (36.7 C) (Axillary)   LMP 08/14/2019 (Exact Date)   SpO2 100%    Fetal Assessment: FHT:  FHR: 145 bpm, variability: moderate,  accelerations:  Abscent,  decelerations:  Present variable, late  Category/reactivity:  Cat II with variable and late decels with some UCs.  UC:  q2-72min SVE: 10/100/+1, in LOP presentation, attempted pushing in 2 positions with minimal descent. Epidural rate decreased and pitocin increased.   Membrane status: AROM at 1204 Amniotic color:  Thick brown Mec  Labs: Lab Results  Component Value Date   WBC 9.3 05/13/2020   HGB 11.2 (L) 05/13/2020   HCT 33.6 (L) 05/13/2020   MCV 83.4 05/13/2020   PLT 251 05/13/2020    Assessment / Plan: second stage labor, LOP presentation.   Labor: increasing Pitocin and decreasing epidural rate. Will resume pushing in . Pt feeling pressure but minimal coordination of muscles for pushing.  Preeclampsia:  no e/o pre-e Fetal Wellbeing:  Category II Pain Control:  Epidural I/D:  n/a Anticipated MOD:  TOLAC  Prudencio Pair Deundre Thong, CNM 05/13/2020, 4:51 PM

## 2020-05-13 NOTE — Progress Notes (Signed)
Pt has agreed to repeat LTCS . Risks of the procedure have been dicussed . All questions answered .  Pt declines sterilization

## 2020-05-13 NOTE — Transfer of Care (Signed)
Immediate Anesthesia Transfer of Care Note  Patient: Barbara Ortiz  Procedure(s) Performed: CESAREAN SECTION  Patient Location: LDR 1  Anesthesia Type:Epidural  Level of Consciousness: awake, alert  and oriented  Airway & Oxygen Therapy: Patient Spontanous Breathing  Post-op Assessment: Post -op Vital signs reviewed and stable  Post vital signs: stable  Last Vitals:  Vitals Value Taken Time  BP 113/58   Temp    Pulse 88 05/13/20 1934  Resp 21 05/13/20 1934  SpO2 100 % 05/13/20 1934  Vitals shown include unvalidated device data.  Last Pain:  Vitals:   05/13/20 1453  TempSrc: Axillary         Complications: No complications documented.

## 2020-05-13 NOTE — Brief Op Note (Signed)
05/13/2020  7:18 PM  PATIENT:  Barbara Ortiz  29 y.o. female  PRE-OPERATIVE DIAGNOSIS: 39+[redacted] weeks EGA , Non reassuring fetal monitoring ,meconium staining ,  failed TOLAC , Repeat Cesarean section  POST-OPERATIVE DIAGNOSIS:  39+0 weeks , Nonreassuring fetal monitoring, CPD  , meconium staining, failed TOLAC , elective repeat cesarean section , vigorous female  PROCEDURE:  Procedure(s): CESAREAN SECTION  SURGEON:  Surgeon(s) and Role:    * Adaria Hole, Ihor Austin, MD - Primary  PHYSICIAN ASSISTANT: Heloise Ochoa , cnm   ASSISTANTS: none   ANESTHESIA:   epidural  EBL:  QBL: 698 mL  IOF 800 cc, UO 100 cc  BLOOD ADMINISTERED:none  DRAINS: Urinary Catheter (Foley)   LOCAL MEDICATIONS USED:  MARCAINE    and BUPIVICAINE   SPECIMEN:  Source of Specimen:  placenta  DISPOSITION OF SPECIMEN:  PATHOLOGY  COUNTS:  YES  TOURNIQUET:  * No tourniquets in log *  DICTATION: .Other Dictation: Dictation Number verbal  PLAN OF CARE: Admit to inpatient   PATIENT DISPOSITION:  PACU - hemodynamically stable.   Delay start of Pharmacological VTE agent (>24hrs) due to surgical blood loss or risk of bleeding: not applicable

## 2020-05-13 NOTE — Op Note (Signed)
Barbara Ortiz, CRAKER MEDICAL RECORD JO:[id number] ACCOUNT [id number] DATE OF BIRTH:July 10, 1991 FACILITY: [facility] LOCATION: [facility] PHYSICIAN:Arless Vineyard Cloyde Reams, MD  OPERATIVE REPORT  DATE OF PROCEDURE:  05/13/2020  PREOPERATIVE DIAGNOSES: 1.  39 plus 0 weeks estimated gestational age. 2.  Nonreassuring fetal monitoring. 3.  Meconium staining. 4.  Failed trial of labor after cesarean section. 5.  Elective repeat cesarean section.  POSTOPERATIVE DIAGNOSES: 1.  39 plus 0 weeks estimated gestational age. 2.  Nonreassuring fetal monitoring. 3.  Cephalopelvic disproportion. 4.  Meconium staining. 5.  Failed trial of labor after cesarean section. 6.  Elective repeat cesarean section. 7.  Vigorous female delivered.  PROCEDURE:  Repeat low transverse cesarean section.  ANESTHESIA:  Surgical dosing of continuous lumbar epidural.  SURGEON:  Jennell Corner, MD  FIRST ASSISTANT:  Heloise Ochoa, certified nurse midwife.  INDICATIONS:  This is a 29 year old gravida 8, para 4.  The patient admitted on the day of the procedure in active labor.  The patient has had 3 previous vaginal deliveries and a primary cesarean section.  The patient elected for a trial of labor after  cesarean section.  The patient made it to complete and complete, 0 to +1 station when fetus developed recurrent late decelerations.  Fetal head was extremely wedged into the symphysis.  DESCRIPTION OF PROCEDURE:  After adequate surgical dosing of continuous lumbar epidural, the patient was placed in dorsal supine position with the hip roll under the right side.  The patient's abdomen was prepped and draped in sterile fashion.  The  patient did receive 2 grams IV Ancef prior to commencement of the case.  A timeout was performed.  A Pfannenstiel incision was made 2 fingerbreadths above the symphysis pubis.  Sharp dissection was used to identify the fascia which was scored in the  midline and opened in a transverse  fashion.  The superior aspect of the fascia was grasped with Kocher clamps and the recti muscles were dissected free.  Inferior aspect of the fascia was grasped with Kocher clamps and pyramidalis muscle was dissected  free.  Entry into the peritoneal cavity was accomplished sharply.  The vesicouterine peritoneal fold was identified, extremely attenuated lower uterine segment was noted.  A low transverse uterine incision was made.  Upon entry into the endometrial  cavity, meconium stained fluid was noted.  The incision was extended with blunt transverse traction.  Extremely wedged fetal head was elevated only with maximum effort with a nurse vaginal hand.  Ultimately, the head was brought to the incision and the  head was delivered followed by the shoulders and the body.  Surgeon's glove was changed.  The fetus was then placed on the abdomen and the cord was doubly clamped and transected, and infant was passed to nursery staff who assigned Apgar scores of 9 and  9.  Fetal weight 3300 grams.  Time of birth 27 on 05/13/2020.  The placenta was manually delivered and the uterus was exteriorized.  The endometrial cavity was wiped clean with laparotomy tape.  The uterine incision was closed with 1 chromic suture in  a running locking fashion.  Several figure-of-eight sutures were placed on the right angle where there was a small extension.  Ultimately, good hemostasis was noted.  Fallopian tubes and ovaries appeared normal.  Posterior cul-de-sac was irrigated and  suctioned for hemostasis and the uterus was placed back into the abdominal cavity.  The paracolic gutters were wiped clean with laparotomy tape.  Uterine incision again appeared hemostatic and Interceed was  placed over the uterine incision in a T-shaped  fashion.  The fascia was then closed with 0 Vicryl suture in a running nonlocking fashion, 2 separate sutures were used.  The fascial edges were then injected with a solution of 20 mL of 1.3% Exparel  plus 30 mL of 0.5% Marcaine plus 50 mL normal saline;  50 mL of this solution was injected at the fascial edges.  Subcutaneous tissues were irrigated and bovied for hemostasis and the skin was reapproximated with Insorb absorbable staples.  An additional 20 mL of Exparel solution was injected beneath the  skin.  There were no complications.  QUANTITATIVE BLOOD LOSS:  698 mL.  INTRAOPERATIVE FLUIDS:  800 mL.  URINE OUTPUT:  100 mL.  30 mg of Toradol was injected intravenously at the end of the case.  The patient was taken to recovery room in good condition.  HN/NUANCE  D:05/13/2020 T:05/13/2020 JOB:[id number]

## 2020-05-13 NOTE — Discharge Summary (Signed)
Obstetrical Discharge Summary  Patient Name: Barbara Ortiz DOB: 03-01-1991 MRN: [id number]  Date of Admission: 05/13/2020 Date of Delivery: 05/13/20 Delivered by: Huel Cote MD Date of Discharge: 05/15/2020  Primary OB: [facility]   GXQ:JJHERDE'Y last menstrual period was 08/14/2019 (exact date). EDC Estimated Date of Delivery: 05/20/20 Gestational Age at Delivery: [redacted]w[redacted]d  Antepartum complications:  [facility]. [facility] with 2 layer closure.  2. Hx Anxiety, started on Zoloft during pregnancy 3. Reactive Hep C ab, negative HepC RNA 4. Covid-19 infection in third trimester - positive test 04/22/2020  Admitting Diagnosis: LAbor TOLAC, thick meconium  Secondary Diagnosis:NRFHR, CPD, repeat LTCS Patient Active Problem List   Diagnosis Date Noted  . COVID-19 affecting pregnancy in third trimester 05/09/2020  . Previous cesarean delivery affecting pregnancy 12/31/2019  . Constipation 10/04/2019  . High-risk pregnancy, young multigravida in third trimester 10/04/2019  . Supervision of other normal pregnancy, antepartum 09/11/2019  . Pap smear of cervix with ASCUS, cannot exclude HGSIL 09/22/2015  . 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 Anemia 05/06/2013  . History of postpartum depression 12/16/2012    Augmentation: AROM and Pitocin Complications: None Intrapartum complications/course: see Op note Date of Delivery: 05/13/20 Delivered By: THuel CoteMD Delivery Type: repeat cesarean section, low transverse incision Anesthesia: epidural Placenta: manual Laceration: none Episiotomy: none Newborn Data: Live born infant female "DElenore Rota Birth Weight:  7#4 APGAR: 9/9   Newborn Delivery   Birth date/time:  Delivery type:       Postpartum Procedures: None  Edinburgh:  Edinburgh Postnatal Depression Scale Screening Tool 05/15/2020 05/14/2020  I have been able to laugh and see the funny side of things. 0 (No Data)  I have looked forward with  enjoyment to things. 0 -  I have blamed myself unnecessarily when things went wrong. 1 -  I have been anxious or worried for no good reason. 1 -  I have felt scared or panicky for no good reason. 1 -  Things have been getting on top of me. 1 -  I have been so unhappy that I have had difficulty sleeping. 0 -  I have felt sad or miserable. 0 -  I have been so unhappy that I have been crying. 0 -  The thought of harming myself has occurred to me. 0 -  Edinburgh Postnatal Depression Scale Total 4 -     Cesarean Section:  Patient had an uncomplicated postpartum course.  By time of discharge on POD#2, her pain was controlled on oral pain medications; she had appropriate lochia and was ambulating, voiding without difficulty, tolerating regular diet and passing flatus.   She was deemed stable for discharge to home.    Discharge Physical Exam:  BP 128/71 (BP Location: Right Arm)   Pulse 83   Temp 97.9 F (36.6 C) (Oral)   Resp 18   LMP 08/14/2019 (Exact Date)   SpO2 100%   Breastfeeding Unknown   General: NAD CV: RRR Pulm: CTABL, nl effort ABD: s/nd/nt, fundus firm and below the umbilicus Lochia: moderate Incision: honeycomb dressing c/d/i DVT Evaluation: LE non-ttp, no evidence of DVT on exam.  Hemoglobin  Date Value Ref Range Status  05/14/2020 8.4 (L) 12.0 - 15.0 g/dL Final    Comment:    REPEATED TO VERIFY  07/04/2017 11.3 11.1 - 15.9 g/dL Final   HCT  Date Value Ref Range Status  05/14/2020 25.4 (L) 36 - 46 % Final   Hematocrit  Date  Value Ref Range Status  07/04/2017 33.0 (L) 34.0 - 46.6 % Final     Disposition: stable, discharge to home. Baby Feeding: breastmilk Baby Disposition: home with mom  Rh Immune globulin given: n/a Rubella vaccine given: immune Varicella vaccine: immune Tdap vaccine given in AP or PP setting: given 02/25/20 Flu vaccine given in AP or PP setting: offer prior to DC  Contraception: Nuvaring   Prenatal Labs:  Blood type/Rh O Pos   Antibody screen neg  Rubella Immune  Varicella Immune  RPR NR  HBsAg Neg  HIV NR  GC neg  Chlamydia neg  Genetic screening negative  1 hour GTT  111  3 hour GTT  n/a  GBS  negative      Plan:  Barbara Ortiz was discharged to home in good condition. Follow-up appointment with delivering provider in 2 weeks.  Discharge Medications: Allergies as of 05/15/2020      Reactions   Copper Swelling   Acetaminophen Other (See Comments)   Liver function      Medication List    STOP taking these medications   Blood Pressure Kit Devi   metroNIDAZOLE 0.75 % vaginal gel Commonly known as: METROGEL   polyethylene glycol 17 g packet Commonly known as: MIRALAX / GLYCOLAX     TAKE these medications   ferrous sulfate 325 (65 FE) MG tablet Take 1 tablet (325 mg total) by mouth 2 (two) times daily with a meal.   ibuprofen 600 MG tablet Commonly known as: ADVIL Take 1 tablet (600 mg total) by mouth every 6 (six) hours as needed for mild pain or moderate pain.   oxyCODONE 5 MG immediate release tablet Commonly known as: Oxy IR/ROXICODONE Take 1 tablet (5 mg total) by mouth every 4 (four) hours as needed for up to 7 days for moderate pain.   Prenatal 19 tablet Take 1 tablet by mouth daily.   sertraline 50 MG tablet Commonly known as: ZOLOFT Take 50 mg by mouth at bedtime.        Follow-up Information    Schermerhorn, Gwen Her, MD Follow up in 2 week(s).   Specialty: Obstetrics and Gynecology Why: post-op visit Contact information: [address] [place] [address] [phone]               Signed: Terance Ice 05/15/2020 3:26 PM  Drinda Butts, CNM Certified Nurse Midwife [facility]

## 2020-05-13 NOTE — Progress Notes (Signed)
Labor Progress Note  Barbara Ortiz is a 29 y.o. [phone] at [redacted]w[redacted]d by ultrasound admitted for active labor  Subjective: comfortable after epidural.   Objective: LMP 08/14/2019 (Exact Date)    Fetal Assessment: FHT:  FHR: 145 bpm, variability: moderate,  accelerations:  Abscent,  decelerations:  Absent Category/reactivity:  Cat I UC:  q26min SVE:   5/80/-2, BBOW, AROM performed, copious amt of thick brown meconium fluid noted.   Membrane status: AROM at 1204 Amniotic color:  Thick brown Mec  Labs: Lab Results  Component Value Date   WBC 9.3 05/13/2020   HGB 11.2 (L) 05/13/2020   HCT 33.6 (L) 05/13/2020   MCV 83.4 05/13/2020   PLT 251 05/13/2020    Assessment / Plan: Spontaneous labor, progressing normally  Labor: Progressing normally Preeclampsia:  no e/o pre-e Fetal Wellbeing:  Category I Pain Control:  Epidural I/D:  n/a Anticipated MOD:  TOLAC  Prudencio Pair Mallary Kreger, CNM 05/13/2020, 12:16 PM

## 2020-05-13 NOTE — Anesthesia Procedure Notes (Signed)
Epidural Patient location during procedure: OB Start time: 05/13/2020 10:40 AM End time: 05/13/2020 10:48 AM  Staffing Anesthesiologist: Naomie Dean, MD Resident/CRNA: Karoline Caldwell, CRNA Performed: anesthesiologist   Preanesthetic Checklist Completed: patient identified, IV checked, site marked, risks and benefits discussed, surgical consent, monitors and equipment checked, pre-op evaluation and timeout performed  Epidural Patient position: sitting Prep: ChloraPrep Patient monitoring: heart rate, continuous pulse ox and blood pressure Approach: midline Location: L3-L4 Injection technique: LOR air  Needle:  Needle type: Tuohy  Needle gauge: 17 G Needle length: 9 cm and 9 Needle insertion depth: 6 cm Catheter type: closed end flexible Catheter size: 19 Gauge Catheter at skin depth: 11 cm Test dose: negative and 1.5% lidocaine with Epi 1:200 K  Assessment Sensory level: T10 Events: blood not aspirated, injection not painful, no injection resistance, no paresthesia and negative IV test  Additional Notes 1 attempt Pt. Evaluated and documentation done after procedure finished. Patient identified. Risks/Benefits/Options discussed with patient including but not limited to bleeding, infection, nerve damage, paralysis, failed block, incomplete pain control, headache, blood pressure changes, nausea, vomiting, reactions to medication both or allergic, itching and postpartum back pain. Confirmed with bedside nurse the patient's most recent platelet count. Confirmed with patient that they are not currently taking any anticoagulation, have any bleeding history or any family history of bleeding disorders. Patient expressed understanding and wished to proceed. All questions were answered. Sterile technique was used throughout the entire procedure. Please see nursing notes for vital signs. Test dose was given through epidural catheter and negative prior to continuing to dose epidural or  start infusion. Warning signs of high block given to the patient including shortness of breath, tingling/numbness in hands, complete motor block, or any concerning symptoms with instructions to call for help. Patient was given instructions on fall risk and not to get out of bed. All questions and concerns addressed with instructions to call with any issues or inadequate analgesia.   Patient tolerated the insertion well without immediate complications.Reason for block:procedure for pain

## 2020-05-13 NOTE — Progress Notes (Signed)
Barbara Ortiz is a 29 y.o. [phone] at [redacted]w[redacted]d byTOLAC with cx c/c +1 station ++ caput and molding . wedged up against the symphysis  Subjective:   Objective: BP 98/61   Pulse 84   Temp 98 F (36.7 C) (Axillary)   LMP 08/14/2019 (Exact Date)   SpO2 100%  No intake/output data recorded. Total I/O In: -  Out: 450 [Urine:450]  FHT:  140 , decreased variability, recurrent late decels for > 1 hr   UC:   regular, every 2-3 min  minutes SVE:   Dilation: 10 Effacement (%): 100 Station: 0 Exam by:: Elyn Peers RN  Labs: Lab Results  Component Value Date   WBC 9.3 05/13/2020   HGB 11.2 (L) 05/13/2020   HCT 33.6 (L) 05/13/2020   MCV 83.4 05/13/2020   PLT 251 05/13/2020    Assessment / Plan: Non reassuring fetal monitoring with TOLAC . Suspect CPD  I have recommended Repeat LTCS  Given clinical scenerio .  She will talk to family   Suzy Bouchard 05/13/2020, 5:27 PM

## 2020-05-13 NOTE — H&P (Signed)
OB History & Physical   History of Present Illness:  Chief Complaint: painful UCs since 0500  HPI:  Barbara Ortiz is a 29 y.o. [id number] female at 78w0ddated [facility] [place] [facility] She presents to L&D for incrased painful UCs, with suspected ROM at 0500. Reports active FM, had fluid leaking that was brownish, but none since.   Pregnancy Issues: [facility] with 2 layer closure.  2. Hx Anxiety, started on Zoloft during pregnancy 3. Reactive Hep C ab, negative HepC RNA   Maternal Medical History:   Past Medical History:  Diagnosis Date  . Anemia   . Anxiety   . Asthma   . 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 Bronchitis     Past Surgical History:  Procedure Laterality Date  . CESAREAN SECTION    . DILATION AND CURETTAGE OF UTERUS      No Known Allergies  Prior to Admission medications   Medication Sig Start Date End Date Taking? Authorizing Provider  Blood Pressure Monitoring (BLOOD PRESSURE KIT) DEVI 1 kit by Does not apply route once a week. Check Blood Pressure regularly and record readings into the Babyscripts App.  Large Cuff.  DX O90.0 09/11/19   RoLajean ManesCNM  metroNIDAZOLE (METROGEL) 0.75 % vaginal gel Place 1 Applicatorful vaginally at bedtime. Apply one applicatorful to vagina at bedtime for 5 days 10/08/19   HaShelly BombardMD  polyethylene glycol (MIRALAX / GLYCOLAX) 17 g packet Take 17 g by mouth daily. 10/04/19   DaLaury DeepCNM     [facility]  Social History: She  reports that she has never smoked. She has never used smokeless tobacco. She reports previous alcohol use. She reports that she does not use drugs.  Family History: family history includes Cancer in her paternal grandmother; Diabetes in her maternal grandfather and maternal grandmother; Hypertension in her maternal grandfather, maternal  grandmother, and mother.   Review of Systems: A full review of systems was performed and negative except as noted in the HPI.     Physical Exam:  Vital Signs: LMP 08/14/2019 (Exact Date)  General: no acute distress.  HEENT: normocephalic, atraumatic Heart: regular rate & rhythm.  No murmurs/rubs/gallops Lungs: clear to auscultation bilaterally, normal respiratory effort Abdomen: soft, gravid, non-tender;  EFW:7.5 lbs, presentation cephalic and confirmed with Bedside [place] admit.  Pelvic:   External: Normal external female genitalia  Cervix: Dilation: 4 / Effacement (%): 90 / Station: -3    Extremities: non-tender, symmetric, no edema bilaterally.  DTRs: 2+  Neurologic: Alert & oriented x 3.    Results for orders placed or performed during the hospital encounter of 05/13/20 (from the past 24 hour(s))  CBC     Status: Abnormal   Collection Time: 05/13/20  9:54 AM  Result Value Ref Range   WBC 9.3 4.0 - 10.5 K/uL   RBC 4.03 3.87 - 5.11 MIL/uL   Hemoglobin 11.2 (L) 12.0 - 15.0 g/dL   HCT 33.6 (L) 36 - 46 %   MCV 83.4 80.0 - 100.0 fL   MCH 27.8 26.0 - 34.0 pg   MCHC 33.3 30.0 - 36.0 g/dL   RDW 14.9 11.5 - 15.5 %   Platelets 251 150 - 400 K/uL   nRBC 0.0 0.0 - 0.2 %  Type and screen ALSebree  Status: None   Collection Time: 05/13/20  9:54 AM  Result Value Ref Range   ABO/RH(D) O POS    Antibody Screen NEG    Sample Expiration      05/16/2020,2359 Performed at [facility], [address] [place], [place] [address]     Pertinent Results:  Prenatal Labs: Blood type/Rh O Pos  Antibody screen neg  Rubella Immune  Varicella Immune  RPR NR  HBsAg Neg  HIV NR  GC neg  Chlamydia neg  Genetic screening negative  1 hour GTT  111  3 hour GTT  n/a  GBS  negative   FHT: 145bpm,  Moderate variability, intermittent variable decels noted.  TOCO: q2-13mn SVE:  Dilation: 4 / Effacement (%): 90 / Station: -3    Cephalic by leopolds  No  results found.  Assessment:  DNou Chardis a 29y.o. [phone] at 375w0dith active labor and desires TOLAC.   Plan:  1. Admit to Labor & Delivery; consents reviewed and obtained - Dr ScOuida Sillsotified of pt admission, prior CS and reviewed Op report. [facility] and she wishes to proceed.   2. Fetal Well being  - Fetal Tracing: Cat II- will monitor closely.  - Group B Streptococcus ppx indicated: negative.  - Presentation: cephalic confirmed by [place] 3. Routine OB: - Prenatal labs reviewed, as above - Rh O Pos - CBC, T&S, RPR on admit - Clear fluids, IVF  4. Monitoring of Labor -  Contractions: external toco in place -  Pelvis proven to 7#5 -  Plan for induction with pitocin and AROM if needed.  -  Plan for continuous fetal monitoring  -  Maternal pain control as desired - Anticipate vaginal delivery   ReFrancetta FoundCNM 05/13/20 11:46 AM

## 2020-05-14 ENCOUNTER — Encounter: Payer: Self-pay | Admitting: Obstetrics and Gynecology

## 2020-05-14 LAB — CBC
HCT: 25.4 % — ABNORMAL LOW (ref 36.0–46.0)
Hemoglobin: 8.4 g/dL — ABNORMAL LOW (ref 12.0–15.0)
MCH: 28.1 pg (ref 26.0–34.0)
MCHC: 33.1 g/dL (ref 30.0–36.0)
MCV: 84.9 fL (ref 80.0–100.0)
Platelets: 199 10*3/uL (ref 150–400)
RBC: 2.99 MIL/uL — ABNORMAL LOW (ref 3.87–5.11)
RDW: 15.2 % (ref 11.5–15.5)
WBC: 13.4 10*3/uL — ABNORMAL HIGH (ref 4.0–10.5)
nRBC: 0 % (ref 0.0–0.2)

## 2020-05-14 LAB — RPR: RPR Ser Ql: NONREACTIVE

## 2020-05-14 MED ORDER — FERROUS SULFATE 325 (65 FE) MG PO TABS
325.0000 mg | ORAL_TABLET | Freq: Two times a day (BID) | ORAL | Status: DC
  Administered 2020-05-14 – 2020-05-15 (×2): 325 mg via ORAL
  Filled 2020-05-14 (×2): qty 1

## 2020-05-14 MED ORDER — IBUPROFEN 600 MG PO TABS
600.0000 mg | ORAL_TABLET | Freq: Four times a day (QID) | ORAL | Status: DC
  Administered 2020-05-14 – 2020-05-15 (×5): 600 mg via ORAL
  Filled 2020-05-14 (×5): qty 1

## 2020-05-14 NOTE — Progress Notes (Signed)
Post OP Day 1  Subjective: no complaints, tolerating PO and + flatus  Doing well, no concerns. Ambulating without difficulty, pain managed with PO meds, tolerating regular diet, and voiding without difficulty.   No fever/chills, chest pain, shortness of breath, nausea/vomiting, or leg pain. No nipple or breast pain. No headache, visual changes, or RUQ/epigastric pain.  Objective: BP 107/60 (BP Location: Left Arm)   Pulse 86   Temp 98.5 F (36.9 C) (Oral)   Resp 20   LMP 08/14/2019 (Exact Date)   SpO2 99%   Breastfeeding Unknown    Physical Exam:  General: alert, cooperative and no distress Breasts: soft/nontender CV: RRR Pulm: nl effort, CTABL Abdomen: soft, non-tender, active bowel sounds Uterine Fundus: firm Incision: no significant drainage Perineum: minimal edema, intact Lochia: appropriate DVT Evaluation: No evidence of DVT seen on physical exam.  Recent Labs    05/13/20 0954 05/14/20 0625  HGB 11.2* 8.4*  HCT 33.6* 25.4*  WBC 9.3 13.4*  PLT 251 199    Assessment/Plan: 29 y.o. [id number] postpartum day # 1  -Continue routine postpartum care -Lactation consult PRN for breastfeeding   -Acute blood loss anemia - hemodynamically stable and asymptomatic; start PO ferrous sulfate BID with stool softeners   Disposition: Continue inpatient postpartum care    LOS: 1 day   Gustavo Lah, CNM 05/14/2020, 8:47 AM   ----- Margaretmary Eddy  Certified Nurse Midwife [facility]

## 2020-05-14 NOTE — Lactation Note (Signed)
This note was copied from a baby's chart. Lactation Consultation Note  Patient Name: Boy Charmane Protzman [id number] Date: 05/14/2020 Reason for consult: Initial assessment;Term   Maternal Data Formula Feeding for Exclusion: No Does the patient have breastfeeding experience prior to this delivery?: Yes Mom does not have a breast pump yet, is still researching hands free electric models Feeding Feeding Type: Breast Fed Baby at breast when room entered, mom states baby just latched, observed a wide open mouth at the breast, baby in football hold, occ swallows noted LATCH Score Latch: Grasps breast easily, tongue down, lips flanged, rhythmical sucking.  Audible Swallowing: A few with stimulation  Type of Nipple: Everted at rest and after stimulation  Comfort (Breast/Nipple): Soft / non-tender  Hold (Positioning): No assistance needed to correctly position infant at breast.  LATCH Score: 9  Interventions Interventions: Breast feeding basics reviewed;Skin to skin  Lactation Tools Discussed/Used WIC Program: No LC name and no wriitten on white board  Consult Status Consult Status: PRN    Dyann Kief 05/14/2020, 12:04 PM

## 2020-05-14 NOTE — Anesthesia Postprocedure Evaluation (Signed)
Anesthesia Post Note  Patient: Barbara Ortiz  Procedure(s) Performed: CESAREAN SECTION  Patient location during evaluation: Mother Baby Anesthesia Type: Epidural Level of consciousness: awake and alert Pain management: pain level controlled Vital Signs Assessment: post-procedure vital signs reviewed and stable Respiratory status: spontaneous breathing, nonlabored ventilation and respiratory function stable Cardiovascular status: stable Postop Assessment: no headache, no backache and epidural receding Anesthetic complications: no   No complications documented.   Last Vitals:  Vitals:   05/14/20 0700 05/14/20 0755  BP:  107/60  Pulse: 84 86  Resp:  20  Temp:  36.9 C  SpO2: 99% 99%    Last Pain:  Vitals:   05/14/20 0755  TempSrc: Oral  PainSc:                  Karoline Caldwell

## 2020-05-14 NOTE — Anesthesia Post-op Follow-up Note (Signed)
  Anesthesia Pain Follow-up Note  Patient: Barbara Ortiz  Day #: 1  Date of Follow-up: 05/14/2020 Time: 8:32 AM  Last Vitals:  Vitals:   05/14/20 0700 05/14/20 0755  BP:  107/60  Pulse: 84 86  Resp:  20  Temp:  36.9 C  SpO2: 99% 99%    Level of Consciousness: alert  Pain: none   Side Effects:none  Catheter Site Exam:clean, dry, no drainage  Anti-Coag Meds (From admission, onward)   Start     Dose/Rate Route Frequency Ordered Stop   05/14/20 2100  enoxaparin (LOVENOX) injection 40 mg        40 mg Subcutaneous Every 24 hours 05/13/20 2236         Plan: D/C from anesthesia care at surgeon's request  Jackson Fetters Lawerance Cruel

## 2020-05-15 ENCOUNTER — Encounter: Payer: Self-pay | Admitting: Obstetrics and Gynecology

## 2020-05-15 MED ORDER — OXYCODONE HCL 5 MG PO TABS: 5.0000 mg | ORAL_TABLET | ORAL | 0 refills | Status: AC | PRN

## 2020-05-15 MED ORDER — FERROUS SULFATE 325 (65 FE) MG PO TABS: 325.0000 mg | ORAL_TABLET | Freq: Two times a day (BID) | ORAL | 3 refills | Status: DC

## 2020-05-15 MED ORDER — IBUPROFEN 600 MG PO TABS: 600.0000 mg | ORAL_TABLET | Freq: Four times a day (QID) | ORAL | 1 refills | Status: DC | PRN

## 2020-05-15 NOTE — Discharge Instructions (Signed)
° °Cesarean Delivery, Care After °Refer to this sheet in the next few weeks. These instructions provide you with information on caring for yourself after your procedure. Your health care provider may also give you specific instructions. Your treatment has been planned according to current medical practices, but problems sometimes occur. Call your health care provider if you have any problems or questions after you go home. °HOME CARE INSTRUCTIONS  °· If you have an On-Q pump, remove it on the 5th day after your surgery, by removing the dressing/bandage and pulling the pump out. Cover the site where the pump strings came out with a band-aid, as needed. °· Only take over-the-counter or prescription medications as directed by your health care provider. °· Do not drink alcohol, especially if you are breastfeeding or taking medication to relieve pain. °· Do not  smoke tobacco. °· Continue to use good perineal care. Good perineal care includes: °¨ Wiping your perineum from front to back. °¨ Keeping your perineum clean. °· Check your surgical cut (incision) daily for increased redness, drainage, swelling, or separation of skin. °· Shower and clean your incision gently with soap and water every day, by letting warm and soapy water run over the incision, and then pat it dry. If your health care provider says it is okay, leave the incision uncovered. Use a bandage (dressing) if the incision is draining fluid or appears irritated. If the adhesive strips across the incision do not fall off within 7 days, carefully peel them off, after a shower. °· Hug a pillow when coughing or sneezing until your incision is healed. This helps to relieve pain. °· Do not use tampons, douches or have sexual intercourse, until your health care provider says it is okay. °· Wear a well-fitting bra that provides breast support. °· Limit wearing support panties or control-top hose. °· Drink enough fluids to keep your urine clear or pale  yellow. °· Eat high-fiber foods such as whole grain cereals and breads, brown rice, beans, and fresh fruits and vegetables every day. These foods may help prevent or relieve constipation. °· Resume activities such as climbing stairs, driving, lifting, exercising, or traveling as directed by your health care provider. °· Try to have someone help you with your household activities and your newborn for at least a few days after you leave the hospital. °· Rest as much as possible. Try to rest or take a nap when your newborn is sleeping. °· Increase your activities gradually. °· Do not lift more than 15lbs until directed by a provider. °· Keep all of your scheduled postpartum appointments. It is very important to keep your scheduled follow-up appointments. At these appointments, your health care provider will be checking to make sure that you are healing physically and emotionally. °SEEK MEDICAL CARE IF:  °· You are passing large clots from your vagina. Save any clots to show your health care provider. °· You have a foul smelling discharge from your vagina. °· You have trouble urinating. °· You are urinating frequently. °· You have pain when you urinate. °· You have a change in your bowel movements. °· You have increasing redness, pain, or swelling near your incision. °· You have pus draining from your incision. °· Your incision is separating. °· You have painful, hard, or reddened breasts. °· You have a severe headache. °· You have blurred vision or see spots. °· You feel sad or depressed. °· You have thoughts of hurting yourself or your newborn. °· You have questions about your   care, the care of your newborn, or medications. °· You are dizzy or light-headed. °· You have a rash. °· You have pain, redness, or swelling at the site of the removed intravenous access (IV) tube. °· You have nausea or vomiting. °· You stopped breastfeeding and have not had a menstrual period within 12 weeks of stopping. °· You are not  breastfeeding and have not had a menstrual period within 12 weeks of delivery. °· You have a fever. °SEEK IMMEDIATE MEDICAL CARE IF: °· You have persistent pain. °· You have chest pain. °· You have shortness of breath. °· You faint. °· You have leg pain. °· You have stomach pain. °· Your vaginal bleeding saturates 2 or more sanitary pads in 1 hour. °MAKE SURE YOU:  °· Understand these instructions. °· Will watch your condition. °· Will get help right away if you are not doing well or get worse. °Document Released: 03/25/2002 Document Revised: 11/17/2013 Document Reviewed: 02/28/2012 °ExitCare® Patient Information ©2015 [employer]. This information is not intended to replace advice given to you by your health care provider. Make sure you discuss any questions you have with your health care provider. ° ° °

## 2020-05-15 NOTE — Progress Notes (Signed)
Patient Discharged home per provider. Pt educated about postpartum complications and informed when to call her MD or return to hospital for further evaluation. Pt instructed to keep all follow up appointments with her provider. AVS given to patient and RN answered all questions and patient has no further questions at this time. Pt discharged home in stable condition with family.

## 2020-05-17 LAB — SURGICAL PATHOLOGY

## 2021-02-22 ENCOUNTER — Ambulatory Visit (HOSPITAL_COMMUNITY)
Admission: RE | Admit: 2021-02-22 | Discharge: 2021-02-22 | Disposition: A | Discharge status: Home / Self Care | Payer: Medicaid Other | Attending: Psychiatry | Admitting: Psychiatry
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
## 2021-03-16 DIAGNOSIS — D509 Iron deficiency anemia, unspecified: Secondary | ICD-10-CM | POA: Insufficient documentation

## 2021-05-01 ENCOUNTER — Encounter (HOSPITAL_COMMUNITY): Payer: Self-pay | Admitting: Emergency Medicine

## 2021-05-01 ENCOUNTER — Other Ambulatory Visit: Payer: Self-pay

## 2021-05-01 ENCOUNTER — Ambulatory Visit (HOSPITAL_COMMUNITY)
Admission: EM | Admit: 2021-05-01 | Discharge: 2021-05-01 | Disposition: A | Discharge status: Home / Self Care | Payer: Medicaid Other | Attending: Emergency Medicine | Admitting: Emergency Medicine

## 2021-05-01 ENCOUNTER — Ambulatory Visit (HOSPITAL_COMMUNITY)
Admission: EM | Admit: 2021-05-01 | Discharge: 2021-05-01 | Disposition: A | Discharge status: Home / Self Care | Payer: Medicaid Other

## 2021-05-01 DIAGNOSIS — R112 Nausea with vomiting, unspecified: Secondary | ICD-10-CM

## 2021-05-01 DIAGNOSIS — R197 Diarrhea, unspecified: Secondary | ICD-10-CM | POA: Diagnosis not present

## 2021-05-01 LAB — POCT URINALYSIS DIPSTICK, ED / UC
Glucose, UA: NEGATIVE mg/dL
Hgb urine dipstick: NEGATIVE
Nitrite: NEGATIVE
Protein, ur: 30 mg/dL — AB
Specific Gravity, Urine: >=1.03 (ref 1.005–1.030)
Urobilinogen, UA: 0.2 mg/dL (ref 0.0–1.0)
pH: 5.5 (ref 5.0–8.0)

## 2021-05-01 LAB — POC URINE PREG, ED: Preg Test, Ur: NEGATIVE

## 2021-05-01 MED ORDER — DICYCLOMINE HCL 20 MG PO TABS: 20.0000 mg | ORAL_TABLET | Freq: Two times a day (BID) | ORAL | 0 refills | Status: DC

## 2021-05-01 NOTE — ED Provider Notes (Signed)
[facility]    CSN: [id number] Arrival date & time: 05/01/21  1452      History   Chief Complaint Chief Complaint  Patient presents with   Emesis    HPI Barbara Ortiz is a 30 y.o. female.   Patient here for evaluation of nausea, vomiting, diarrhea, and abdominal cramping that has been going on since yesterday.  Reports that she has been having the symptoms once a month since this summer.  Patient has been evaluated at her PCP office as well as another urgent care and was initially told that this was likely norovirus.  Reports that she has difficulty eating due to the nausea and vomiting.  Patient was seen last month for same at PCP and had x-ray and CT scan that were both without any acute abnormalities.  Reports that she has nausea medication but has not taken any today.  Denies any trauma, injury, or other precipitating event.  Denies any specific alleviating or aggravating factors.  Denies any fevers, chest pain, shortness of breath, N/V/D, numbness, tingling, weakness, abdominal pain, or headaches.    The history is provided by the patient.  Emesis Associated symptoms: abdominal pain and diarrhea   Associated symptoms: no fever    Past Medical History:  Diagnosis Date   Anemia    Anxiety    Asthma    Breast anomaly    repeated breast swelling, redness and bleeding from nipples not associated with breastfeeding   Bronchitis    SAB (spontaneous abortion) 12/28/2015    Patient Active Problem List   Diagnosis Date Noted   COVID-19 affecting pregnancy in third trimester 05/09/2020   Previous cesarean delivery affecting pregnancy 12/31/2019   Constipation 10/04/2019   High-risk pregnancy, young multigravida in third trimester 10/04/2019   Supervision of other normal pregnancy, antepartum 09/11/2019   Pap smear of cervix with ASCUS, cannot exclude HGSIL 09/22/2015   High risk HPV infection 09/22/2015   Anemia 05/06/2013   History of postpartum depression  12/16/2012    Past Surgical History:  Procedure Laterality Date   CESAREAN SECTION     CESAREAN SECTION  05/13/2020   Procedure: CESAREAN SECTION;  Surgeon: Suzy Bouchard, MD;  Location: [facility] ORS;  Service: Obstetrics;;   DILATION AND CURETTAGE OF UTERUS      OB History     Gravida  8   Para  5   Term  5   Preterm  0   AB  3   Living  5      SAB  2   IAB  1   Ectopic  0   Multiple  0   Live Births  4            Home Medications    Prior to Admission medications   Medication Sig Start Date End Date Taking? Authorizing Provider  dicyclomine (BENTYL) 20 MG tablet Take 1 tablet (20 mg total) by mouth 2 (two) times daily. 05/01/21  Yes Ivette Loyal, NP  Prenatal Vit-DSS-Fe Fum-FA (PRENATAL 19) tablet Take 1 tablet by mouth daily.   Yes [provider]  ferrous sulfate 325 (65 FE) MG tablet Take 1 tablet (325 mg total) by mouth 2 (two) times daily with a meal. 05/15/20   Gustavo Lah, CNM  ibuprofen (ADVIL) 600 MG tablet Take 1 tablet (600 mg total) by mouth every 6 (six) hours as needed for mild pain or moderate pain. 05/15/20   Gustavo Lah, CNM  sertraline (  ZOLOFT) 50 MG tablet Take 50 mg by mouth at bedtime. 11/27/19 11/26/20  [provider]    Family History Family History  Problem Relation Age of Onset   Hypertension Mother    Diabetes Maternal Grandmother    Hypertension Maternal Grandmother    Diabetes Maternal Grandfather    Hypertension Maternal Grandfather    Cancer Paternal Grandmother     Social History Social History   Tobacco Use   Smoking status: Never   Smokeless tobacco: Never  Vaping Use   Vaping Use: Never used  Substance Use Topics   Alcohol use: Not Currently    Alcohol/week: 0.0 standard drinks   Drug use: No     Allergies   Copper and Acetaminophen   Review of Systems Review of Systems  Constitutional:  Negative for fever.  Gastrointestinal:  Positive for abdominal pain, diarrhea,  nausea and vomiting. Negative for blood in stool.  All other systems reviewed and are negative.   Physical Exam Triage Vital Signs ED Triage Vitals  Enc Vitals Group     BP 05/01/21 1555 126/84     Pulse Rate 05/01/21 1555 71     Resp 05/01/21 1555 16     Temp 05/01/21 1555 98.9 F (37.2 C)     Temp Source 05/01/21 1555 Oral     SpO2 05/01/21 1555 98 %     Weight --      Height --      Head Circumference --      Peak Flow --      Pain Score 05/01/21 1603 6     Pain Loc --      Pain Edu? --      Excl. in [place]? --    No data found.  Updated Vital Signs BP 126/84 (BP Location: Right Arm)   Pulse 71   Temp 98.9 F (37.2 C) (Oral)   Resp 16   LMP 04/16/2021   SpO2 98%   Visual Acuity Right Eye Distance:   Left Eye Distance:   Bilateral Distance:    Right Eye Near:   Left Eye Near:    Bilateral Near:     Physical Exam Vitals and nursing note reviewed.  Constitutional:      General: She is not in acute distress.    Appearance: Normal appearance. She is not ill-appearing, toxic-appearing or diaphoretic.  HENT:     Head: Normocephalic and atraumatic.  Eyes:     Conjunctiva/sclera: Conjunctivae normal.  Cardiovascular:     Rate and Rhythm: Normal rate.     Pulses: Normal pulses.     Heart sounds: Normal heart sounds.  Pulmonary:     Effort: Pulmonary effort is normal.     Breath sounds: Normal breath sounds.  Abdominal:     General: Abdomen is flat. Bowel sounds are normal.     Tenderness: There is no abdominal tenderness. There is no right CVA tenderness, left CVA tenderness, guarding or rebound.  Musculoskeletal:        General: Normal range of motion.     Cervical back: Normal range of motion.  Skin:    General: Skin is warm and dry.  Neurological:     General: No focal deficit present.     Mental Status: She is alert and oriented to person, place, and time.  Psychiatric:        Mood and Affect: Mood normal.     UC Treatments / Results  Labs (all  labs ordered are  listed, but only abnormal results are displayed) Labs Reviewed  POCT URINALYSIS DIPSTICK, ED / UC - Abnormal; Notable for the following components:      Result Value   Bilirubin Urine SMALL (*)    Ketones, ur TRACE (*)    Protein, ur 30 (*)    Leukocytes,Ua TRACE (*)    All other components within normal limits  POC URINE PREG, ED    EKG   Radiology No results found.  Procedures Procedures (including critical care time)  Medications Ordered in UC Medications - No data to display  Initial Impression / Assessment and Plan / UC Course  I have reviewed the triage vital signs and the nursing notes.  Pertinent labs & imaging results that were available during my care of the patient were reviewed by me and considered in my medical decision making (see chart for details).    Assessment negative for red flags or concerns.  Urinalysis positive for bilirubin, ketones, protein, and leukocytes.  Urine pregnancy test negative.  Urinalysis likely related to dehydration versus contaminated specimen as patient does not have any symptoms of a UTI.  Nausea, vomiting, and diarrhea possibly related to IBS.  Will prescribe Bentyl for abdominal pain and cramping.  Continue taking antinausea medications as previously prescribed.  Encourage fluids and rest.  Recommend brat or bland food diet.  Follow-up with GI for reevaluation as soon as possible. Final Clinical Impressions(s) / UC Diagnoses   Final diagnoses:  Nausea vomiting and diarrhea     Discharge Instructions      You can take the Bentyl twice a day for abdominal pain and cramping.   You can continue to take the anti-nausea medication as previously prescribed.   Try a BRAT (bananas, rice, applesause, toast) or bland food diet for the next few days.  Stick with foods that are gentle on your stomach.  Avoid foods that are difficult to digest, such as diary.  As you feel better, you can start eating like you do normally.     You can use ginger (ginger ale, ginger candy) and mint for nausea.    Make sure you are drinking plenty of fluids, such as water, powerade/gatorade, pedialyte, juices, and teas.    Follow up with Gastroenterology for further evaluation.       ED Prescriptions     Medication Sig Dispense Auth. Provider   dicyclomine (BENTYL) 20 MG tablet Take 1 tablet (20 mg total) by mouth 2 (two) times daily. 20 tablet Ivette Loyal, NP      PDMP not reviewed this encounter.   Ivette Loyal, NP 05/01/21 [phone]

## 2021-05-01 NOTE — ED Triage Notes (Signed)
PT reports every few weeks she gets sick with cramping, diarrhea, and nausea.   She vomited yesterday x1.  Diarrhea x5 in 24 hours.   This is the fourth time this has happened since July. States it typically happens after her menstrual.

## 2021-05-01 NOTE — Discharge Instructions (Signed)
You can take the Bentyl twice a day for abdominal pain and cramping.   You can continue to take the anti-nausea medication as previously prescribed.   Try a BRAT (bananas, rice, applesause, toast) or bland food diet for the next few days.  Stick with foods that are gentle on your stomach.  Avoid foods that are difficult to digest, such as diary.  As you feel better, you can start eating like you do normally.    You can use ginger (ginger ale, ginger candy) and mint for nausea.    Make sure you are drinking plenty of fluids, such as water, powerade/gatorade, pedialyte, juices, and teas.    Follow up with Gastroenterology for further evaluation.

## 2021-06-29 DIAGNOSIS — G43009 Migraine without aura, not intractable, without status migrainosus: Secondary | ICD-10-CM | POA: Insufficient documentation

## 2021-07-29 ENCOUNTER — Telehealth: Payer: Self-pay | Admitting: Gastroenterology

## 2021-07-29 NOTE — Telephone Encounter (Signed)
Request received to transfer GI care from outside practice to [facility].  We appreciate the interest in our practice, however at this time due to high demand from patients without established GI providers we cannot accommodate this transfer.  Ability to accommodate future transfer requests may change over time and the patient can contact us again in 6-12 months if still interested in being seen at  GI.      °

## 2021-07-29 NOTE — Telephone Encounter (Signed)
Hi Dr. Lavon Paganini,   D.O.D   Patient called requesting a transfer of care over to [facility]. She has been seen before once over at [facility]. States she is having a lot of abdominal pain along with diarrhea. Records are available in Epic for you to review and advise on scheduling.   Thank you

## 2021-08-01 NOTE — Telephone Encounter (Signed)
Called and spoke with patient to advise she was not happy but said ok.

## 2021-09-06 DIAGNOSIS — J4520 Mild intermittent asthma, uncomplicated: Secondary | ICD-10-CM | POA: Insufficient documentation

## 2021-09-09 ENCOUNTER — Encounter (HOSPITAL_COMMUNITY): Payer: Self-pay

## 2021-09-09 ENCOUNTER — Other Ambulatory Visit: Payer: Self-pay

## 2021-09-09 ENCOUNTER — Ambulatory Visit (HOSPITAL_COMMUNITY)
Admission: RE | Admit: 2021-09-09 | Discharge: 2021-09-09 | Disposition: A | Discharge status: Home / Self Care | Payer: Medicaid Other | Source: Ambulatory Visit | Attending: Physician Assistant | Admitting: Physician Assistant

## 2021-09-09 VITALS — BP 139/82 | HR 76 | Temp 98.5°F | Resp 16

## 2021-09-09 DIAGNOSIS — M25551 Pain in right hip: Secondary | ICD-10-CM | POA: Diagnosis not present

## 2021-09-09 MED ORDER — CYCLOBENZAPRINE HCL 5 MG PO TABS: 10.0000 mg | ORAL_TABLET | Freq: Three times a day (TID) | ORAL | 0 refills | Status: DC | PRN

## 2021-09-09 NOTE — ED Triage Notes (Signed)
Pt reports R side hip pain x 1 week.  States she took a laxative thinking she was constipated. States the pain is now at her upper thigh.

## 2021-09-09 NOTE — Discharge Instructions (Addendum)
Recommend Ibuprofen as needed Take Flexeril as needed  Recommend ice and gentle stretching, lower back and legs Follow up with Primary Care Physician if no improvement

## 2021-09-09 NOTE — ED Provider Notes (Signed)
[facility]    CSN: [id number] Arrival date & time: 09/09/21  1543      History   Chief Complaint Chief Complaint  Patient presents with   Appointment    HPI Barbara Ortiz is a 31 y.o. female.   Pt complains of right groin pain that started about one week ago.  Denies injury or trauma.  She reports some radiation to the anterior right thigh.  Mild intermittent tingling.  She has tried stretching with minimal relief.  Pain is worse with movement.  She initially thought the pain was right lower abdomen associated with constipation.  She has taken a laxative and is now experiencing normal bowel movements.  Denies abdominal pain, nausea, or vomiting.  She denies back pain, lower extremity weakness, or saddle anesthesia. She recently completed prednisone dosepak for bronchitis.    Past Medical History:  Diagnosis Date   Anemia    Anxiety    Asthma    Breast anomaly    repeated breast swelling, redness and bleeding from nipples not associated with breastfeeding   Bronchitis    SAB (spontaneous abortion) 12/28/2015    Patient Active Problem List   Diagnosis Date Noted   COVID-19 affecting pregnancy in third trimester 05/09/2020   Previous cesarean delivery affecting pregnancy 12/31/2019   Constipation 10/04/2019   High-risk pregnancy, young multigravida in third trimester 10/04/2019   Supervision of other normal pregnancy, antepartum 09/11/2019   Pap smear of cervix with ASCUS, cannot exclude HGSIL 09/22/2015   High risk HPV infection 09/22/2015   Anemia 05/06/2013   History of postpartum depression 12/16/2012    Past Surgical History:  Procedure Laterality Date   CESAREAN SECTION     CESAREAN SECTION  05/13/2020   Procedure: CESAREAN SECTION;  Surgeon: Ouida Sills, Gwen Her, MD;  Location: [facility] ORS;  Service: Obstetrics;;   DILATION AND CURETTAGE OF UTERUS      OB History     Gravida  8   Para  5   Term  5   Preterm  0   AB  3   Living  5       SAB  2   IAB  1   Ectopic  0   Multiple  0   Live Births  4            Home Medications    Prior to Admission medications   Medication Sig Start Date End Date Taking? Authorizing Provider  cyclobenzaprine (FLEXERIL) 5 MG tablet Take 2 tablets (10 mg total) by mouth 3 (three) times daily as needed for muscle spasms. 09/09/21  Yes Ward, Lenise Arena, PA-C  dicyclomine (BENTYL) 20 MG tablet Take 1 tablet (20 mg total) by mouth 2 (two) times daily. 05/01/21   Pearson Forster, NP  ferrous sulfate 325 (65 FE) MG tablet Take 1 tablet (325 mg total) by mouth 2 (two) times daily with a meal. 05/15/20   Minda Meo, CNM  ibuprofen (ADVIL) 600 MG tablet Take 1 tablet (600 mg total) by mouth every 6 (six) hours as needed for mild pain or moderate pain. 05/15/20   Minda Meo, CNM  Prenatal Vit-DSS-Fe Fum-FA (PRENATAL 19) tablet Take 1 tablet by mouth daily.    [provider]  sertraline (ZOLOFT) 50 MG tablet Take 50 mg by mouth at bedtime. 11/27/19 11/26/20  [provider]    Family History Family History  Problem Relation Age of Onset   Hypertension Mother    Diabetes  Maternal Grandmother    Hypertension Maternal Grandmother    Diabetes Maternal Grandfather    Hypertension Maternal Grandfather    Cancer Paternal Grandmother     Social History Social History   Tobacco Use   Smoking status: Never   Smokeless tobacco: Never  Vaping Use   Vaping Use: Never used  Substance Use Topics   Alcohol use: Not Currently    Alcohol/week: 0.0 standard drinks   Drug use: No     Allergies   Copper and Acetaminophen   Review of Systems Review of Systems  Constitutional:  Negative for chills and fever.  HENT:  Negative for ear pain and sore throat.   Eyes:  Negative for pain and visual disturbance.  Respiratory:  Negative for cough and shortness of breath.   Cardiovascular:  Negative for chest pain and palpitations.  Gastrointestinal:  Negative for  abdominal distention, abdominal pain, constipation, diarrhea, nausea and vomiting.  Genitourinary:  Negative for dysuria and hematuria.  Musculoskeletal:  Positive for arthralgias (right hip pain). Negative for back pain.  Skin:  Negative for color change and rash.  Neurological:  Negative for seizures and syncope.  All other systems reviewed and are negative.   Physical Exam Triage Vital Signs ED Triage Vitals  Enc Vitals Group     BP 09/09/21 1603 139/82     Pulse Rate 09/09/21 1603 76     Resp 09/09/21 1603 16     Temp 09/09/21 1603 98.5 F (36.9 C)     Temp Source 09/09/21 1603 Oral     SpO2 09/09/21 1603 100 %     Weight --      Height --      Head Circumference --      Peak Flow --      Pain Score 09/09/21 1602 5     Pain Loc --      Pain Edu? --      Excl. in [place]? --    No data found.  Updated Vital Signs BP 139/82 (BP Location: Left Arm)    Pulse 76    Temp 98.5 F (36.9 C) (Oral)    Resp 16    LMP 08/12/2021 (Exact Date)    SpO2 100%   Visual Acuity Right Eye Distance:   Left Eye Distance:   Bilateral Distance:    Right Eye Near:   Left Eye Near:    Bilateral Near:     Physical Exam Vitals and nursing note reviewed.  Constitutional:      General: She is not in acute distress.    Appearance: She is well-developed.  HENT:     Head: Normocephalic and atraumatic.  Eyes:     Conjunctiva/sclera: Conjunctivae normal.  Cardiovascular:     Rate and Rhythm: Normal rate and regular rhythm.     Heart sounds: No murmur heard. Pulmonary:     Effort: Pulmonary effort is normal. No respiratory distress.     Breath sounds: Normal breath sounds.  Abdominal:     Palpations: Abdomen is soft.     Tenderness: There is no abdominal tenderness.  Musculoskeletal:        General: No swelling.     Cervical back: Neck supple.       Legs:     Comments: Negative SLR  Skin:    General: Skin is warm and dry.     Capillary Refill: Capillary refill takes less than 2  seconds.  Neurological:     Mental Status: She  is alert.  Psychiatric:        Mood and Affect: Mood normal.     UC Treatments / Results  Labs (all labs ordered are listed, but only abnormal results are displayed) Labs Reviewed - No data to display  EKG   Radiology No results found.  Procedures Procedures (including critical care time)  Medications Ordered in UC Medications - No data to display  Initial Impression / Assessment and Plan / UC Course  I have reviewed the triage vital signs and the nursing notes.  Pertinent labs & imaging results that were available during my care of the patient were reviewed by me and considered in my medical decision making (see chart for details).    Right hip pain, possible radicular in nature with intermittent radiation to anterior thigh and numbness.  She reports some lower back spasm. Flexeril prescribed.  Advised ibuprofen as needed.  Light stretching.  Follow up with PCP if no improvement.  Final Clinical Impressions(s) / UC Diagnoses   Final diagnoses:  Right hip pain     Discharge Instructions      Recommend Ibuprofen as needed Take Flexeril as needed  Recommend ice and gentle stretching, lower back and legs Follow up with Primary Care Physician if no improvement    ED Prescriptions     Medication Sig Dispense Auth. Provider   cyclobenzaprine (FLEXERIL) 5 MG tablet Take 2 tablets (10 mg total) by mouth 3 (three) times daily as needed for muscle spasms. 21 tablet Ward, Lenise Arena, PA-C      PDMP not reviewed this encounter.   Ward, Lenise Arena, PA-C 09/09/21 1710

## 2021-09-19 ENCOUNTER — Emergency Department (HOSPITAL_BASED_OUTPATIENT_CLINIC_OR_DEPARTMENT_OTHER)
Admission: EM | Admit: 2021-09-19 | Discharge: 2021-09-19 | Disposition: A | Discharge status: Home / Self Care | Payer: Medicaid Other | Attending: Emergency Medicine | Admitting: Emergency Medicine

## 2021-09-19 ENCOUNTER — Emergency Department (HOSPITAL_BASED_OUTPATIENT_CLINIC_OR_DEPARTMENT_OTHER): Payer: Medicaid Other

## 2021-09-19 ENCOUNTER — Other Ambulatory Visit: Payer: Self-pay

## 2021-09-19 ENCOUNTER — Encounter (HOSPITAL_BASED_OUTPATIENT_CLINIC_OR_DEPARTMENT_OTHER): Payer: Self-pay | Admitting: *Deleted

## 2021-09-19 DIAGNOSIS — Z79899 Other long term (current) drug therapy: Secondary | ICD-10-CM | POA: Insufficient documentation

## 2021-09-19 DIAGNOSIS — R059 Cough, unspecified: Secondary | ICD-10-CM | POA: Insufficient documentation

## 2021-09-19 DIAGNOSIS — Z20822 Contact with and (suspected) exposure to covid-19: Secondary | ICD-10-CM | POA: Insufficient documentation

## 2021-09-19 LAB — BASIC METABOLIC PANEL
Anion gap: 10 (ref 5–15)
BUN: 6 mg/dL (ref 6–20)
CO2: 21 mmol/L — ABNORMAL LOW (ref 22–32)
Calcium: 8.3 mg/dL — ABNORMAL LOW (ref 8.9–10.3)
Chloride: 104 mmol/L (ref 98–111)
Creatinine, Ser: 0.59 mg/dL (ref 0.44–1.00)
GFR, Estimated: >60 mL/min (ref >60)
Glucose, Bld: 154 mg/dL — ABNORMAL HIGH (ref 70–99)
Potassium: 3.6 mmol/L (ref 3.5–5.1)
Sodium: 135 mmol/L (ref 135–145)

## 2021-09-19 LAB — CBC WITH DIFFERENTIAL/PLATELET
Abs Immature Granulocytes: 0.03 10*3/uL (ref 0.00–0.07)
Basophils Absolute: 0 10*3/uL (ref 0.0–0.1)
Basophils Relative: 0 %
Eosinophils Absolute: 0 10*3/uL (ref 0.0–0.5)
Eosinophils Relative: 0 %
HCT: 34 % — ABNORMAL LOW (ref 36.0–46.0)
Hemoglobin: 11.2 g/dL — ABNORMAL LOW (ref 12.0–15.0)
Immature Granulocytes: 0 %
Lymphocytes Relative: 16 %
Lymphs Abs: 1.3 10*3/uL (ref 0.7–4.0)
MCH: 26.2 pg (ref 26.0–34.0)
MCHC: 32.9 g/dL (ref 30.0–36.0)
MCV: 79.4 fL — ABNORMAL LOW (ref 80.0–100.0)
Monocytes Absolute: 0.5 10*3/uL (ref 0.1–1.0)
Monocytes Relative: 6 %
Neutro Abs: 6.6 10*3/uL (ref 1.7–7.7)
Neutrophils Relative %: 78 %
Platelets: 316 10*3/uL (ref 150–400)
RBC: 4.28 MIL/uL (ref 3.87–5.11)
RDW: 14.8 % (ref 11.5–15.5)
WBC: 8.4 10*3/uL (ref 4.0–10.5)
nRBC: 0 % (ref 0.0–0.2)

## 2021-09-19 LAB — RESP PANEL BY RT-PCR (FLU A&B, COVID) ARPGX2
Influenza A by PCR: NEGATIVE
Influenza B by PCR: NEGATIVE
SARS Coronavirus 2 by RT PCR: NEGATIVE

## 2021-09-19 LAB — TROPONIN I (HIGH SENSITIVITY)
Troponin I (High Sensitivity): <2 ng/L (ref <18)
Troponin I (High Sensitivity): <2 ng/L (ref <18)

## 2021-09-19 MED ORDER — FENTANYL CITRATE PF 50 MCG/ML IJ SOSY
50.0000 ug | PREFILLED_SYRINGE | Freq: Once | INTRAMUSCULAR | Status: AC
  Administered 2021-09-19: 50 ug via INTRAVENOUS
  Filled 2021-09-19: qty 1

## 2021-09-19 NOTE — ED Provider Notes (Signed)
?[facility] EMERGENCY DEPARTMENT ?Provider Note ? ? ?CSN: [id number] ?Arrival date & time: 09/19/21  8466 ? ?  ? ?History ? ?Chief Complaint  ?Patient presents with  ? Cough  ? ? ?Barbara Ortiz is a 31 y.o. female.  Presented to the emergency department with concern for cough.  States that cough has been going on for about 3 months.  Seems to come and go, has previously completed courses of antibiotics and steroids and these seem to help temporarily.  Currently is not on antibiotics.  No fevers or chills.  Has had some associated chest pain, pain worse with coughing spells.  Currently no chest pain right now.  No difficulty breathing, no episodes of passing out. ? ?She denies any major medical problems. ? ?Completed chart review, reviewed recent urgent care visit on 3/02, given Tessalon Perles, Medrol Dosepak ? ?HPI ? ?  ? ?Home Medications ?Prior to Admission medications   ?Medication Sig Start Date End Date Taking? Authorizing Provider  ?cyclobenzaprine (FLEXERIL) 5 MG tablet Take 2 tablets (10 mg total) by mouth 3 (three) times daily as needed for muscle spasms. 09/09/21   Ward, Tylene Fantasia, PA-C  ?dicyclomine (BENTYL) 20 MG tablet Take 1 tablet (20 mg total) by mouth 2 (two) times daily. 05/01/21   Ivette Loyal, NP  ?ferrous sulfate 325 (65 FE) MG tablet Take 1 tablet (325 mg total) by mouth 2 (two) times daily with a meal. 05/15/20   Gustavo Lah, CNM  ?ibuprofen (ADVIL) 600 MG tablet Take 1 tablet (600 mg total) by mouth every 6 (six) hours as needed for mild pain or moderate pain. 05/15/20   Gustavo Lah, CNM  ?Prenatal Vit-DSS-Fe Fum-FA (PRENATAL 19) tablet Take 1 tablet by mouth daily.    [provider]  ?sertraline (ZOLOFT) 50 MG tablet Take 50 mg by mouth at bedtime. 11/27/19 11/26/20  [provider]  ?   ? ?Allergies    ?Copper and Acetaminophen   ? ?Review of Systems   ?Review of Systems  ?Constitutional:  Positive for fatigue. Negative for chills and fever.  ?HENT:   Negative for ear pain and sore throat.   ?Eyes:  Negative for pain and visual disturbance.  ?Respiratory:  Positive for cough. Negative for shortness of breath.   ?Cardiovascular:  Positive for chest pain. Negative for palpitations.  ?Gastrointestinal:  Negative for abdominal pain and vomiting.  ?Genitourinary:  Negative for dysuria and hematuria.  ?Musculoskeletal:  Negative for arthralgias and back pain.  ?Skin:  Negative for color change and rash.  ?Neurological:  Negative for seizures and syncope.  ?All other systems reviewed and are negative. ? ?Physical Exam ?Updated Vital Signs ?BP 119/74   Pulse 68   Temp 97.8 ?F (36.6 ?C) (Oral)   Resp 17   Ht 5\' 3"  (1.6 m)   Wt 89.8 kg   LMP 09/15/2021 (Exact Date)   SpO2 100%   BMI 35.07 kg/m?  ?Physical Exam ?Vitals and nursing note reviewed.  ?Constitutional:   ?   General: She is not in acute distress. ?   Appearance: She is well-developed.  ?HENT:  ?   Head: Normocephalic and atraumatic.  ?Eyes:  ?   Conjunctiva/sclera: Conjunctivae normal.  ?Cardiovascular:  ?   Rate and Rhythm: Normal rate and regular rhythm.  ?   Heart sounds: No murmur heard. ?Pulmonary:  ?   Effort: Pulmonary effort is normal. No respiratory distress.  ?   Breath sounds: Normal breath sounds.  ?  Abdominal:  ?   Palpations: Abdomen is soft.  ?   Tenderness: There is no abdominal tenderness.  ?Musculoskeletal:     ?   General: No swelling.  ?   Cervical back: Neck supple.  ?Skin: ?   General: Skin is warm and dry.  ?   Capillary Refill: Capillary refill takes less than 2 seconds.  ?Neurological:  ?   General: No focal deficit present.  ?   Mental Status: She is alert.  ?Psychiatric:     ?   Mood and Affect: Mood normal.  ? ? ?ED Results / Procedures / Treatments   ?Labs ?(all labs ordered are listed, but only abnormal results are displayed) ?Labs Reviewed  ?CBC WITH DIFFERENTIAL/PLATELET - Abnormal; Notable for the following components:  ?    Result Value  ? Hemoglobin 11.2 (*)   ? HCT 34.0  (*)   ? MCV 79.4 (*)   ? All other components within normal limits  ?BASIC METABOLIC PANEL - Abnormal; Notable for the following components:  ? CO2 21 (*)   ? Glucose, Bld 154 (*)   ? Calcium 8.3 (*)   ? All other components within normal limits  ?RESP PANEL BY RT-PCR (FLU A&B, COVID) [id number]  ?TROPONIN I (HIGH SENSITIVITY)  ?TROPONIN I (HIGH SENSITIVITY)  ? ? ?EKG ?EKG Interpretation ? ?Date/Time:  Monday September 19 2021 07:51:10 EST ?Ventricular Rate:  76 ?PR Interval:  164 ?QRS Duration: 108 ?QT Interval:  386 ?QTC Calculation: 434 ?R Axis:   80 ?Text Interpretation: Normal sinus rhythm with sinus arrhythmia Incomplete right bundle branch block Nonspecific T wave abnormality Abnormal ECG No previous ECGs available Confirmed by Marianna Fuss ([id number]) on 09/19/2021 8:30:47 AM ? ?Radiology ?DG Chest Portable 1 View ? ?Result Date: 09/19/2021 ?CLINICAL DATA:  cough x 3 months EXAM: PORTABLE CHEST 1 VIEW COMPARISON:  None. FINDINGS: No consolidation. No visible pleural effusions or pneumothorax. Borderline enlargement the cardiac silhouette. No acute osseous abnormality. IMPRESSION: 1. No evidence of acute pulmonary disease. 2. Borderline cardiomegaly. Electronically Signed   By: Feliberto Harts M.D.   On: 09/19/2021 09:01   ? ?Procedures ?Procedures  ? ? ?Medications Ordered in ED ?Medications  ?fentaNYL (SUBLIMAZE) injection 50 mcg (50 mcg Intravenous Given 09/19/21 0932)  ? ? ?ED Course/ Medical Decision Making/ A&P ?  ?                        ?Medical Decision Making ?Amount and/or Complexity of Data Reviewed ?Labs: ordered. ?Radiology: ordered. ? ?Risk ?Prescription drug management. ? ? ?31 year old lady presenting for cough, chest pain.  Symptoms ongoing for about 3 months, here well-appearing in no distress.  Her lungs are clear to auscultation, her vital signs are normal.  Her CXR was obtained to evaluate for pneumonia.  I independently reviewed, no focal infiltrate was appreciated.  Basic laboratory work-up was  obtained, no anemia or electrolyte derangement.  EKG with some nonspecific T wave changes however her troponin x2 was undetectable, therefore doubt ACS.  Suspect the chest discomfort more likely MSK in nature and related to her frequent coughing.  Suspect most likely viral upper respiratory illness.  Do not see indication for antibiotics at present.  I advised patient to follow-up with her primary care doctor and discharged home. ? ?After the discussed management above, the patient was determined to be safe for discharge.  The patient was in agreement with this plan and all questions regarding their care were answered.  ED return precautions were discussed and the patient will return to the ED with any significant worsening of condition. ? ? ? ? ? ? ? ? ?Final Clinical Impression(s) / ED Diagnoses ?Final diagnoses:  ?Cough, unspecified type  ? ? ?Rx / DC Orders ?ED Discharge Orders   ? ? None  ? ?  ? ? ?  ?Milagros Loll, MD ?09/20/21 [phone] ? ?

## 2021-09-19 NOTE — Discharge Instructions (Signed)
Follow-up with your primary care doctor to discuss your symptoms from today.  Come back if you develop chest pain, difficulty breathing or other new concerning symptom. ?

## 2021-09-19 NOTE — ED Triage Notes (Signed)
Cough, chest pain x 3 months   has been seen several times for same  w nausea at times ?

## 2021-09-19 NOTE — ED Notes (Signed)
Pt reports cough 3x months, w/ bronchitis dx. Pain to upper mid back today with no relief, unable to sleep. States used muscle relaxer and ibuprofen at home.  ?

## 2021-12-02 ENCOUNTER — Other Ambulatory Visit: Payer: Self-pay

## 2021-12-02 ENCOUNTER — Inpatient Hospital Stay (HOSPITAL_COMMUNITY): Payer: Medicaid Other

## 2021-12-02 ENCOUNTER — Inpatient Hospital Stay (HOSPITAL_COMMUNITY)
Admission: AD | Admit: 2021-12-02 | Discharge: 2021-12-02 | Disposition: A | Discharge status: Home / Self Care | Payer: Medicaid Other | Attending: Obstetrics and Gynecology | Admitting: Obstetrics and Gynecology

## 2021-12-02 ENCOUNTER — Encounter (HOSPITAL_COMMUNITY): Payer: Self-pay | Admitting: Obstetrics and Gynecology

## 2021-12-02 DIAGNOSIS — R1031 Right lower quadrant pain: Secondary | ICD-10-CM | POA: Diagnosis not present

## 2021-12-02 DIAGNOSIS — M545 Low back pain, unspecified: Secondary | ICD-10-CM | POA: Insufficient documentation

## 2021-12-02 DIAGNOSIS — O0941 Supervision of pregnancy with grand multiparity, first trimester: Secondary | ICD-10-CM | POA: Insufficient documentation

## 2021-12-02 DIAGNOSIS — M25551 Pain in right hip: Secondary | ICD-10-CM | POA: Diagnosis not present

## 2021-12-02 DIAGNOSIS — O26891 Other specified pregnancy related conditions, first trimester: Secondary | ICD-10-CM | POA: Insufficient documentation

## 2021-12-02 DIAGNOSIS — M25552 Pain in left hip: Secondary | ICD-10-CM | POA: Diagnosis not present

## 2021-12-02 DIAGNOSIS — O3680X0 Pregnancy with inconclusive fetal viability, not applicable or unspecified: Secondary | ICD-10-CM | POA: Insufficient documentation

## 2021-12-02 DIAGNOSIS — B9689 Other specified bacterial agents as the cause of diseases classified elsewhere: Secondary | ICD-10-CM | POA: Insufficient documentation

## 2021-12-02 DIAGNOSIS — O23591 Infection of other part of genital tract in pregnancy, first trimester: Secondary | ICD-10-CM | POA: Diagnosis not present

## 2021-12-02 DIAGNOSIS — Z3A08 8 weeks gestation of pregnancy: Secondary | ICD-10-CM

## 2021-12-02 DIAGNOSIS — N760 Acute vaginitis: Secondary | ICD-10-CM

## 2021-12-02 HISTORY — DX: Sprain of anterior cruciate ligament of unspecified knee, initial encounter: S83.519A

## 2021-12-02 LAB — COMPREHENSIVE METABOLIC PANEL
ALT: 12 U/L (ref 0–44)
AST: 13 U/L — ABNORMAL LOW (ref 15–41)
Albumin: 3.4 g/dL — ABNORMAL LOW (ref 3.5–5.0)
Alkaline Phosphatase: 49 U/L (ref 38–126)
Anion gap: 5 (ref 5–15)
BUN: <5 mg/dL — ABNORMAL LOW (ref 6–20)
CO2: 25 mmol/L (ref 22–32)
Calcium: 8.9 mg/dL (ref 8.9–10.3)
Chloride: 106 mmol/L (ref 98–111)
Creatinine, Ser: 0.65 mg/dL (ref 0.44–1.00)
GFR, Estimated: >60 mL/min (ref >60)
Glucose, Bld: 96 mg/dL (ref 70–99)
Potassium: 4 mmol/L (ref 3.5–5.1)
Sodium: 136 mmol/L (ref 135–145)
Total Bilirubin: <0.1 mg/dL — ABNORMAL LOW (ref 0.3–1.2)
Total Protein: 7 g/dL (ref 6.5–8.1)

## 2021-12-02 LAB — WET PREP, GENITAL
Sperm: NONE SEEN
Trich, Wet Prep: NONE SEEN
WBC, Wet Prep HPF POC: <10 (ref <10)
Yeast Wet Prep HPF POC: NONE SEEN

## 2021-12-02 LAB — URINALYSIS, ROUTINE W REFLEX MICROSCOPIC
Bilirubin Urine: NEGATIVE
Glucose, UA: NEGATIVE mg/dL
Ketones, ur: NEGATIVE mg/dL
Leukocytes,Ua: NEGATIVE
Nitrite: NEGATIVE
Protein, ur: NEGATIVE mg/dL
Specific Gravity, Urine: 1.006 (ref 1.005–1.030)
pH: 8 (ref 5.0–8.0)

## 2021-12-02 LAB — CBC
HCT: 33.6 % — ABNORMAL LOW (ref 36.0–46.0)
Hemoglobin: 11.1 g/dL — ABNORMAL LOW (ref 12.0–15.0)
MCH: 26.2 pg (ref 26.0–34.0)
MCHC: 33 g/dL (ref 30.0–36.0)
MCV: 79.4 fL — ABNORMAL LOW (ref 80.0–100.0)
Platelets: 318 10*3/uL (ref 150–400)
RBC: 4.23 MIL/uL (ref 3.87–5.11)
RDW: 15.5 % (ref 11.5–15.5)
WBC: 7 10*3/uL (ref 4.0–10.5)
nRBC: 0 % (ref 0.0–0.2)

## 2021-12-02 LAB — HCG, QUANTITATIVE, PREGNANCY: hCG, Beta Chain, Quant, S: 20509 m[IU]/mL — ABNORMAL HIGH (ref <5)

## 2021-12-02 LAB — ABO/RH: ABO/RH(D): O POS

## 2021-12-02 LAB — POCT PREGNANCY, URINE: Preg Test, Ur: POSITIVE — AB

## 2021-12-02 MED ORDER — METRONIDAZOLE 0.75 % VA GEL: 1.0000 | Freq: Two times a day (BID) | VAGINAL | 0 refills | Status: AC

## 2021-12-02 MED ORDER — ACETAMINOPHEN 500 MG PO TABS
1000.0000 mg | ORAL_TABLET | Freq: Once | ORAL | Status: AC
  Administered 2021-12-02: 1000 mg via ORAL
  Filled 2021-12-02: qty 2

## 2021-12-02 MED ORDER — CYCLOBENZAPRINE HCL 5 MG PO TABS
10.0000 mg | ORAL_TABLET | Freq: Once | ORAL | Status: AC
  Administered 2021-12-02: 10 mg via ORAL
  Filled 2021-12-02: qty 2

## 2021-12-02 NOTE — Discharge Instructions (Signed)
You came to the MAU because you had lower abdominal pain. We did Ultrasound which showed that you are likely very early pregnant (around 5 weeks). Since we think the pregnancy is so early we did not see a heartbeat yet. Your pregnancy hormone level was checked and in order to confirm that it is a normal pregnancy you need a repeat blood test in 48 hours. You can come back to MAU to get that checked.  We gave you tylenol and a muscle relaxant (flexeril) and your pain got better  Please seek medical care if your pain comes back and is 10/10, you have vaginal bleeding, or you have feel faint.

## 2021-12-02 NOTE — MAU Note (Signed)
Barbara Ortiz is a 31 y.o. at [redacted]w[redacted]d here in MAU reporting: back pain started this morning and states it has gotten worse. Also having right lower abdominal pain. Denies bleeding or discharge.  LMP: 10/06/21  Onset of complaint: today  Pain score: right lower abdomen 8/10, back 7/10  Vitals:   12/02/21 1807  BP: 136/71  Pulse: 78  Resp: 16  Temp: 97.9 F (36.6 C)  SpO2: 100%     Lab orders placed from triage: ua, upt

## 2021-12-02 NOTE — MAU Provider Note (Incomplete Revision)
History     CSN: [id number]  Arrival date and time: 12/02/21 1745   Event Date/Time   First Provider Initiated Contact with Patient 12/02/21 1853      Chief Complaint  Patient presents with   Abdominal Pain   Back Pain   Barbara Ortiz is a 31 y.o. [id number] at 4564w1d by Definite LMP of October 06, 2021.  She presents today for Abdominal Pain and Back Pain.  She reports the pain started "earlier today around 9ish."  Patient reports the pain is constant in her hips, but the back is intermittent.  She describes the hip pain as "like the day before your menstrual" and confirms cramping sensation.  She states her back pain is best described as a "stabbing" pain in the middle of her lower back.  She reports improvement with laying down and denies aggravating symptoms.  She rates the back pain a 7/10 and the abdominal/hip pain a 8/10. She denies recent sexual activity.    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 with breastfeeding   Bronchitis    SAB (spontaneous abortion) 12/28/2015    Past Surgical History:  Procedure Laterality Date   CESAREAN SECTION     CESAREAN SECTION  05/13/2020   Procedure: CESAREAN SECTION;  Surgeon: Schermerhorn, Ihor Austinhomas J, MD;  Location: [facility] ORS;  Service: Obstetrics;;   DILATION AND CURETTAGE OF UTERUS      Family History  Problem Relation Age of Onset   Hypertension Mother    Diabetes Maternal Grandmother    Hypertension Maternal Grandmother    Diabetes Maternal Grandfather    Hypertension Maternal Grandfather    Cancer Paternal Grandmother     Social History   Tobacco Use   Smoking status: Never    Smokeless tobacco: Never  Vaping Use   Vaping Use: Never used  Substance Use Topics   Alcohol use: Not Currently    Alcohol/week: 0.0 standard drinks   Drug use: No    Allergies:  Allergies  Allergen Reactions   Copper Swelling   Acetaminophen Other (See Comments)    Liver function    Medications Prior to Admission  Medication Sig Dispense Refill Last Dose   cyclobenzaprine (FLEXERIL) 5 MG tablet Take 2 tablets (10 mg total) by mouth 3 (three) times daily as needed for muscle spasms. 21 tablet 0    dicyclomine (BENTYL) 20 MG tablet Take 1 tablet (20 mg total) by mouth 2 (two) times daily. 20 tablet 0    ferrous sulfate 325 (65 FE) MG tablet Take 1 tablet (325 mg total) by mouth 2 (two) times daily with a meal.  3    ibuprofen (ADVIL) 600 MG tablet Take 1 tablet (600 mg total) by mouth every 6 (six) hours as needed for mild pain or moderate pain. 60 tablet 1    Prenatal Vit-DSS-Fe Fum-FA (PRENATAL 19) tablet  Take 1 tablet by mouth daily.      sertraline (ZOLOFT) 50 MG tablet Take 50 mg by mouth at bedtime.       Review of Systems  Gastrointestinal:  Positive for abdominal pain (Lower right side). Negative for constipation, diarrhea, nausea and vomiting.  Genitourinary:  Negative for difficulty urinating, dyspareunia, dysuria, pelvic pain, vaginal bleeding and vaginal discharge.  Musculoskeletal:  Positive for back pain.  Neurological:  Positive for headaches (Frontal). Negative for dizziness and light-headedness.  Physical Exam   Blood pressure 136/71, pulse 78, temperature 97.9 F (36.6 C), temperature source Oral, resp. rate 16, height 5\' 4"  (1.626 m), weight 95.1 kg, last menstrual period 10/06/2021, SpO2 100 %, unknown if currently breastfeeding.  Physical Exam Vitals reviewed.  Constitutional:      Appearance: Normal appearance. She is well-developed.  HENT:     Head: Normocephalic and atraumatic.  Eyes:     Conjunctiva/sclera: Conjunctivae normal.  Cardiovascular:      Rate and Rhythm: Normal rate.     Heart sounds: Normal heart sounds.  Pulmonary:     Effort: Pulmonary effort is normal. No respiratory distress.     Breath sounds: Normal breath sounds.  Abdominal:     General: Bowel sounds are normal.     Palpations: Abdomen is soft.     Tenderness: There is abdominal tenderness in the right lower quadrant.  Musculoskeletal:        General: Normal range of motion.     Cervical back: Normal range of motion.  Skin:    General: Skin is warm and dry.  Neurological:     Mental Status: She is alert and oriented to person, place, and time.  Psychiatric:        Mood and Affect: Mood normal.        Behavior: Behavior normal.    MAU Course  Procedures Results for orders placed or performed during the hospital encounter of 12/02/21 (from the past 24 hour(s))  Pregnancy, urine POC     Status: Abnormal   Collection Time: 12/02/21  5:59 PM  Result Value Ref Range   Preg Test, Ur POSITIVE (A) NEGATIVE  Urinalysis, Routine w reflex microscopic Urine, Clean Catch     Status: Abnormal   Collection Time: 12/02/21  6:05 PM  Result Value Ref Range   Color, Urine STRAW (A) YELLOW   APPearance HAZY (A) CLEAR   Specific Gravity, Urine 1.006 1.005 - 1.030   pH 8.0 5.0 - 8.0   Glucose, UA NEGATIVE NEGATIVE mg/dL   Hgb urine dipstick SMALL (A) NEGATIVE   Bilirubin Urine NEGATIVE NEGATIVE   Ketones, ur NEGATIVE NEGATIVE mg/dL   Protein, ur NEGATIVE NEGATIVE mg/dL   Nitrite NEGATIVE NEGATIVE   Leukocytes,Ua NEGATIVE NEGATIVE   RBC / HPF 0-5 0 - 5 RBC/hpf   WBC, UA 0-5 0 - 5 WBC/hpf   Bacteria, UA RARE (A) NONE SEEN   Squamous Epithelial / LPF 6-10 0 - 5   Mucus PRESENT     MDM Pelvic Exam; Wet Prep and GC/CT Labs: UA, UPT, CBC, hCG, ABO Ultrasound  10:04 PM Feels better and pain resolved. 12/04/21 results back confirmed IUP (gestational sac, no YS, no fetal pole, and no cardiac activity). Suspect early pregnancy measuring 5wk.   Labs showed normal Cr, LFTs  not elevated. Hcg 20509. Blood type O+ Assessment and Plan  31 year old, 33  SIUP at 8.1 weeks Back Pain RLQ Pain  -Reviewed POC with patient. -Exam performed.  -  Patient offered and accepts pain medication. -Flexeril and tylenol ordered. Patient confirms that she is able to take tylenol, but does so in moderation.  -Patient offered and accepts heating pad. -Labs ordered. -Patient to collect cultures by self-swab. -[place] ordered. Will send and await results.    Cherre Robins 12/02/2021, 6:53 PM   Reassessment (7:57 PM) Report given to A. Ephriam Jenkins, MD for assumption of care.

## 2021-12-02 NOTE — MAU Provider Note (Signed)
History     CSN: [id number]  Arrival date and time: 12/02/21 1745   Event Date/Time   First Provider Initiated Contact with Patient 12/02/21 1853      Chief Complaint  Patient presents with   Abdominal Pain   Back Pain   Barbara Ortiz is a 31 y.o. [id number] at 7910w1d by Definite LMP of October 06, 2021.  She presents today for Abdominal Pain and Back Pain.  She reports the pain started "earlier today around 9ish."  Patient reports the pain is constant in her hips, but the back is intermittent.  She describes the hip pain as "like the day before your menstrual" and confirms cramping sensation.  She states her back pain is best described as a "stabbing" pain in the middle of her lower back.  She reports improvement with laying down and denies aggravating symptoms.  She rates the back pain a 7/10 and the abdominal/hip pain a 8/10. She denies recent sexual activity.    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 with breastfeeding   Bronchitis    SAB (spontaneous abortion) 12/28/2015    Past Surgical History:  Procedure Laterality Date   CESAREAN SECTION     CESAREAN SECTION  05/13/2020   Procedure: CESAREAN SECTION;  Surgeon: Schermerhorn, Ihor Austinhomas J, MD;  Location: [facility] ORS;  Service: Obstetrics;;   DILATION AND CURETTAGE OF UTERUS      Family History  Problem Relation Age of Onset   Hypertension Mother    Diabetes Maternal Grandmother    Hypertension Maternal Grandmother    Diabetes Maternal Grandfather    Hypertension Maternal Grandfather    Cancer Paternal Grandmother     Social History   Tobacco Use   Smoking status: Never    Smokeless tobacco: Never  Vaping Use   Vaping Use: Never used  Substance Use Topics   Alcohol use: Not Currently    Alcohol/week: 0.0 standard drinks   Drug use: No    Allergies:  Allergies  Allergen Reactions   Copper Swelling   Acetaminophen Other (See Comments)    Liver function    Medications Prior to Admission  Medication Sig Dispense Refill Last Dose   cyclobenzaprine (FLEXERIL) 5 MG tablet Take 2 tablets (10 mg total) by mouth 3 (three) times daily as needed for muscle spasms. 21 tablet 0    dicyclomine (BENTYL) 20 MG tablet Take 1 tablet (20 mg total) by mouth 2 (two) times daily. 20 tablet 0    ferrous sulfate 325 (65 FE) MG tablet Take 1 tablet (325 mg total) by mouth 2 (two) times daily with a meal.  3    ibuprofen (ADVIL) 600 MG tablet Take 1 tablet (600 mg total) by mouth every 6 (six) hours as needed for mild pain or moderate pain. 60 tablet 1    Prenatal Vit-DSS-Fe Fum-FA (PRENATAL 19) tablet  Take 1 tablet by mouth daily.      sertraline (ZOLOFT) 50 MG tablet Take 50 mg by mouth at bedtime.       Review of Systems  Gastrointestinal:  Positive for abdominal pain (Lower right side). Negative for constipation, diarrhea, nausea and vomiting.  Genitourinary:  Negative for difficulty urinating, dyspareunia, dysuria, pelvic pain, vaginal bleeding and vaginal discharge.  Musculoskeletal:  Positive for back pain.  Neurological:  Positive for headaches (Frontal). Negative for dizziness and light-headedness.  Physical Exam   Blood pressure 136/71, pulse 78, temperature 97.9 F (36.6 C), temperature source Oral, resp. rate 16, height 5\' 4"  (1.626 m), weight 95.1 kg, last menstrual period 10/06/2021, SpO2 100 %, unknown if currently breastfeeding.  Physical Exam Vitals reviewed.  Constitutional:      Appearance: Normal appearance. She is well-developed.  HENT:     Head: Normocephalic and atraumatic.  Eyes:     Conjunctiva/sclera: Conjunctivae normal.  Cardiovascular:      Rate and Rhythm: Normal rate.     Heart sounds: Normal heart sounds.  Pulmonary:     Effort: Pulmonary effort is normal. No respiratory distress.     Breath sounds: Normal breath sounds.  Abdominal:     General: Bowel sounds are normal.     Palpations: Abdomen is soft.     Tenderness: There is abdominal tenderness in the right lower quadrant.  Musculoskeletal:        General: Normal range of motion.     Cervical back: Normal range of motion.  Skin:    General: Skin is warm and dry.  Neurological:     Mental Status: She is alert and oriented to person, place, and time.  Psychiatric:        Mood and Affect: Mood normal.        Behavior: Behavior normal.    MAU Course  Procedures Results for orders placed or performed during the hospital encounter of 12/02/21 (from the past 24 hour(s))  Pregnancy, urine POC     Status: Abnormal   Collection Time: 12/02/21  5:59 PM  Result Value Ref Range   Preg Test, Ur POSITIVE (A) NEGATIVE  Urinalysis, Routine w reflex microscopic Urine, Clean Catch     Status: Abnormal   Collection Time: 12/02/21  6:05 PM  Result Value Ref Range   Color, Urine STRAW (A) YELLOW   APPearance HAZY (A) CLEAR   Specific Gravity, Urine 1.006 1.005 - 1.030   pH 8.0 5.0 - 8.0   Glucose, UA NEGATIVE NEGATIVE mg/dL   Hgb urine dipstick SMALL (A) NEGATIVE   Bilirubin Urine NEGATIVE NEGATIVE   Ketones, ur NEGATIVE NEGATIVE mg/dL   Protein, ur NEGATIVE NEGATIVE mg/dL   Nitrite NEGATIVE NEGATIVE   Leukocytes,Ua NEGATIVE NEGATIVE   RBC / HPF 0-5 0 - 5 RBC/hpf   WBC, UA 0-5 0 - 5 WBC/hpf   Bacteria, UA RARE (A) NONE SEEN   Squamous Epithelial / LPF 6-10 0 - 5   Mucus PRESENT     MDM Pelvic Exam; Wet Prep and GC/CT Labs: UA, UPT, CBC, hCG, ABO Ultrasound Assessment and Plan  31 year old, 33  SIUP at 8.1 weeks Back Pain RLQ Pain  -Reviewed POC with patient. -Exam performed.  -Patient offered and accepts pain medication. -Flexeril and tylenol  ordered. Patient confirms that she is able to take tylenol, but does so in moderation.  -Patient offered and accepts heating pad. -Labs ordered. -Patient to collect cultures by self-swab. -[id number] ordered. Will send  and await results.    Cherre Robins 12/02/2021, 6:53 PM   Reassessment (7:57 PM) Report given to A. Ephriam Jenkins, MD for assumption of care.

## 2021-12-04 ENCOUNTER — Inpatient Hospital Stay (HOSPITAL_COMMUNITY): Payer: Medicaid Other | Admitting: Registered Nurse

## 2021-12-04 ENCOUNTER — Inpatient Hospital Stay (HOSPITAL_COMMUNITY): Payer: Medicaid Other

## 2021-12-04 ENCOUNTER — Encounter (HOSPITAL_COMMUNITY): Payer: Self-pay | Admitting: Obstetrics and Gynecology

## 2021-12-04 ENCOUNTER — Encounter (HOSPITAL_COMMUNITY)
Admission: AD | Disposition: A | Discharge status: Home / Self Care | Payer: Self-pay | Source: Home / Self Care | Attending: Obstetrics and Gynecology

## 2021-12-04 ENCOUNTER — Ambulatory Visit (HOSPITAL_COMMUNITY)
Admission: AD | Admit: 2021-12-04 | Discharge: 2021-12-04 | Disposition: A | Discharge status: Home / Self Care | Payer: Medicaid Other | Attending: Obstetrics and Gynecology | Admitting: Obstetrics and Gynecology

## 2021-12-04 ENCOUNTER — Other Ambulatory Visit: Payer: Self-pay

## 2021-12-04 ENCOUNTER — Inpatient Hospital Stay (HOSPITAL_BASED_OUTPATIENT_CLINIC_OR_DEPARTMENT_OTHER): Payer: Medicaid Other | Admitting: Registered Nurse

## 2021-12-04 ENCOUNTER — Inpatient Hospital Stay (HOSPITAL_COMMUNITY)
Admit: 2021-12-04 | Discharge: 2021-12-04 | Disposition: A | Discharge status: Home / Self Care | Payer: Medicaid Other | Attending: Obstetrics & Gynecology | Admitting: Obstetrics & Gynecology

## 2021-12-04 DIAGNOSIS — Z6835 Body mass index (BMI) 35.0-35.9, adult: Secondary | ICD-10-CM | POA: Insufficient documentation

## 2021-12-04 DIAGNOSIS — O26899 Other specified pregnancy related conditions, unspecified trimester: Secondary | ICD-10-CM | POA: Insufficient documentation

## 2021-12-04 DIAGNOSIS — Z3A00 Weeks of gestation of pregnancy not specified: Secondary | ICD-10-CM | POA: Insufficient documentation

## 2021-12-04 DIAGNOSIS — Z9889 Other specified postprocedural states: Secondary | ICD-10-CM

## 2021-12-04 DIAGNOSIS — J45909 Unspecified asthma, uncomplicated: Secondary | ICD-10-CM | POA: Insufficient documentation

## 2021-12-04 DIAGNOSIS — O26851 Spotting complicating pregnancy, first trimester: Secondary | ICD-10-CM

## 2021-12-04 DIAGNOSIS — R109 Unspecified abdominal pain: Secondary | ICD-10-CM

## 2021-12-04 DIAGNOSIS — O021 Missed abortion: Secondary | ICD-10-CM | POA: Diagnosis not present

## 2021-12-04 DIAGNOSIS — O26891 Other specified pregnancy related conditions, first trimester: Secondary | ICD-10-CM

## 2021-12-04 DIAGNOSIS — O3680X0 Pregnancy with inconclusive fetal viability, not applicable or unspecified: Secondary | ICD-10-CM

## 2021-12-04 HISTORY — PX: DILATION AND EVACUATION: SHX1459

## 2021-12-04 LAB — CBC
HCT: 35.2 % — ABNORMAL LOW (ref 36.0–46.0)
Hemoglobin: 11.6 g/dL — ABNORMAL LOW (ref 12.0–15.0)
MCH: 26 pg (ref 26.0–34.0)
MCHC: 33 g/dL (ref 30.0–36.0)
MCV: 78.7 fL — ABNORMAL LOW (ref 80.0–100.0)
Platelets: 318 10*3/uL (ref 150–400)
RBC: 4.47 MIL/uL (ref 3.87–5.11)
RDW: 15.4 % (ref 11.5–15.5)
WBC: 7.7 10*3/uL (ref 4.0–10.5)
nRBC: 0 % (ref 0.0–0.2)

## 2021-12-04 LAB — TYPE AND SCREEN
ABO/RH(D): O POS
Antibody Screen: NEGATIVE

## 2021-12-04 LAB — HCG, QUANTITATIVE, PREGNANCY: hCG, Beta Chain, Quant, S: 20880 m[IU]/mL — ABNORMAL HIGH (ref <5)

## 2021-12-04 SURGERY — DILATION AND EVACUATION, UTERUS
Anesthesia: General

## 2021-12-04 MED ORDER — IBUPROFEN 600 MG PO TABS: 600.0000 mg | ORAL_TABLET | Freq: Four times a day (QID) | ORAL | 0 refills | Status: DC | PRN

## 2021-12-04 MED ORDER — DEXAMETHASONE SODIUM PHOSPHATE 10 MG/ML IJ SOLN
INTRAMUSCULAR | Status: AC
  Filled 2021-12-04: qty 1

## 2021-12-04 MED ORDER — LIDOCAINE HCL (PF) 1 % IJ SOLN
INTRAMUSCULAR | Status: AC
  Filled 2021-12-04: qty 30

## 2021-12-04 MED ORDER — MIDAZOLAM HCL 2 MG/2ML IJ SOLN
INTRAMUSCULAR | Status: AC
  Filled 2021-12-04: qty 2

## 2021-12-04 MED ORDER — LACTATED RINGERS IV SOLN: INTRAVENOUS | Status: DC

## 2021-12-04 MED ORDER — DEXAMETHASONE SODIUM PHOSPHATE 10 MG/ML IJ SOLN
INTRAMUSCULAR | Status: DC | PRN
  Administered 2021-12-04: 5 mg via INTRAVENOUS

## 2021-12-04 MED ORDER — HYDROMORPHONE HCL 1 MG/ML IJ SOLN: 0.2500 mg | INTRAMUSCULAR | Status: DC | PRN

## 2021-12-04 MED ORDER — ALBUTEROL SULFATE HFA 108 (90 BASE) MCG/ACT IN AERS
INHALATION_SPRAY | RESPIRATORY_TRACT | Status: DC | PRN
  Administered 2021-12-04: 2 via RESPIRATORY_TRACT

## 2021-12-04 MED ORDER — SUCCINYLCHOLINE CHLORIDE 200 MG/10ML IV SOSY
PREFILLED_SYRINGE | INTRAVENOUS | Status: DC | PRN
  Administered 2021-12-04: 120 mg via INTRAVENOUS

## 2021-12-04 MED ORDER — ONDANSETRON HCL 4 MG/2ML IJ SOLN
INTRAMUSCULAR | Status: AC
  Filled 2021-12-04: qty 2

## 2021-12-04 MED ORDER — CHLORHEXIDINE GLUCONATE 0.12 % MT SOLN: 15.0000 mL | Freq: Once | OROMUCOSAL | Status: DC

## 2021-12-04 MED ORDER — ONDANSETRON HCL 4 MG/2ML IJ SOLN
INTRAMUSCULAR | Status: DC | PRN
  Administered 2021-12-04: 4 mg via INTRAVENOUS

## 2021-12-04 MED ORDER — LIDOCAINE 2% (20 MG/ML) 5 ML SYRINGE
INTRAMUSCULAR | Status: DC | PRN
  Administered 2021-12-04: 60 mg via INTRAVENOUS

## 2021-12-04 MED ORDER — DOXYCYCLINE HYCLATE 100 MG IV SOLR
200.0000 mg | INTRAVENOUS | Status: AC
  Administered 2021-12-04: 200 mg via INTRAVENOUS
  Filled 2021-12-04: qty 200

## 2021-12-04 MED ORDER — ROCURONIUM BROMIDE 10 MG/ML (PF) SYRINGE
PREFILLED_SYRINGE | INTRAVENOUS | Status: AC
  Filled 2021-12-04: qty 10

## 2021-12-04 MED ORDER — FENTANYL CITRATE (PF) 250 MCG/5ML IJ SOLN
INTRAMUSCULAR | Status: DC | PRN
  Administered 2021-12-04: 100 ug via INTRAVENOUS
  Administered 2021-12-04: 50 ug via INTRAVENOUS

## 2021-12-04 MED ORDER — FENTANYL CITRATE (PF) 250 MCG/5ML IJ SOLN
INTRAMUSCULAR | Status: AC
  Filled 2021-12-04: qty 5

## 2021-12-04 MED ORDER — PROPOFOL 1000 MG/100ML IV EMUL
INTRAVENOUS | Status: AC
  Filled 2021-12-04: qty 100

## 2021-12-04 MED ORDER — PROPOFOL 10 MG/ML IV BOLUS
INTRAVENOUS | Status: AC
  Filled 2021-12-04: qty 20

## 2021-12-04 MED ORDER — MIDAZOLAM HCL 2 MG/2ML IJ SOLN
INTRAMUSCULAR | Status: DC | PRN
  Administered 2021-12-04: 2 mg via INTRAVENOUS

## 2021-12-04 MED ORDER — KETOROLAC TROMETHAMINE 30 MG/ML IJ SOLN
INTRAMUSCULAR | Status: DC | PRN
  Administered 2021-12-04: 30 mg via INTRAVENOUS

## 2021-12-04 MED ORDER — LIDOCAINE HCL 1 % IJ SOLN
INTRAMUSCULAR | Status: DC | PRN
  Administered 2021-12-04: 10 mL

## 2021-12-04 MED ORDER — DIPHENHYDRAMINE HCL 50 MG/ML IJ SOLN
INTRAMUSCULAR | Status: DC | PRN
  Administered 2021-12-04: 12.5 mg via INTRAVENOUS

## 2021-12-04 MED ORDER — PROPOFOL 10 MG/ML IV BOLUS
INTRAVENOUS | Status: DC | PRN
Start: 2021-12-04 — End: 2021-12-04
  Administered 2021-12-04: 150 mg via INTRAVENOUS

## 2021-12-04 MED ORDER — ORAL CARE MOUTH RINSE: 15.0000 mL | Freq: Once | OROMUCOSAL | Status: DC

## 2021-12-04 MED ORDER — SODIUM CHLORIDE 0.9 % IV SOLN: INTRAVENOUS | Status: DC

## 2021-12-04 MED ORDER — LIDOCAINE 2% (20 MG/ML) 5 ML SYRINGE
INTRAMUSCULAR | Status: AC
  Filled 2021-12-04: qty 5

## 2021-12-04 SURGICAL SUPPLY — 34 items
BNDG ELASTIC 6X5.8 VLCR STR LF (GAUZE/BANDAGES/DRESSINGS) ×1 IMPLANT
CATH ROBINSON RED A/P 16FR (CATHETERS) ×1 IMPLANT
DECANTER SPIKE VIAL GLASS SM (MISCELLANEOUS) ×2 IMPLANT
FILTER UTR ASPR ASSEMBLY (MISCELLANEOUS) ×2 IMPLANT
GAUZE 4X4 16PLY ~~LOC~~+RFID DBL (SPONGE) ×1 IMPLANT
GLOVE BIOGEL PI IND STRL 7.0 (GLOVE) ×1 IMPLANT
GLOVE BIOGEL PI IND STRL 7.5 (GLOVE) ×1 IMPLANT
GLOVE BIOGEL PI INDICATOR 7.0 (GLOVE) ×2
GLOVE BIOGEL PI INDICATOR 7.5 (GLOVE) ×2
GLOVE NEODERM STER SZ 7 (GLOVE) ×2 IMPLANT
GOWN STRL REUS W/ TWL LRG LVL3 (GOWN DISPOSABLE) ×1 IMPLANT
GOWN STRL REUS W/ TWL XL LVL3 (GOWN DISPOSABLE) ×1 IMPLANT
GOWN STRL REUS W/TWL LRG LVL3 (GOWN DISPOSABLE) ×1
GOWN STRL REUS W/TWL XL LVL3 (GOWN DISPOSABLE) ×1
HOSE CONNECTING 18IN BERKELEY (TUBING) ×2 IMPLANT
KIT BERKELEY 1ST TRI 3/8 NO TR (MISCELLANEOUS) ×2 IMPLANT
KIT BERKELEY 1ST TRIMESTER 3/8 (MISCELLANEOUS) ×2 IMPLANT
NS IRRIG 1000ML POUR BTL (IV SOLUTION) ×2 IMPLANT
PACK LAPAROSCOPY BASIN (CUSTOM PROCEDURE TRAY) ×1 IMPLANT
PACK VAGINAL MINOR WOMEN LF (CUSTOM PROCEDURE TRAY) ×2 IMPLANT
PAD OB MATERNITY 4.3X12.25 (PERSONAL CARE ITEMS) ×2 IMPLANT
SET BERKELEY SUCTION TUBING (SUCTIONS) ×3 IMPLANT
SET TUBE SMOKE EVAC HIGH FLOW (TUBING) ×1 IMPLANT
SHEARS HARMONIC ACE PLUS 36CM (ENDOMECHANICALS) ×1 IMPLANT
SYR CONTROL 10ML LL (SYRINGE) ×1 IMPLANT
TOWEL GREEN STERILE FF (TOWEL DISPOSABLE) ×4 IMPLANT
TRAY LAPAROSCOPIC (CUSTOM PROCEDURE TRAY) ×1 IMPLANT
TROCAR BALLN 12MMX100 BLUNT (TROCAR) ×1 IMPLANT
TROCAR XCEL NON-BLD 5MMX100MML (ENDOMECHANICALS) ×1 IMPLANT
VACURETTE 10 RIGID CVD (CANNULA) ×1 IMPLANT
VACURETTE 7MM CVD STRL WRAP (CANNULA) IMPLANT
VACURETTE 8 RIGID CVD (CANNULA) IMPLANT
VACURETTE 9 RIGID CVD (CANNULA) ×1 IMPLANT
WARMER LAPAROSCOPE (MISCELLANEOUS) ×1 IMPLANT

## 2021-12-04 NOTE — MAU Provider Note (Signed)
MAU Provider Note  I d/w her and her partner that she definitely has an abnormal pregnancy (hcg today is 20.880 and was 20,509 on 5/19) and her u/s today doesn't show an IUP either, as it did not on 5/19; there is also no abnormal findings to indicate adnexal/ectopic pregnancy either. I told her that we have to assume it's an abnormal pregnancy that is an ectopic. I told her it's probably a failed IUP based on the u/s findings and that I recommend a suction d&c with possible laparoscopy to remove anything concerning for an ectopic which could be tube or ovary.   She is amenable to this. NPO since 0800 and I d/w Dr. Luisa Hart of pathology re: doing frozen.  OR called and plan for 1730. Cbc and t&s ordered. I d/w them re: increased risk if needs laparoscopy given her h/o two c-sections but if all goes well, which I anticipate, then will plan for d/c from the PACU  Cornelia Copa MD Attending Center for [facility] (Faculty Practice) 12/04/2021 Time: [phone]

## 2021-12-04 NOTE — MAU Note (Signed)
Barbara Ortiz is a 31 y.o. at [redacted]w[redacted]d here in MAU reporting: here for HCG level.  Denies VB, had spotting with wiping yesterday.  Endorses lower abdominal cramping & back pain.  Took Tylenol earlier today.  Onset of complaint: 2 days ago Pain score: 8/10 cramping & 6/10 back pain Vitals:   12/04/21 1130  BP: 128/72  Pulse: 77  Resp: 18  Temp: 98.6 F (37 C)  SpO2: 100%     FHT:N/A Lab orders placed from triage:   HCG

## 2021-12-04 NOTE — Anesthesia Postprocedure Evaluation (Signed)
Anesthesia Post Note  Patient: Barbara Ortiz  Procedure(s) Performed: DILATATION AND EVACUATION     Patient location during evaluation: PACU Anesthesia Type: General Level of consciousness: awake and alert Pain management: pain level controlled Vital Signs Assessment: post-procedure vital signs reviewed and stable Respiratory status: spontaneous breathing, nonlabored ventilation and respiratory function stable Cardiovascular status: blood pressure returned to baseline and stable Postop Assessment: no apparent nausea or vomiting Anesthetic complications: no   No notable events documented.  Last Vitals:  Vitals:   12/04/21 2030 12/04/21 2045  BP: 138/74 133/75  Pulse: 86 80  Resp: 20 20  Temp:  36.6 C  SpO2: 96% 98%    Last Pain:  Vitals:   12/04/21 2045  TempSrc:   PainSc: 0-No pain                 Deaja Rizo,W. EDMOND

## 2021-12-04 NOTE — Transfer of Care (Signed)
Immediate Anesthesia Transfer of Care Note  Patient: Barbara Ortiz  Procedure(s) Performed: DILATATION AND EVACUATION  Patient Location: PACU  Anesthesia Type:General  Level of Consciousness: awake, alert , oriented and drowsy  Airway & Oxygen Therapy: Patient Spontanous Breathing and Patient connected to nasal cannula oxygen  Post-op Assessment: Report given to RN and Post -op Vital signs reviewed and stable  Post vital signs: Reviewed and stable  Last Vitals:  Vitals Value Taken Time  BP 135/86 12/04/21 2015  Temp    Pulse 97 12/04/21 2017  Resp 23 12/04/21 2017  SpO2 97 % 12/04/21 2017  Vitals shown include unvalidated device data.  Last Pain:  Vitals:   12/04/21 1733  TempSrc: Oral  PainSc:          Complications: No notable events documented.

## 2021-12-04 NOTE — Anesthesia Preprocedure Evaluation (Addendum)
Anesthesia Evaluation  Patient identified by MRN, date of birth, ID band Patient awake    Reviewed: Allergy & Precautions, H&P , NPO status , Patient's Chart, lab work & pertinent test results  Airway Mallampati: II  TM Distance: >3 FB Neck ROM: Full    Dental no notable dental hx. (+) Teeth Intact, Dental Advisory Given   Pulmonary asthma ,    Pulmonary exam normal breath sounds clear to auscultation       Cardiovascular negative cardio ROS   Rhythm:Regular Rate:Normal     Neuro/Psych Anxiety negative neurological ROS  negative psych ROS   GI/Hepatic negative GI ROS, Neg liver ROS,   Endo/Other  Morbid obesity  Renal/GU negative Renal ROS  negative genitourinary   Musculoskeletal   Abdominal   Peds  Hematology  (+) Blood dyscrasia, anemia ,   Anesthesia Other Findings   Reproductive/Obstetrics negative OB ROS                            Anesthesia Physical Anesthesia Plan  ASA: 2  Anesthesia Plan: General   Post-op Pain Management: Ofirmev IV (intra-op)* and Toradol IV (intra-op)*   Induction: Intravenous, Rapid sequence and Cricoid pressure planned  PONV Risk Score and Plan: 4 or greater and Ondansetron, Dexamethasone and Midazolam  Airway Management Planned: Oral ETT  Additional Equipment:   Intra-op Plan:   Post-operative Plan: Extubation in OR  Informed Consent: I have reviewed the patients History and Physical, chart, labs and discussed the procedure including the risks, benefits and alternatives for the proposed anesthesia with the patient or authorized representative who has indicated his/her understanding and acceptance.     Dental advisory given  Plan Discussed with: CRNA  Anesthesia Plan Comments:         Anesthesia Quick Evaluation

## 2021-12-04 NOTE — Op Note (Signed)
Operative Note   12/04/2021  PRE-OP DIAGNOSIS: Abnormal pregnancy of unknown location   POST-OP DIAGNOSIS: Same.   SURGEON: Surgeon(s) and Role:    * Tasia Liz, Eduard Clos, MD - Primary  ASSISTANT: None  PROCEDURE:  Suction dilation and curettage  ANESTHESIA: General and paracervical block  ESTIMATED BLOOD LOSS: 48mL  DRAINS: None  TOTAL IV FLUIDS: per anesthesia note  SPECIMENS: products of conception to pathology  VTE PROPHYLAXIS: SCDs to the bilateral lower extremities  ANTIBIOTICS: Doxycycline 200mg  IV x 1 pre op  COMPLICATIONS: none  DISPOSITION: PACU - hemodynamically stable.  CONDITION: stable  BLOOD TYPE: O POS. Rhogam given:not applicable  FROZEN: Per Dr. Saralyn Pilar, +villi on frozen  FINDINGS: Exam under anesthesia revealed 8 week sized uterus with no masses and bilateral adnexa without masses or fullness. Necrotic appearing products of conception were seen, with gritty texture in all four quadrants.  PROCEDURE IN DETAIL:  After informed consent was obtained, the patient was taken to the operating room where anesthesia was obtained without difficulty. The patient was positioned in the dorsal lithotomy position in [place]. The patient was examined under anesthesia, with the above noted findings.  The bi-valved speculum was placed inside the patient's vagina, and the the anterior lip of the cervix was seen and grasped with the tenaculum.  A paracervical block was achieved with 24mL of 1% lidocaine and then the cervix was already dilated to pass a #10 cannula. The suction was then calibrated to 76mmHg and connected to the number 10 cannula, which was then introduced with the above noted findings. A gentle curettage was done at the end and yield no products of conception.   The suction was then done one more time to remove any remaining curettage material.  Excellent hemostasis was noted, and all instruments were removed, with excellent hemostasis noted throughout.   Frozen pathology with villi. She was then taken out of dorsal lithotomy. The patient tolerated the procedure well.  Sponge, lap and instrument counts were correct x2.  The patient was taken to recovery room in excellent condition.  Durene Romans MD Attending Center for [employer] manager)

## 2021-12-04 NOTE — Anesthesia Procedure Notes (Signed)
Procedure Name: Intubation Date/Time: 12/04/2021 6:30 PM Performed by: Trinna Post., CRNA Pre-anesthesia Checklist: Patient identified, Emergency Drugs available, Suction available, Patient being monitored and Timeout performed Patient Re-evaluated:Patient Re-evaluated prior to induction Oxygen Delivery Method: Circle system utilized Preoxygenation: Pre-oxygenation with 100% oxygen Induction Type: IV induction, Rapid sequence and Cricoid Pressure applied Laryngoscope Size: Mac and 4 Grade View: Grade I Tube type: Oral Tube size: 7.0 mm Number of attempts: 1 Airway Equipment and Method: Stylet Placement Confirmation: positive ETCO2, ETT inserted through vocal cords under direct vision and breath sounds checked- equal and bilateral Secured at: 23 cm Tube secured with: Tape Dental Injury: Teeth and Oropharynx as per pre-operative assessment

## 2021-12-04 NOTE — Discharge Instructions (Addendum)
   We will discuss your surgery once again in detail at your post-op visit in two to four weeks. If you haven't already done so, please call to make your appointment as soon as possible.  Dilation and Curettage or Vacuum Curettage, Care After These instructions give you information on caring for yourself after your procedure. Your doctor may also give you more specific instructions. Call your doctor if you have any problems or questions after your procedure. HOME CARE Do not drive for 24 hours. Wait 1 week before doing any activities that wear you out. Do not stand for a long time. Limit stair climbing to once or twice a day. Rest often. Continue with your usual diet. Drink enough fluids to keep your pee (urine) clear or pale yellow. If you have a hard time pooping (constipation), you may: Take a medicine to help you go poop (laxative) as told by your doctor. Eat more fruit and bran. Drink more fluids. Take showers, not baths, for as long as told by your doctor. Do not swim or use a hot tub until your doctor says it is okay. Have someone with you for 1day after the procedure. Do not douche, use tampons, or have sex (intercourse) until seen by your doctor Only take medicines as told by your doctor. Do not take aspirin. It can cause bleeding. Keep all doctor visits. GET HELP IF: You have cramps or pain not helped by medicine. You have new pain in the belly (abdomen). You have a bad smelling fluid coming from your vagina. You have a rash. You have problems with any medicine. GET HELP RIGHT AWAY IF:  You start to bleed more than a regular period. You have a fever. You have chest pain. You have trouble breathing. You feel dizzy or feel like passing out (fainting). You pass out. You have pain in the tops of your shoulders. You have vaginal bleeding with or without clumps of blood (blood clots). MAKE SURE YOU: Understand these instructions. Will watch your condition. Will get help  right away if you are not doing well or get worse. Document Released: 04/11/2008 Document Revised: 07/08/2013 Document Reviewed: 01/30/2013 ExitCare Patient Information 2015 [employer]. This information is not intended to replace advice given to you by your health care provider. Make sure you discuss any questions you have with your health care provider.   

## 2021-12-04 NOTE — MAU Provider Note (Signed)
History    Event Date/Time   First Provider Initiated Contact with Patient 12/04/21 1246      Chief Complaint:  HCG Level   Barbara Ortiz is  31 y.o. [id number] Patient's last menstrual period was 10/06/2021.Marland Kitchen Patient is here for follow up of quantitative HCG and ongoing surveillance of pregnancy status.   She is [redacted]w[redacted]d weeks gestation  by LMP.  Was seen in MAU 12/02/2021 for low abdominal pain.  Ultrasound showed possible gestational sac, no embryo or yolk sac.  Patient States She Had Ultrasound 11/18/2021 pregnancy care [facility] mobile unit where she was told that she "did not have to worry about ectopic pregnancy" and was about 5 weeks 1 day but does not have documentation of that ultrasound.  Since her last visit, the patient is without new complaint.     ROS Abdominal Pain: Slightly worsened when she was here 2 days ago.  8/10 on pain scale.  Spent all day yesterday curled up in her bed because of pain. Vaginal bleeding: None.   Passage of clots or tissue: Denies Dizziness: Denies  O POS Performed at [facility], [address], [place], [place] [address]   Her previous Quantitative HCG values are:  Latest Reference Range & Units 12/02/21 19:46  HCG, Beta Chain, Quant, S <5 mIU/mL 20,509 (H)  (H): Data is abnormally high  Physical Exam   Patient Vitals for the past 24 hrs:  BP Temp Temp src Pulse Resp SpO2 Height Weight  12/04/21 1130 128/72 98.6 F (37 C) Oral 77 18 100 % -- --  12/04/21 1126 -- -- -- -- -- -- 5\' 4"  (1.626 m) 94.9 kg   Constitutional: Well-nourished female in no apparent distress. No pallor Neuro: Alert and oriented 4 Cardiovascular: Normal rate Respiratory: Normal effort and rate Abdomen: Soft, non-tender. Occasional tenderness to left below umbilicus.  Gynecological Exam: examination not indicated  Labs: Results for orders placed or performed during the hospital encounter of 12/04/21 (from the past 24 hour(s))  hCG, quantitative, pregnancy    Collection Time: 12/04/21 11:06 AM  Result Value Ref Range   hCG, Beta Chain, Quant, S 20,880 (H) <5 mIU/mL    Ultrasound Studies:   12/06/21 OB Transvaginal  Result Date: 12/04/2021 CLINICAL DATA:  Abdominal pain.  Pregnant patient. EXAM: TWIN OBSTETRIC <14WK 12/06/2021 AND TRANSVAGINAL OB US TECHNIQUE: Both transabdominal and transvaginal ultrasound examinations were performed for complete evaluation of the gestation as well as the maternal uterus, adnexal regions, and pelvic cul-de-sac. Transvaginal technique was performed to assess early pregnancy. COMPARISON:  None Available. FINDINGS: Number of IUPs:  2 Chorionicity/Amnionicity:  Dichorionic-diamniotic (thick membrane) TWIN 1 Yolk sac:  Not Visualized. Embryo:  Not Visualized. MSD: 4.4 mm   5 w   1 d CRL:    mm    w  d                  [place] EDC: TWIN 2 Yolk sac:  Not Visualized. Embryo:  Not Visualized. MSD: 4.3 mm   5 w   1 d CRL:    mm    w  d                  [place] EDC: Subchorionic hemorrhage:  None visualized. Maternal uterus/adnexae: The ovaries are unremarkable. A small amount of fluid in the pelvis is likely physiologic. IMPRESSION: Probable early intrauterine gestational sacs, but no yolk sacs, fetal poles, or cardiac activity yet visualized. Recommend follow-up quantitative B-HCG  levels and follow-up US in 14 days to assess viability. This recommendation follows SRU consensus guidelines: Diagnostic Criteria for Nonviable Pregnancy Early in the First Trimester. Malva Limes Med 2013; 983:3825-05. Electronically Signed   By: Gerome Sam III M.D.   On: 12/04/2021 14:29   US OB LESS THAN 14 WEEKS WITH OB TRANSVAGINAL  Result Date: 12/02/2021 CLINICAL DATA:  Back pain and right lower quadrant pain. EXAM: OBSTETRIC <14 WK [place] AND TRANSVAGINAL OB US TECHNIQUE: Both transabdominal and transvaginal ultrasound examinations were performed for complete evaluation of the gestation as well as the maternal uterus, adnexal regions, and pelvic cul-de-sac. Transvaginal  technique was performed to assess early pregnancy. COMPARISON:  None Available. FINDINGS: Intrauterine gestational sac: Single Yolk sac:  Not Visualized. Embryo:  Not Visualized. MSD: 5.3 mm   5 w   2 d Subchorionic hemorrhage:  None visualized. Maternal uterus/adnexae: The bilateral ovaries appear within normal limits. There is trace free fluid in the pelvis. IMPRESSION: 1. Probable early intrauterine gestational sac, but no yolk sac, fetal pole, or cardiac activity yet visualized. Recommend follow-up quantitative B-HCG levels and follow-up US in 14 days to assess viability. This recommendation follows SRU consensus guidelines: Diagnostic Criteria for Nonviable Pregnancy Early in the First Trimester. Malva Limes Med 2013; 397:6734-19. 2. Trace free fluid in the pelvis. Electronically Signed   By: Darliss Cheney M.D.   On: 12/02/2021 20:23    MAU course/MDM: Quantitative hCG ordered  Abdominal pain in early pregnancy with plateau in Quant. Hemodynamically stable and some worsening of abdominal pain. Pt reports that she was verbally informed that ectopic had been excluded, but we do not have access to documentation. Still considered pregnancy of unknown anatomic location. [place] ordered.   Ultrasound still shows pregnancy of unknown anatomic location.  No free fluid in pelvis.  Discussed history, exam, labs, ultrasound with Dr. Vergie Living.  Will come see patient and discuss plan of care.  1600: Per [place] at bedside.  Reviewed labs and ultrasound with patient.  Recommends D&C and discussed possibility of laparoscopy if IUP is not confirmed.  Patient agrees.  Remain NPO.  Last drink over 800 this morning.  Assessment: 1. Pregnancy of unknown anatomic location   2. Abdominal pain during pregnancy, first trimester   3. Spotting complicating pregnancy, first trimester      Plan: Prep for OR N.p.o. CBC and type and screen 140  [place], [place], [place] 12/04/2021, 4:08 PM  2/3

## 2021-12-05 ENCOUNTER — Other Ambulatory Visit: Payer: Self-pay

## 2021-12-05 ENCOUNTER — Encounter (HOSPITAL_COMMUNITY): Payer: Self-pay | Admitting: Obstetrics and Gynecology

## 2021-12-05 LAB — GC/CHLAMYDIA PROBE AMP (~~LOC~~) NOT AT ARMC
Chlamydia: NEGATIVE
Comment: NEGATIVE
Comment: NORMAL
Neisseria Gonorrhea: NEGATIVE

## 2021-12-06 LAB — SURGICAL PATHOLOGY

## 2021-12-09 ENCOUNTER — Ambulatory Visit: Payer: Medicaid Other | Attending: Orthopedic Surgery

## 2021-12-09 DIAGNOSIS — M25561 Pain in right knee: Secondary | ICD-10-CM | POA: Diagnosis present

## 2021-12-09 DIAGNOSIS — M6281 Muscle weakness (generalized): Secondary | ICD-10-CM | POA: Diagnosis present

## 2021-12-09 DIAGNOSIS — R262 Difficulty in walking, not elsewhere classified: Secondary | ICD-10-CM | POA: Diagnosis present

## 2021-12-09 DIAGNOSIS — M25661 Stiffness of right knee, not elsewhere classified: Secondary | ICD-10-CM | POA: Insufficient documentation

## 2021-12-09 NOTE — Therapy (Signed)
OUTPATIENT PHYSICAL THERAPY LOWER EXTREMITY EVALUATION   Patient Name: Barbara Ortiz MRN: [id number] DOB:1990-08-25, 31 y.o., female Today's Date: 12/10/2021   PT End of Session - 12/10/21 2042     Visit Number 1    Number of Visits 13    Date for PT Re-Evaluation 01/27/22    Authorization Type [place] MEDICAID [employer] OF [place]    PT Start Time 1150    PT Stop Time 1235    PT Time Calculation (min) 45 min    Equipment Utilized During Treatment Other (comment)   Knee breace   Activity Tolerance Patient tolerated treatment well    Behavior During Therapy WFL for tasks assessed/performed             Past Medical History:  Diagnosis Date   ACL (anterior cruciate ligament) tear    Anemia    Anxiety    Asthma    Breast anomaly    repeated breast swelling, redness and bleeding from nipples not associated with breastfeeding   Bronchitis    SAB (spontaneous abortion) 12/28/2015   Past Surgical History:  Procedure Laterality Date   CESAREAN SECTION     CESAREAN SECTION  05/13/2020   Procedure: CESAREAN SECTION;  Surgeon: Schermerhorn, Gwen Her, MD;  Location: [facility] ORS;  Service: Obstetrics;;   DILATION AND CURETTAGE OF UTERUS     DILATION AND EVACUATION N/A 12/04/2021   Procedure: DILATATION AND EVACUATION;  Surgeon: Aletha Halim, MD;  Location: [facility];  Service: Gynecology;  Laterality: N/A;   Patient Active Problem List   Diagnosis Date Noted   COVID-19 affecting pregnancy in third trimester 05/09/2020   Previous cesarean delivery affecting pregnancy 12/31/2019   Constipation 10/04/2019   High-risk pregnancy, young multigravida in third trimester 10/04/2019   Supervision of other normal pregnancy, antepartum 09/11/2019   Pap smear of cervix with ASCUS, cannot exclude HGSIL 09/22/2015   High risk HPV infection 09/22/2015   Anemia 05/06/2013   History of postpartum depression 12/16/2012    PCP: Provider, Generic External Data  REFERRING PROVIDER: Vickey Huger, MD  REFERRING DIAG:  M25.561 (ICD-10-CM) - Pain in right knee  S83.511A (ICD-10-CM) - Sprain of anterior cruciate ligament of right knee, initial encounter    THERAPY DIAG:  Acute pain of right knee  Muscle weakness (generalized)  Stiffness of right knee, not elsewhere classified  Difficulty in walking, not elsewhere classified  Rationale for Evaluation and Treatment Rehabilitation  ONSET DATE: 11/13/21  SUBJECTIVE:   SUBJECTIVE STATEMENT: Pt reports injuring her R knee jumping on a tampoline when she landed wrong. Pt reports being told she has a complete tear of the R ACL.  PERTINENT HISTORY: Obesity, Anxiety  PAIN:  Are you having pain? Yes: NPRS scale: 0-6/10 Pain location: R knee Pain description: throb Aggravating factors: Degree of activity c walking and standing, prolonged sitting, sit to stand  Relieving factors: Ice and elevate, or tylenol  PRECAUTIONS: None  WEIGHT BEARING RESTRICTIONS No  FALLS:  Has patient fallen in last 6 months? No  LIVING ENVIRONMENT: Lives with: lives with their family Lives in: House/apartment Pt is able to access and be mobile within home  OCCUPATION: CNA, currently not working  PLOF: Independent  PATIENT GOALS Get back to normal   OBJECTIVE:  Xray 11/13/21 DIAGNOSTIC FINDINGS:  1.  A tiny osseous fragment is identified directly adjacent to the lateral tibial spine on frontal oblique projection, which is indeterminant though could reflect sequelae of ACL avulsion injury given the reported  clinical history of trauma. MRI could further evaluate if there is clinical suspicion for pneumatosis trauma.  2.  Small joint effusion.  3.  Mild medial compartment joint space narrowing. 4.  Sclerotic focus in the lateral proximal tibial metaphysis, favored to reflect a bone island.   PATIENT SURVEYS:  LEFS 28  COGNITION:  Overall cognitive status: Within functional limits for tasks assessed     SENSATION: WFL  EDEMA:   Circumferential: Unable to be measures due to yoga pants. R knee swelling appears to be min to mod.  PALPATION: TTP of the peri-knee minimally   LOWER EXTREMITY ROM:  Active ROM Right eval Left eval  Hip flexion    Hip extension    Hip abduction    Hip adduction    Hip internal rotation    Hip external rotation    Knee flexion 88   Knee extension -15   Ankle dorsiflexion    Ankle plantarflexion    Ankle inversion    Ankle eversion    Extensionlag c SAQ R=-20, L=-10  (Blank rows = not tested)  LOWER EXTREMITY MMT:  MMT Right eval Left eval  Hip flexion 3 5  Hip extension 3+ 5  Hip abduction 5 5  Hip adduction 5 5  Hip internal rotation 5 5  Hip external rotation 5 5  Knee flexion 5 5  Knee extension 5 5  Ankle dorsiflexion    Ankle plantarflexion    Ankle inversion    Ankle eversion     (Blank rows = not tested)  LOWER EXTREMITY SPECIAL TESTS:  Knee special tests: Anterior drawer test: positive , Posterior drawer test: negative, McMurray's test: negative, and collateral testing  ;medial and lateral negative  FUNCTIONAL TESTS:  5 times sit to stand: 21.3  GAIT: Distance walked: 200' Assistive device utilized:  Donjoy knee brace, full flexion and ext stop at [employer] Level of assistance: Complete Independence Comments: Moderate antalgic gait/limp over, decreased knee ext in stance   TODAY'S TREATMENT: Training for heel to toe gait pattern - Supine Quad Set 5 reps 5 hold - Active Straight Leg Raise with Quad Set 5 reps 3 hold - Supine Bridge  5 reps 10" - Short Arc Quad 5 reps 5 hold - Supine Heel Slide  10 reps 5 hold   PATIENT EDUCATION:  Education details: Eval findings, POC, HEP, proper gait pattern, RICE Person educated: Patient Education method: Explanation, Demonstration, Tactile cues, Verbal cues, and Handouts Education comprehension: verbalized understanding, returned demonstration, verbal cues required, and tactile cues required   HOME  EXERCISE PROGRAM: Access Code: [email or web address] URL: [email or web address] Date: 12/09/2021 Prepared by: Gar Ponto  Exercises - Supine Quad Set  - 1 x daily - 7 x weekly - 3 sets - 10 reps - 5 hold - Active Straight Leg Raise with Quad Set  - 1 x daily - 7 x weekly - 3 sets - 10 reps - 3 hold - Supine Bridge  - 1 x daily - 7 x weekly - 3 sets - 10 reps - Short Arc Quad with Ankle Weight  - 2 x daily - 7 x weekly - 3 sets - 10 reps - 5 hold - Supine Heel Slide  - 2 x daily - 7 x weekly - 1 sets - 10 reps - 5 hold  ASSESSMENT:  CLINICAL IMPRESSION: Patient is a 31 y.o. R who was seen today for physical therapy evaluation and treatment for  Pain in right knee  Sprain of anterior cruciate ligament  of right knee, initial encounter  .    OBJECTIVE IMPAIRMENTS Abnormal gait, decreased activity tolerance, decreased mobility, difficulty walking, decreased ROM, decreased strength, increased edema, obesity, and pain.   ACTIVITY LIMITATIONS carrying, lifting, bending, sitting, standing, squatting, stairs, and transfers  PARTICIPATION LIMITATIONS: meal prep, cleaning, laundry, shopping, community activity, and occupation  [place] and 1-2 comorbidities: obesity and anxiety  are also affecting patient's functional outcome.   REHAB POTENTIAL: Good  CLINICAL DECISION MAKING: Stable/uncomplicated  EVALUATION COMPLEXITY: Low   GOALS:  SHORT TERM GOALS: Target date:  12/31/2021 Pt will be Ind in an initail HEP Baseline: Goal status: INITIAL  2.  Increase R knee AROM to -10-105d for improved R knee function Baseline: -15-88d Goal status: INITIAL   LONG TERM GOALS: Target date: 01/27/22   Increase R knee AROM to -5-120d Baseline: -15-88d Goal status: INITIAL  2.  Increased R hip flexion and ext strength to 4+/5 from improved fuction Baseline: see flow sheets Goal status: INITIAL  3.  Pt will demonstrate an improved gait pattern c mild limp deficit  present Baseline: Moderate limp deficit Goal status: INITIAL  4. 5xSTS time willdecrease to 15" or less as indication of improved function Baseline: 21.3' s use of hands Goal status: INITIAL  5.  LEFS will increase to a score of 50/80 as indition of improved function Baseline: 28/80 Goal status: INTIAL   PLAN: PT FREQUENCY: 2x/week  PT DURATION: 6 weeks  PLANNED INTERVENTIONS: Therapeutic exercises, Therapeutic activity, Neuromuscular re-education, Balance training, Gait training, Patient/Family education, Joint mobilization, Stair training, Aquatic Therapy, Dry Needling, Electrical stimulation, Cryotherapy, Moist heat, Taping, Vasopneumatic device, Ultrasound, Ionotophoresis 4mg /ml Dexamethasone, Manual therapy, and Re-evaluation  PLAN FOR NEXT SESSION: Review HEP, progress therex as indicated  [employer] MS, PT 12/10/21 9:35 PM  Check all possible CPT codes: [id number] - Re-evaluation, 97110- Therapeutic Exercise, [phone]- Neuro Re-education, [id number] - Gait Training, 97140 - Manual Therapy, 97530 - Therapeutic Activities, 97535 - Self Care, 97014 - Electrical stimulation (unattended), [id number] - Electrical stimulation (Manual), [id number] - Iontophoresis, [id number] - Ultrasound, [id number] - Vaso, and [id number] - Aquatic therapy     If treatment provided at initial evaluation, no treatment charged due to lack of authorization.

## 2021-12-13 ENCOUNTER — Telehealth: Payer: Self-pay

## 2021-12-22 NOTE — Therapy (Signed)
OUTPATIENT PHYSICAL THERAPY TREATMENT NOTE   Patient Name: Barbara Ortiz MRN: [id number] DOB:1991/07/11, 31 y.o., female Today's Date: 12/23/2021  PCP: Provider, Generic External Data REFERRING PROVIDER: Vickey Huger, MD  END OF SESSION:   PT End of Session - 12/23/21 0802     Visit Number 2    Number of Visits 13    Date for PT Re-Evaluation 01/27/22    Authorization Type [place] [employer] [place]    Authorization - Visit Number 1    Authorization - Number of Visits 12    Progress Note Due on Visit 10    PT Start Time 0802    PT Stop Time 0844    PT Time Calculation (min) 42 min    Equipment Utilized During Treatment Other (comment)   knee brace   Activity Tolerance Patient tolerated treatment well    Behavior During Therapy WFL for tasks assessed/performed             Past Medical History:  Diagnosis Date   ACL (anterior cruciate ligament) tear    Anemia    Anxiety    Asthma    Breast anomaly    repeated breast swelling, redness and bleeding from nipples not associated with breastfeeding   Bronchitis    SAB (spontaneous abortion) 12/28/2015   Past Surgical History:  Procedure Laterality Date   CESAREAN SECTION     CESAREAN SECTION  05/13/2020   Procedure: CESAREAN SECTION;  Surgeon: Ouida Sills, Gwen Her, MD;  Location: [facility];  Service: Obstetrics;;   DILATION AND CURETTAGE OF UTERUS     DILATION AND EVACUATION N/A 12/04/2021   Procedure: DILATATION AND EVACUATION;  Surgeon: Aletha Halim, MD;  Location: [place];  Service: Gynecology;  Laterality: N/A;   Patient Active Problem List   Diagnosis Date Noted   COVID-19 affecting pregnancy in third trimester 05/09/2020   Previous cesarean delivery affecting pregnancy 12/31/2019   Constipation 10/04/2019   High-risk pregnancy, young multigravida in third trimester 10/04/2019   Supervision of other normal pregnancy, antepartum 09/11/2019   Pap smear of cervix with ASCUS, cannot exclude HGSIL  09/22/2015   High risk HPV infection 09/22/2015   Anemia 05/06/2013   History of postpartum depression 12/16/2012    REFERRING DIAG:  M25.561 (ICD-10-CM) - Pain in right knee  S83.511A (ICD-10-CM) - Sprain of anterior cruciate ligament of right knee, initial encounter    THERAPY DIAG:  Acute pain of right knee  Muscle weakness (generalized)  Stiffness of right knee, not elsewhere classified  Difficulty in walking, not elsewhere classified  Rationale for Evaluation and Treatment Rehabilitation  SUBJECTIVE:    SUBJECTIVE STATEMENT: Pt reports her R knee is better with less pain, better ROM and strength. Pt reports consistent completion of her HEP several times a day.   PAIN:  Are you having pain? Yes: NPRS scale: 0/10, 0-5 when she has pain Pain location: R knee Pain description: throb Aggravating factors: Degree of activity c walking and standing, prolonged sitting, sit to stand  Relieving factors: Ice and elevate, or tylenol   PRECAUTIONS: None  PERTINENT HISTORY: Obesity, Anxiety   WEIGHT BEARING RESTRICTIONS No   FALLS:  Has patient fallen in last 6 months? No   LIVING ENVIRONMENT: Lives with: lives with their family Lives in: House/apartment Pt is able to access and be mobile within home   OCCUPATION: CNA, currently not working   PLOF: Independent   PATIENT GOALS Get back to normal      OBJECTIVE: (  objective measures completed at initial evaluation unless otherwise dated) Xray 11/13/21 DIAGNOSTIC FINDINGS:  1.  A tiny osseous fragment is identified directly adjacent to the lateral tibial spine on frontal oblique projection, which is indeterminant though could reflect sequelae of ACL avulsion injury given the reported clinical history of trauma. MRI could further evaluate if there is clinical suspicion for pneumatosis trauma.  2.  Small joint effusion.  3.  Mild medial compartment joint space narrowing. 4.  Sclerotic focus in the lateral proximal tibial  metaphysis, favored to reflect a bone island.    PATIENT SURVEYS:  LEFS 28   COGNITION:           Overall cognitive status: Within functional limits for tasks assessed                          SENSATION: WFL   EDEMA:  Circumferential: Unable to be measures due to yoga pants. R knee swelling appears to be min to mod.   PALPATION: TTP of the peri-knee minimally    LOWER EXTREMITY ROM:   Active ROM Right eval Left eval RT 12/23/21 LT 12/23/21  Hip flexion        Hip extension        Hip abduction        Hip adduction        Hip internal rotation        Hip external rotation        Knee flexion 88   125 125  Knee extension -15   -5 0  Ankle dorsiflexion        Ankle plantarflexion        Ankle inversion        Ankle eversion        Extensionlag c SAQ R=-20, L=-10; 12/23/21=-15d (Blank rows = not tested)   LOWER EXTREMITY MMT:   MMT Right eval Left eval  Hip flexion 3 5  Hip extension 3+ 5  Hip abduction 5 5  Hip adduction 5 5  Hip internal rotation 5 5  Hip external rotation 5 5  Knee flexion 5 5  Knee extension 5 5  Ankle dorsiflexion      Ankle plantarflexion      Ankle inversion      Ankle eversion       (Blank rows = not tested)   LOWER EXTREMITY SPECIAL TESTS:  Knee special tests: Anterior drawer test: positive , Posterior drawer test: negative, McMurray's test: negative, and collateral testing  ;medial and lateral negative   FUNCTIONAL TESTS:  5 times sit to stand: 21.3   GAIT: Distance walked: 200' Assistive device utilized:  [place]joy knee brace, full flexion and ext stop at [employer] Level of assistance: Complete Independence Comments: Moderate antalgic gait/limp over, decreased knee ext in stance     TODAY'S TREATMENT: [facility] Adult PT Treatment:                                                DATE: 12/23/21 Therapeutic Exercise: Nustep L6 53mins UE/LE 2 chair quad set x10 5: 2 chair knee ext/hamstring stretch x3 20 sec SLR c quad set x10 5# LAQ x10 3"  GTB Leg curl x10 3" GTB Sit to stand x15 no hands Updated HEP  Eva treatment: Training for heel to toe gait pattern - Supine  Quad Set 5 reps 5 hold - Active Straight Leg Raise with Quad Set 5 reps 3 hold - Supine Bridge  5 reps 10" - Short Arc Quad 5 reps 5 hold - Supine Heel Slide  10 reps 5 hold     PATIENT EDUCATION:  Education details: Eval findings, POC, HEP, proper gait pattern, RICE Person educated: Patient Education method: Explanation, Demonstration, Tactile cues, Verbal cues, and Handouts Education comprehension: verbalized understanding, returned demonstration, verbal cues required, and tactile cues required     HOME EXERCISE PROGRAM: Access Code: [email or web address] URL: [email or web address] Date: 12/09/2021 Prepared by: Gar Ponto   Exercises - Supine Quad Set  - 1 x daily - 7 x weekly - 3 sets - 10 reps - 5 hold - Active Straight Leg Raise with Quad Set  - 1 x daily - 7 x weekly - 3 sets - 10 reps - 3 hold - Supine Bridge  - 1 x daily - 7 x weekly - 3 sets - 10 reps - Short Arc Quad with Ankle Weight  - 2 x daily - 7 x weekly - 3 sets - 10 reps - 5 hold - Supine Heel Slide  - 2 x daily - 7 x weekly - 1 sets - 10 reps - 5 hold   ASSESSMENT:   CLINICAL IMPRESSION: Pt returns for her first PT visit after the initial Eval. Pt notes she has been completing her HEP, and areas of impairment  with AROM and  strength.have made good progress. Pt's gait pattern is improved. R knee ext in stance is still decreased to less of a degree.Today, the demand of the therex for the R LE was increased with pt tolerating without adverse effects. Pt is making appropriate progress with the R knee and will continue to benefit from skilled PT to address deficits to optimize R knee function.   OBJECTIVE IMPAIRMENTS Abnormal gait, decreased activity tolerance, decreased mobility, difficulty walking, decreased ROM, decreased strength, increased edema, obesity, and pain.    ACTIVITY  LIMITATIONS carrying, lifting, bending, sitting, standing, squatting, stairs, and transfers   PARTICIPATION LIMITATIONS: meal prep, cleaning, laundry, shopping, community activity, and occupation   [place] and 1-2 comorbidities: obesity and anxiety  are also affecting patient's functional outcome.    REHAB POTENTIAL: Good   CLINICAL DECISION MAKING: Stable/uncomplicated   EVALUATION COMPLEXITY: Low     GOALS:   SHORT TERM GOALS: Target date:  12/31/2021 Pt will be Ind in an initail HEP Baseline: Goal status: INITIAL   2.  Increase R knee AROM to -10-105d for improved R knee function Baseline: -15-88d Goal status: INITIAL     LONG TERM GOALS: Target date: 01/27/22    Increase R knee AROM to -5-120d Baseline: -15-88d Goal status: INITIAL   2.  Increased R hip flexion and ext strength to 4+/5 from improved fuction Baseline: see flow sheets Goal status: INITIAL   3.  Pt will demonstrate an improved gait pattern c mild limp deficit present Baseline: Moderate limp deficit Goal status: INITIAL   4. 5xSTS time willdecrease to 15" or less as indication of improved function Baseline: 21.3' s use of hands Goal status: INITIAL   5.  LEFS will increase to a score of 50/80 as indition of improved function Baseline: 28/80 Goal status: INTIAL     PLAN: PT FREQUENCY: 2x/week   PT DURATION: 6 weeks   PLANNED INTERVENTIONS: Therapeutic exercises, Therapeutic activity, Neuromuscular re-education, Balance training, Gait training, Patient/Family education, Joint mobilization, Stair training, Aquatic  Therapy, Dry Needling, Electrical stimulation, Cryotherapy, Moist heat, Taping, Vasopneumatic device, Ultrasound, Ionotophoresis 4mg /ml Dexamethasone, Manual therapy, and Re-evaluation   PLAN FOR NEXT SESSION: Review HEP, progress therex as indicated   [employer] MS, PT 12/23/21 1:44 PM

## 2021-12-23 ENCOUNTER — Ambulatory Visit: Payer: Medicaid Other | Attending: Orthopedic Surgery

## 2021-12-23 DIAGNOSIS — M6281 Muscle weakness (generalized): Secondary | ICD-10-CM | POA: Diagnosis present

## 2021-12-23 DIAGNOSIS — M25561 Pain in right knee: Secondary | ICD-10-CM | POA: Insufficient documentation

## 2021-12-23 DIAGNOSIS — M25661 Stiffness of right knee, not elsewhere classified: Secondary | ICD-10-CM | POA: Insufficient documentation

## 2021-12-23 DIAGNOSIS — R262 Difficulty in walking, not elsewhere classified: Secondary | ICD-10-CM | POA: Insufficient documentation

## 2021-12-29 ENCOUNTER — Ambulatory Visit: Payer: Medicaid Other

## 2021-12-30 NOTE — H&P (Signed)
Obstetrics & Gynecology H&P   Date of Admission: 12/04/2021   Primary Care Provider: Provider, Generic External Data  Reason for Admission: concern for ectopic; abnormal pregnancy  History of Present Illness: Barbara Ortiz is a 31 y.o. [id number] (Patient's last menstrual period was 10/06/2021.), with the above CC.     I d/w her and her partner that she definitely has an abnormal pregnancy (hcg today is 20.880 and was 20,509 on 5/19) and her u/s today doesn't show an IUP either, as it did not on 5/19; there is also no abnormal findings to indicate adnexal/ectopic pregnancy either. I told her that we have to assume it's an abnormal pregnancy that is an ectopic. I told her it's probably a failed IUP based on the u/s findings and that I recommend a suction d&c with possible laparoscopy to remove anything concerning for an ectopic which could be tube or ovary.    She is amenable to this. NPO since 0800 and I d/w Dr. Luisa Hart of pathology re: doing frozen.   OR called and plan for 1730. Cbc and t&s ordered. I d/w them re: increased risk if needs laparoscopy given her h/o two c-sections but if all goes well, which I anticipate, then will plan for d/c from the PACU  ROS: A 12-point review of systems was performed and negative, except as stated in the above HPI.  OBGYN History: As per HPI. OB History  Gravida Para Term Preterm AB Living  9 5 5  0 3 5  SAB IAB Ectopic Multiple Live Births  2 1 0 0 5    # Outcome Date GA Lbr Len/2nd Weight Sex Delivery Anes PTL Lv  9 Current           8 Term 05/13/20 [redacted]w[redacted]d / 02:25 3300 g M CS-LTranv EPI  LIV  7 Term 2014     CS-LTranv   LIV  6 Term 08/08/11    F Vag-Spont EPI  LIV  5 Term 08/03/10    F Vag-Spont EPI  LIV  4 Term 03/12/08    F Vag-Spont EPI  LIV  3 SAB           2 IAB           1 SAB               Past Medical History: Past Medical History:  Diagnosis Date   ACL (anterior cruciate ligament) tear    Anemia    Anxiety    Asthma    Breast  anomaly    repeated breast swelling, redness and bleeding from nipples not associated with breastfeeding   Bronchitis    SAB (spontaneous abortion) 12/28/2015    Past Surgical History: Past Surgical History:  Procedure Laterality Date   CESAREAN SECTION     CESAREAN SECTION  05/13/2020   Procedure: CESAREAN SECTION;  Surgeon: Schermerhorn, 05/15/2020, MD;  Location: [facility];  Service: Obstetrics;;   DILATION AND CURETTAGE OF UTERUS     DILATION AND EVACUATION N/A 12/04/2021   Procedure: DILATATION AND EVACUATION;  Surgeon: 12/06/2021, MD;  Location: [facility];  Service: Gynecology;  Laterality: N/A;    Family History:  Family History  Problem Relation Age of Onset   Hypertension Mother    Diabetes Maternal Grandmother    Hypertension Maternal Grandmother    Diabetes Maternal Grandfather    Hypertension Maternal Grandfather    Cancer Paternal Grandmother     Social History:  Social History   Socioeconomic History  Marital status: Single    Spouse name: Not on file   Number of children: 4   Years of education: Not on file   Highest education level: Not on file  Occupational History   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  Tobacco Use   Smoking status: Never   Smokeless tobacco: Never  Vaping Use   Vaping Use: Never used  Substance and Sexual Activity   Alcohol use: Not Currently    Alcohol/week: 0.0 standard drinks of alcohol   Drug use: No   Sexual activity: Yes    Partners: Male    Birth control/protection: None  Other Topics Concern   Not on file  Social History Narrative   Not on file   Social Determinants of Health   Financial Resource Strain: Not on file  Food Insecurity: Not on file  Transportation Needs: Not on file  Physical Activity: Not on file  Stress: Not on file  Social Connections: Not on file  Intimate Partner Violence: Not on file    Allergy: Allergies  Allergen Reactions   Copper Swelling   Acetaminophen Other (See Comments)    Liver  function    Current Outpatient Medications: No medications prior to admission.     Hospital Medications: No current facility-administered medications for this encounter.   Current Outpatient Medications  Medication Sig Dispense Refill   ADVAIR DISKUS 100-50 MCG/ACT AEPB 1 puff 2 (two) times daily.     albuterol (VENTOLIN HFA) 108 (90 Base) MCG/ACT inhaler Inhale 1 puff into the lungs every 6 (six) hours as needed for wheezing or shortness of breath.     fluticasone (FLONASE) 50 MCG/ACT nasal spray Place 1 spray into both nostrils daily as needed for allergies.     ibuprofen (ADVIL) 600 MG tablet Take 1 tablet (600 mg total) by mouth every 6 (six) hours as needed. 30 tablet 0   ketoconazole (NIZORAL) 2 % shampoo Apply 1 application. topically 2 (two) times a week.     magnesium oxide (MAG-OX) 400 MG tablet Take 400 mg by mouth daily.     cyclobenzaprine (FLEXERIL) 5 MG tablet Take 2 tablets (10 mg total) by mouth 3 (three) times daily as needed for muscle spasms. (Patient not taking: Reported on 12/04/2021) 21 tablet 0   dicyclomine (BENTYL) 20 MG tablet Take 1 tablet (20 mg total) by mouth 2 (two) times daily. (Patient not taking: Reported on 12/04/2021) 20 tablet 0   ferrous sulfate 325 (65 FE) MG tablet Take 1 tablet (325 mg total) by mouth 2 (two) times daily with a meal. (Patient not taking: Reported on 12/04/2021)  3   sertraline (ZOLOFT) 50 MG tablet Take 50 mg by mouth at bedtime.       Physical Exam: Vitals:   12/04/21 1733 12/04/21 2015 12/04/21 2030 12/04/21 2045  BP: 130/67 135/86 138/74 133/75  Pulse: 73 (!) 101 86 80  Resp: 17 (!) 22 20 20   Temp: 98.1 F (36.7 C) 97.7 F (36.5 C)  97.9 F (36.6 C)  TempSrc: Oral     SpO2: 100% 97% 96% 98%  Weight:      Height:         Body mass index is 35.91 kg/m. General appearance: Well nourished, well developed female in no acute distress.  Neck:  Supple, normal appearance, and no thyromegaly  Cardiovascular: S1, S2  normal, no murmur, rub or gallop, regular rate and rhythm Respiratory:  Clear to auscultation bilateral. Normal respiratory effort Abdomen: positive bowel sounds and no masses,  hernias; diffusely non tender to palpation, non distended Neuro/Psych:  Normal mood and affect.  Skin:  Warm and dry.  Extremities: no clubbing, cyanosis, or edema.     I d/w her and her partner that she definitely has an abnormal pregnancy (hcg today is 20.880 and was 20,509 on 5/19) and her u/s today doesn't show an IUP either, as it did not on 5/19; there is also no abnormal findings to indicate adnexal/ectopic pregnancy either. I told her that we have to assume it's an abnormal pregnancy that is an ectopic. I told her it's probably a failed IUP based on the u/s findings and that I recommend a suction d&c with possible laparoscopy to remove anything concerning for an ectopic which could be tube or ovary.    She is amenable to this. NPO since 0800 and I d/w Dr. Luisa Hart of pathology re: doing frozen.   OR called and plan for 1730. Cbc and t&s ordered. I d/w them re: increased risk if needs laparoscopy given her h/o two c-sections but if all goes well, which I anticipate, then will plan for d/c from the PACU  Cornelia Copa MD Attending Center for [facility])

## 2022-01-03 ENCOUNTER — Ambulatory Visit: Payer: Medicaid Other

## 2022-01-03 DIAGNOSIS — R262 Difficulty in walking, not elsewhere classified: Secondary | ICD-10-CM

## 2022-01-03 DIAGNOSIS — M25561 Pain in right knee: Secondary | ICD-10-CM | POA: Diagnosis not present

## 2022-01-03 DIAGNOSIS — M25661 Stiffness of right knee, not elsewhere classified: Secondary | ICD-10-CM

## 2022-01-03 DIAGNOSIS — M6281 Muscle weakness (generalized): Secondary | ICD-10-CM

## 2022-01-03 NOTE — Therapy (Signed)
OUTPATIENT PHYSICAL THERAPY TREATMENT NOTE   Patient Name: Barbara Ortiz MRN: [id number] DOB:June 24, 1991, 31 y.o., female Today's Date: 01/03/2022  PCP: Provider, Generic External Data REFERRING PROVIDER: Vickey Huger, MD  END OF SESSION:   PT End of Session - 01/03/22 1424     Visit Number 3    Number of Visits 13    Date for PT Re-Evaluation 01/27/22    Authorization Type [place] MEDICA[employer] OF     Authorization - Visit Number 2    Authorization - Number of Visits 12    Progress Note Due on Visit 10    PT Start Time 1419    PT Stop Time 1500    PT Time Calculation (min) 41 min    Equipment Utilized During Treatment Other (comment)   knee brace   Activity Tolerance Patient tolerated treatment well    Behavior During Therapy WFL for tasks assessed/performed              Past Medical History:  Diagnosis Date   ACL (anterior cruciate ligament) tear    Anemia    Anxiety    Asthma    Breast anomaly    repeated breast swelling, redness and bleeding from nipples not associated with breastfeeding   Bronchitis    SAB (spontaneous abortion) 12/28/2015   Past Surgical History:  Procedure Laterality Date   CESAREAN SECTION     CESAREAN SECTION  05/13/2020   Procedure: CESAREAN SECTION;  Surgeon: Ouida Sills, Gwen Her, MD;  Location: [facility];  Service: Obstetrics;;   DILATION AND CURETTAGE OF UTERUS     DILATION AND EVACUATION N/A 12/04/2021   Procedure: DILATATION AND EVACUATION;  Surgeon: Aletha Halim, MD;  Location: [place];  Service: Gynecology;  Laterality: N/A;   Patient Active Problem List   Diagnosis Date Noted   COVID-19 affecting pregnancy in third trimester 05/09/2020   Previous cesarean delivery affecting pregnancy 12/31/2019   Constipation 10/04/2019   High-risk pregnancy, young multigravida in third trimester 10/04/2019   Supervision of other normal pregnancy, antepartum 09/11/2019   Pap smear of cervix with ASCUS, cannot exclude  HGSIL 09/22/2015   High risk HPV infection 09/22/2015   Anemia 05/06/2013   History of postpartum depression 12/16/2012    REFERRING DIAG:  M25.561 (ICD-10-CM) - Pain in right knee  S83.511A (ICD-10-CM) - Sprain of anterior cruciate ligament of right knee, initial encounter    THERAPY DIAG:  Acute pain of right knee  Muscle weakness (generalized)  Stiffness of right knee, not elsewhere classified  Difficulty in walking, not elsewhere classified  Rationale for Evaluation and Treatment Rehabilitation  SUBJECTIVE:    SUBJECTIVE STATEMENT: Pt reports she is having difficulty with straightening her R knee. It's been hurting a little more with my being more active. Overall, pt reports improved R knee function and mobility.   PAIN:  Are you having pain? Yes: NPRS scale: 4/10, 0-5 when she has pain Pain location: R knee Pain description: throb Aggravating factors: Degree of activity c walking and standing, prolonged sitting, sit to stand  Relieving factors: Ice and elevate, or tylenol   PRECAUTIONS: None  PERTINENT HISTORY: Obesity, Anxiety   WEIGHT BEARING RESTRICTIONS No   FALLS:  Has patient fallen in last 6 months? No   LIVING ENVIRONMENT: Lives with: lives with their family Lives in: House/apartment Pt is able to access and be mobile within home   OCCUPATION: CNA, currently not working   PLOF: Independent   PATIENT GOALS Get back to  normal      OBJECTIVE: (objective measures completed at initial evaluation unless otherwise dated) Xray 11/13/21 DIAGNOSTIC FINDINGS:  1.  A tiny osseous fragment is identified directly adjacent to the lateral tibial spine on frontal oblique projection, which is indeterminant though could reflect sequelae of ACL avulsion injury given the reported clinical history of trauma. MRI could further evaluate if there is clinical suspicion for pneumatosis trauma.  2.  Small joint effusion.  3.  Mild medial compartment joint space  narrowing. 4.  Sclerotic focus in the lateral proximal tibial metaphysis, favored to reflect a bone island.    PATIENT SURVEYS:  LEFS 28   COGNITION:           Overall cognitive status: Within functional limits for tasks assessed                          SENSATION: WFL   EDEMA:  Circumferential: Unable to be measures due to yoga pants. R knee swelling appears to be min to mod.   PALPATION: TTP of the peri-knee minimally    LOWER EXTREMITY ROM:   Active ROM Right eval Left eval RT 12/23/21 LT 12/23/21  Hip flexion        Hip extension        Hip abduction        Hip adduction        Hip internal rotation        Hip external rotation        Knee flexion 88   125 125  Knee extension -15   -5 0  Ankle dorsiflexion        Ankle plantarflexion        Ankle inversion        Ankle eversion        Extension lag c SAQ R=-20, L=-10; 12/23/21=-15d; 01/03/22= (Blank rows = not tested);   LOWER EXTREMITY MMT:   MMT Right eval Left eval  Hip flexion 3 5  Hip extension 3+ 5  Hip abduction 5 5  Hip adduction 5 5  Hip internal rotation 5 5  Hip external rotation 5 5  Knee flexion 5 5  Knee extension 5 5  Ankle dorsiflexion      Ankle plantarflexion      Ankle inversion      Ankle eversion       (Blank rows = not tested)   LOWER EXTREMITY SPECIAL TESTS:  Knee special tests: Anterior drawer test: positive , Posterior drawer test: negative, McMurray's test: negative, and collateral testing  ;medial and lateral negative   FUNCTIONAL TESTS:  5 times sit to stand: 21.3. 01/03/22= 11.2 sec   GAIT: Distance walked: 200' Assistive device utilized:  Donjoy knee brace, full flexion and ext stop at [employer] Level of assistance: Complete Independence Comments: Moderate antalgic gait/limp over, decreased knee ext in stance     TODAY'S TREATMENT: [facility] Adult PT Treatment:                                                DATE: 01/03/22 Therapeutic Exercise: Nustep L6 52mns UE/LE 2 chair quad  set x10 5: 2 chair knee ext/hamstring stretch x3 20 sec SLR c quad set 2x10 5# LAQ 2x10 5# Leg curl 2x15 3" GTB 5xSTS Seated knee flexion on stool  [facility] Adult  PT Treatment:                                                DATE: 12/23/21 Therapeutic Exercise: Nustep L6 61mns UE/LE 2 chair quad set x10 5: 2 chair knee ext/hamstring stretch x3 20 sec SLR c quad set x10 5# LAQ x10 3" GTB Leg curl x10 3" GTB Sit to stand x15 no hands Updated HEP  Eva treatment: Training for heel to toe gait pattern - Supine Quad Set 5 reps 5 hold - Active Straight Leg Raise with Quad Set 5 reps 3 hold - Supine Bridge  5 reps 10" - Short Arc Quad 5 reps 5 hold - Supine Heel Slide  10 reps 5 hold     PATIENT EDUCATION:  Education details: Eval findings, POC, HEP, proper gait pattern, RICE Person educated: Patient Education method: Explanation, Demonstration, Tactile cues, Verbal cues, and Handouts Education comprehension: verbalized understanding, returned demonstration, verbal cues required, and tactile cues required     HOME EXERCISE PROGRAM: Access Code: [email or web address] URL: [email or web address] Date: 12/09/2021 Prepared by: AGar Ponto  Exercises - Supine Quad Set  - 1 x daily - 7 x weekly - 3 sets - 10 reps - 5 hold - Active Straight Leg Raise with Quad Set  - 1 x daily - 7 x weekly - 3 sets - 10 reps - 3 hold - Supine Bridge  - 1 x daily - 7 x weekly - 3 sets - 10 reps - Short Arc Quad with Ankle Weight  - 2 x daily - 7 x weekly - 3 sets - 10 reps - 5 hold - Supine Heel Slide  - 2 x daily - 7 x weekly - 1 sets - 10 reps - 5 hold   ASSESSMENT:   CLINICAL IMPRESSION: Pt has decreased knee ext in stance phase R LE which is consistent with a a decreased ext lag. Gait deviation is mild. Overall, function is improved as indicated c the 5XSTS. Pt participated in PT today for R knee ext ROM and for LE strengthening with emphasis on the hamstrings. Pt is making appropriate progress at  this stage of her recovery. Pt tolerated today's session without adverse effects. Pt sees Dr. LRonnie Derbytoday with possible determnation of a surgical date for th R knee.  OBJECTIVE IMPAIRMENTS Abnormal gait, decreased activity tolerance, decreased mobility, difficulty walking, decreased ROM, decreased strength, increased edema, obesity, and pain.    ACTIVITY LIMITATIONS carrying, lifting, bending, sitting, standing, squatting, stairs, and transfers   PARTICIPATION LIMITATIONS: meal prep, cleaning, laundry, shopping, community activity, and occupation   [place] 1-2 comorbidities: obesity and anxiety  are also affecting patient's functional outcome.    REHAB POTENTIAL: Good   CLINICAL DECISION MAKING: Stable/uncomplicated   EVALUATION COMPLEXITY: Low     GOALS:   SHORT TERM GOALS: Target date:  12/31/2021 Pt will be Ind in an initail HEP Baseline: Goal status: INITIAL   2.  Increase R knee AROM to -10-105d for improved R knee function Baseline: -15-88d Goal status: INITIAL     LONG TERM GOALS: Target date: 01/27/22    Increase R knee AROM to -5-120d Baseline: -15-88d Goal status: INITIAL   2.  Increased R hip flexion and ext strength to 4+/5 from improved fuction Baseline: see flow sheets Goal status: INITIAL   3.  Pt will  demonstrate an improved gait pattern c mild limp deficit present Baseline: Moderate limp deficit Goal status: INITIAL   4. 5xSTS time willdecrease to 15" or less as indication of improved function. 11.2" Baseline: 21.3' s use of hands Goal status: MET   5.  LEFS will increase to a score of 50/80 as indition of improved function Baseline: 28/80 Goal status: INTIAL     PLAN: PT FREQUENCY: 2x/week   PT DURATION: 6 weeks   PLANNED INTERVENTIONS: Therapeutic exercises, Therapeutic activity, Neuromuscular re-education, Balance training, Gait training, Patient/Family education, Joint mobilization, Stair training, Aquatic Therapy, Dry  Needling, Electrical stimulation, Cryotherapy, Moist heat, Taping, Vasopneumatic device, Ultrasound, Ionotophoresis 10m/ml Dexamethasone, Manual therapy, and Re-evaluation   PLAN FOR NEXT SESSION: Review HEP, progress therex as indicated   ALiberty MutualMS, PT 01/03/22 5:28 PM

## 2022-01-04 NOTE — Therapy (Incomplete)
OUTPATIENT PHYSICAL THERAPY TREATMENT NOTE   Patient Name: Barbara Ortiz MRN: [id number] DOB:04-Sep-1990, 31 y.o., female Today's Date: 01/04/2022  PCP: Provider, Generic External Data REFERRING PROVIDER: Vickey Huger, MD  END OF SESSION:      Past Medical History:  Diagnosis Date   ACL (anterior cruciate ligament) tear    Anemia    Anxiety    Asthma    Breast anomaly    repeated breast swelling, redness and bleeding from nipples not associated with breastfeeding   Bronchitis    SAB (spontaneous abortion) 12/28/2015   Past Surgical History:  Procedure Laterality Date   CESAREAN SECTION     CESAREAN SECTION  05/13/2020   Procedure: CESAREAN SECTION;  Surgeon: Schermerhorn, Gwen Her, MD;  Location: [facility];  Service: Obstetrics;;   DILATION AND CURETTAGE OF UTERUS     DILATION AND EVACUATION N/A 12/04/2021   Procedure: DILATATION AND EVACUATION;  Surgeon: Aletha Halim, MD;  Location: [place];  Service: Gynecology;  Laterality: N/A;   Patient Active Problem List   Diagnosis Date Noted   COVID-19 affecting pregnancy in third trimester 05/09/2020   Previous cesarean delivery affecting pregnancy 12/31/2019   Constipation 10/04/2019   High-risk pregnancy, young multigravida in third trimester 10/04/2019   Supervision of other normal pregnancy, antepartum 09/11/2019   Pap smear of cervix with ASCUS, cannot exclude HGSIL 09/22/2015   High risk HPV infection 09/22/2015   Anemia 05/06/2013   History of postpartum depression 12/16/2012    REFERRING DIAG:  M25.561 (ICD-10-CM) - Pain in right knee  S83.511A (ICD-10-CM) - Sprain of anterior cruciate ligament of right knee, initial encounter    THERAPY DIAG:  No diagnosis found.  Rationale for Evaluation and Treatment Rehabilitation  SUBJECTIVE:    SUBJECTIVE STATEMENT: Pt reports she is having difficulty with straightening her R knee. It's been hurting a little more with my being more active. Overall, pt reports  improved R knee function and mobility.   PAIN:  Are you having pain? Yes: NPRS scale: 4/10, 0-5 when she has pain Pain location: R knee Pain description: throb Aggravating factors: Degree of activity c walking and standing, prolonged sitting, sit to stand  Relieving factors: Ice and elevate, or tylenol   PRECAUTIONS: None  PERTINENT HISTORY: Obesity, Anxiety   WEIGHT BEARING RESTRICTIONS No   FALLS:  Has patient fallen in last 6 months? No   LIVING ENVIRONMENT: Lives with: lives with their family Lives in: House/apartment Pt is able to access and be mobile within home   OCCUPATION: CNA, currently not working   PLOF: Independent   PATIENT GOALS Get back to normal      OBJECTIVE: (objective measures completed at initial evaluation unless otherwise dated) Xray 11/13/21 DIAGNOSTIC FINDINGS:  1.  A tiny osseous fragment is identified directly adjacent to the lateral tibial spine on frontal oblique projection, which is indeterminant though could reflect sequelae of ACL avulsion injury given the reported clinical history of trauma. MRI could further evaluate if there is clinical suspicion for pneumatosis trauma.  2.  Small joint effusion.  3.  Mild medial compartment joint space narrowing. 4.  Sclerotic focus in the lateral proximal tibial metaphysis, favored to reflect a bone island.    PATIENT SURVEYS:  LEFS 28   COGNITION:           Overall cognitive status: Within functional limits for tasks assessed  SENSATION: WFL   EDEMA:  Circumferential: Unable to be measures due to yoga pants. R knee swelling appears to be min to mod.   PALPATION: TTP of the peri-knee minimally    LOWER EXTREMITY ROM:   Active ROM Right eval Left eval RT 12/23/21 LT 12/23/21  Hip flexion        Hip extension        Hip abduction        Hip adduction        Hip internal rotation        Hip external rotation        Knee flexion 88   125 125  Knee extension -15    -5 0  Ankle dorsiflexion        Ankle plantarflexion        Ankle inversion        Ankle eversion        Extension lag c SAQ R=-20, L=-10; 12/23/21=-15d; 01/03/22= (Blank rows = not tested);   LOWER EXTREMITY MMT:   MMT Right eval Left eval  Hip flexion 3 5  Hip extension 3+ 5  Hip abduction 5 5  Hip adduction 5 5  Hip internal rotation 5 5  Hip external rotation 5 5  Knee flexion 5 5  Knee extension 5 5  Ankle dorsiflexion      Ankle plantarflexion      Ankle inversion      Ankle eversion       (Blank rows = not tested)   LOWER EXTREMITY SPECIAL TESTS:  Knee special tests: Anterior drawer test: positive , Posterior drawer test: negative, McMurray's test: negative, and collateral testing  ;medial and lateral negative   FUNCTIONAL TESTS:  5 times sit to stand: 21.3. 01/03/22= 11.2 sec   GAIT: Distance walked: 200' Assistive device utilized:  Donjoy knee brace, full flexion and ext stop at [employer] Level of assistance: Complete Independence Comments: Moderate antalgic gait/limp over, decreased knee ext in stance     TODAY'S TREATMENT6/23/ [facility] Adult PT Treatment:                                                DATE: 01/05/22 Therapeutic Exercise: *** Manual Therapy: *** Neuromuscular re-ed: *** Therapeutic Activity: *** Modalities: *** Self Care: ***  [facility] Adult PT Treatment:                                                DATE: 01/03/22 Therapeutic Exercise: Nustep L6 52mns UE/LE 2 chair quad set x10 5: 2 chair knee ext/hamstring stretch x3 20 sec SLR c quad set 2x10 5# LAQ 2x10 5# Leg curl 2x15 3" GTB 5xSTS Seated knee flexion on stool  [facility] Adult PT Treatment:                                                DATE: 12/23/21 Therapeutic Exercise: Nustep L6 527ms UE/LE 2 chair quad set x10 5: 2 chair knee ext/hamstring stretch x3 20 sec SLR c quad set x10 5# LAQ x10 3" GTB Leg curl x10 3" GTB  Sit to stand x15 no hands Updated HEP  Eva treatment: Training for  heel to toe gait pattern - Supine Quad Set 5 reps 5 hold - Active Straight Leg Raise with Quad Set 5 reps 3 hold - Supine Bridge  5 reps 10" - Short Arc Quad 5 reps 5 hold - Supine Heel Slide  10 reps 5 hold     PATIENT EDUCATION:  Education details: Eval findings, POC, HEP, proper gait pattern, RICE Person educated: Patient Education method: Explanation, Demonstration, Tactile cues, Verbal cues, and Handouts Education comprehension: verbalized understanding, returned demonstration, verbal cues required, and tactile cues required     HOME EXERCISE PROGRAM: Access Code: [email or web address] URL: [email or web address] Date: 12/09/2021 Prepared by: Gar Ponto   Exercises - Supine Quad Set  - 1 x daily - 7 x weekly - 3 sets - 10 reps - 5 hold - Active Straight Leg Raise with Quad Set  - 1 x daily - 7 x weekly - 3 sets - 10 reps - 3 hold - Supine Bridge  - 1 x daily - 7 x weekly - 3 sets - 10 reps - Short Arc Quad with Ankle Weight  - 2 x daily - 7 x weekly - 3 sets - 10 reps - 5 hold - Supine Heel Slide  - 2 x daily - 7 x weekly - 1 sets - 10 reps - 5 hold   ASSESSMENT:   CLINICAL IMPRESSION: Pt has decreased knee ext in stance phase R LE which is consistent with a a decreased ext lag. Gait deviation is mild. Overall, function is improved as indicated c the 5XSTS. Pt participated in PT today for R knee ext ROM and for LE strengthening with emphasis on the hamstrings. Pt is making appropriate progress at this stage of her recovery. Pt tolerated today's session without adverse effects. Pt sees Dr. Ronnie Derby today with possible determnation of a surgical date for th R knee.  OBJECTIVE IMPAIRMENTS Abnormal gait, decreased activity tolerance, decreased mobility, difficulty walking, decreased ROM, decreased strength, increased edema, obesity, and pain.    ACTIVITY LIMITATIONS carrying, lifting, bending, sitting, standing, squatting, stairs, and transfers   PARTICIPATION LIMITATIONS: meal  prep, cleaning, laundry, shopping, community activity, and occupation   [place] and 1-2 comorbidities: obesity and anxiety  are also affecting patient's functional outcome.    REHAB POTENTIAL: Good   CLINICAL DECISION MAKING: Stable/uncomplicated   EVALUATION COMPLEXITY: Low     GOALS:   SHORT TERM GOALS: Target date:  12/31/2021 Pt will be Ind in an initail HEP Baseline: Goal status: INITIAL   2.  Increase R knee AROM to -10-105d for improved R knee function Baseline: -15-88d Goal status: INITIAL     LONG TERM GOALS: Target date: 01/27/22    Increase R knee AROM to -5-120d Baseline: -15-88d Goal status: INITIAL   2.  Increased R hip flexion and ext strength to 4+/5 from improved fuction Baseline: see flow sheets Goal status: INITIAL   3.  Pt will demonstrate an improved gait pattern c mild limp deficit present Baseline: Moderate limp deficit Goal status: INITIAL   4. 5xSTS time willdecrease to 15" or less as indication of improved function. 11.2" Baseline: 21.3' s use of hands Goal status: MET   5.  LEFS will increase to a score of 50/80 as indition of improved function Baseline: 28/80 Goal status: INTIAL     PLAN: PT FREQUENCY: 2x/week   PT DURATION: 6 weeks   PLANNED INTERVENTIONS: Therapeutic exercises, Therapeutic  activity, Neuromuscular re-education, Balance training, Gait training, Patient/Family education, Joint mobilization, Stair training, Aquatic Therapy, Dry Needling, Electrical stimulation, Cryotherapy, Moist heat, Taping, Vasopneumatic device, Ultrasound, Ionotophoresis 44m/ml Dexamethasone, Manual therapy, and Re-evaluation   PLAN FOR NEXT SESSION: Review HEP, progress therex as indicated   [employer]MS, PT 01/04/22 9:35 PM

## 2022-01-05 ENCOUNTER — Ambulatory Visit: Payer: Medicaid Other

## 2022-01-05 DIAGNOSIS — M25561 Pain in right knee: Secondary | ICD-10-CM | POA: Diagnosis not present

## 2022-01-05 DIAGNOSIS — M25661 Stiffness of right knee, not elsewhere classified: Secondary | ICD-10-CM

## 2022-01-05 DIAGNOSIS — M6281 Muscle weakness (generalized): Secondary | ICD-10-CM

## 2022-01-05 DIAGNOSIS — R262 Difficulty in walking, not elsewhere classified: Secondary | ICD-10-CM

## 2022-01-09 ENCOUNTER — Other Ambulatory Visit: Payer: Self-pay

## 2022-01-09 ENCOUNTER — Encounter: Payer: Self-pay | Admitting: Obstetrics and Gynecology

## 2022-01-09 ENCOUNTER — Ambulatory Visit (INDEPENDENT_AMBULATORY_CARE_PROVIDER_SITE_OTHER): Payer: Medicaid Other | Admitting: Obstetrics and Gynecology

## 2022-01-09 VITALS — BP 128/69 | HR 83 | Wt 213.0 lb

## 2022-01-09 DIAGNOSIS — Z09 Encounter for follow-up examination after completed treatment for conditions other than malignant neoplasm: Secondary | ICD-10-CM

## 2022-01-10 ENCOUNTER — Ambulatory Visit: Payer: Medicaid Other

## 2022-01-12 ENCOUNTER — Ambulatory Visit: Payer: Medicaid Other

## 2022-01-18 ENCOUNTER — Encounter: Payer: Medicaid Other | Admitting: Physical Therapy

## 2022-02-02 ENCOUNTER — Telehealth: Payer: Self-pay | Admitting: Family Medicine

## 2022-02-02 NOTE — Telephone Encounter (Signed)
Returned patients call. She reports she had a missed miscarriage 2 months ago and then saw Dr. Vergie Living. She had a D&C. She has a new pregnancy now.   Patient reports she was taking a medication that she is concerned about. She was not able to give me the name of it, she has stopped taking.    She reports her last LMP was 01/05/2022.   Patient had concerns that she did not know her last baby had passed at 5 weeks and did not know until 8 weeks. She is concerned about this pregnancy living. Reviewed that 1 in 4 pregnancies can fail. She is going to see [facility] tomorrow. She asked if she has to wait 8-10 weeks to be seen in the office, confirmed this.   Reviewed with patient that if she is experiencing severe abdominal pain or bleeding like a period she is asked to go to the Maternity Assessment Unit at the [facility] for evaluation. Patient voiced understanding.

## 2022-02-02 NOTE — Telephone Encounter (Signed)
Would like to speak to a nurse about her pregnancy, she is coming in for a confirmation on July 26, but still would like to speak to a nurse prior to coming in.

## 2022-02-07 ENCOUNTER — Inpatient Hospital Stay (HOSPITAL_COMMUNITY)
Admission: AD | Admit: 2022-02-07 | Discharge: 2022-02-08 | Disposition: A | Discharge status: Home / Self Care | Payer: Medicaid Other | Attending: Obstetrics and Gynecology | Admitting: Obstetrics and Gynecology

## 2022-02-07 ENCOUNTER — Encounter (HOSPITAL_COMMUNITY): Payer: Self-pay | Admitting: Obstetrics and Gynecology

## 2022-02-07 DIAGNOSIS — O26891 Other specified pregnancy related conditions, first trimester: Secondary | ICD-10-CM | POA: Insufficient documentation

## 2022-02-07 DIAGNOSIS — R109 Unspecified abdominal pain: Secondary | ICD-10-CM | POA: Diagnosis not present

## 2022-02-07 DIAGNOSIS — O3680X0 Pregnancy with inconclusive fetal viability, not applicable or unspecified: Secondary | ICD-10-CM | POA: Diagnosis not present

## 2022-02-07 DIAGNOSIS — O26899 Other specified pregnancy related conditions, unspecified trimester: Secondary | ICD-10-CM

## 2022-02-07 DIAGNOSIS — Z3A01 Less than 8 weeks gestation of pregnancy: Secondary | ICD-10-CM | POA: Insufficient documentation

## 2022-02-07 NOTE — MAU Note (Signed)
..  Barbara Ortiz is a 31 y.o. at [redacted]w[redacted]d here in MAU reporting: started having abdominal cramping earlier today and had a positive pregnancy test. Has not been seen for pregnancy yet. . Denies vaginal bleeding.  LMP: 01/05/2022 Onset of complaint: today Pain score: 8/10 Vitals:   02/07/22 2300  BP: 136/78  Pulse: 82  Resp: 17  Temp: 97.8 F (36.6 C)  SpO2: 100%      Lab orders placed from triage:Preg

## 2022-02-07 NOTE — MAU Provider Note (Signed)
Chief Complaint: Abdominal Pain   Event Date/Time   First Provider Initiated Contact with Patient 02/07/22 2359        SUBJECTIVE HPI: Barbara Ortiz is a 31 y.o. [id number] at [redacted]w[redacted]d by LMP who presents to maternity admissions reporting abdominal cramping.  .She denies vaginal bleeding, vaginal itching/burning, urinary symptoms, h/a, dizziness, n/v, or fever/chills.    Last pregnancy ended in Missed abortion, for which she had a D&C on 12/04/21.     Abdominal Pain This is a new problem. The current episode started in the past 7 days. The onset quality is gradual. The problem occurs intermittently. The problem has been unchanged. The pain is located in the suprapubic region. The quality of the pain is cramping. The abdominal pain does not radiate. Pertinent negatives include no constipation, diarrhea, dysuria, fever or myalgias. Nothing aggravates the pain. The pain is relieved by Nothing. She has tried nothing for the symptoms.   RN note: Barbara Ortiz is a 31 y.o. at [redacted]w[redacted]d here in MAU reporting: started having abdominal cramping earlier today and had a positive pregnancy test. Has not been seen for pregnancy yet. . Denies vaginal bleeding.  LMP: 01/05/2022 Onset of complaint:   Past Medical History:  Diagnosis Date   ACL (anterior cruciate ligament) tear    Anemia    Anxiety    Asthma    Breast anomaly    repeated breast swelling, redness and bleeding from nipples not associated with breastfeeding   Bronchitis    SAB (spontaneous abortion) 12/28/2015   Past Surgical History:  Procedure Laterality Date   CESAREAN SECTION     CESAREAN SECTION  05/13/2020   Procedure: CESAREAN SECTION;  Surgeon: Schermerhorn, Ihor Austin, MD;  Location: [facility] ORS;  Service: Obstetrics;;   DILATION AND CURETTAGE OF UTERUS     DILATION AND EVACUATION N/A 12/04/2021   Procedure: DILATATION AND EVACUATION;  Surgeon:  Bing, MD;  Location: [facility];  Service: Gynecology;  Laterality: N/A;   Social  History   Socioeconomic History   Marital status: Single    Spouse name: Not on file   Number of children: 4   Years of education: Not on file   Highest education level: Not on file  Occupational History   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  Tobacco Use   Smoking status: Never   Smokeless tobacco: Never  Vaping Use   Vaping Use: Never used  Substance and Sexual Activity   Alcohol use: Not Currently    Alcohol/week: 0.0 standard drinks of alcohol   Drug use: No   Sexual activity: Yes    Partners: Male    Birth control/protection: None  Other Topics Concern   Not on file  Social History Narrative   Not on file   Social Determinants of Health   Financial Resource Strain: Not on file  Food Insecurity: Not on file  Transportation Needs: Not on file  Physical Activity: Not on file  Stress: Not on file  Social Connections: Not on file  Intimate Partner Violence: Not on file   No current facility-administered medications on file prior to encounter.   Current Outpatient Medications on File Prior to Encounter  Medication Sig Dispense Refill   ADVAIR DISKUS 100-50 MCG/ACT AEPB 1 puff 2 (two) times daily.     albuterol (VENTOLIN HFA) 108 (90 Base) MCG/ACT inhaler Inhale 1 puff into the lungs every 6 (six) hours as needed for wheezing or shortness of breath.     cyclobenzaprine (FLEXERIL) 5 MG tablet  Take 2 tablets (10 mg total) by mouth 3 (three) times daily as needed for muscle spasms. (Patient not taking: Reported on 12/04/2021) 21 tablet 0   dicyclomine (BENTYL) 20 MG tablet Take 1 tablet (20 mg total) by mouth 2 (two) times daily. (Patient not taking: Reported on 12/04/2021) 20 tablet 0   ferrous sulfate 325 (65 FE) MG tablet Take 1 tablet (325 mg total) by mouth 2 (two) times daily with a meal. (Patient not taking: Reported on 12/04/2021)  3   fluticasone (FLONASE) 50 MCG/ACT nasal spray Place 1 spray into both nostrils daily as needed for allergies.     ibuprofen (ADVIL) 600 MG tablet  Take 1 tablet (600 mg total) by mouth every 6 (six) hours as needed. 30 tablet 0   ketoconazole (NIZORAL) 2 % shampoo Apply 1 application. topically 2 (two) times a week.     magnesium oxide (MAG-OX) 400 MG tablet Take 400 mg by mouth daily.     sertraline (ZOLOFT) 50 MG tablet Take 50 mg by mouth at bedtime.     Allergies  Allergen Reactions   Copper Swelling   Acetaminophen Other (See Comments)    Liver function    I have reviewed patient's Past Medical Hx, Surgical Hx, Family Hx, Social Hx, medications and allergies.   ROS:  Review of Systems  Constitutional:  Negative for fever.  Gastrointestinal:  Positive for abdominal pain. Negative for constipation and diarrhea.  Genitourinary:  Negative for dysuria.  Musculoskeletal:  Negative for myalgias.   Review of Systems  Other systems negative   Physical Exam  Physical Exam Patient Vitals for the past 24 hrs:  BP Temp Temp src Pulse Resp SpO2 Height Weight  02/07/22 2300 136/78 97.8 F (36.6 C) Oral 82 17 100 % 5\' 4"  (1.626 m) 98.1 kg   Constitutional: Well-developed, well-nourished female in no acute distress.  Cardiovascular: normal rate Respiratory: normal effort GI: Abd soft, non-tender. Pos BS x 4 MS: Extremities nontender, no edema, normal ROM Neurologic: Alert and oriented x 4.  GU: Neg CVAT.  PELVIC EXAM: deferred in lieu of transvaginal ultrasound  LAB RESULTS Results for orders placed or performed during the hospital encounter of 02/07/22 (from the past 24 hour(s))  CBC     Status: Abnormal   Collection Time: 02/07/22 11:28 PM  Result Value Ref Range   WBC 7.4 4.0 - 10.5 K/uL   RBC 4.03 3.87 - 5.11 MIL/uL   Hemoglobin 10.4 (L) 12.0 - 15.0 g/dL   HCT 02/09/22 (L) 67.1 - 24.5 %   MCV 79.4 (L) 80.0 - 100.0 fL   MCH 25.8 (L) 26.0 - 34.0 pg   MCHC 32.5 30.0 - 36.0 g/dL   RDW 80.9 98.3 - 38.2 %   Platelets 337 150 - 400 K/uL   nRBC 0.0 0.0 - 0.2 %  Comprehensive metabolic panel     Status: Abnormal    Collection Time: 02/07/22 11:28 PM  Result Value Ref Range   Sodium 136 135 - 145 mmol/L   Potassium 3.8 3.5 - 5.1 mmol/L   Chloride 106 98 - 111 mmol/L   CO2 24 22 - 32 mmol/L   Glucose, Bld 96 70 - 99 mg/dL   BUN 7 6 - 20 mg/dL   Creatinine, Ser 02/09/22 0.44 - 1.00 mg/dL   Calcium 9.1 8.9 - 3.97 mg/dL   Total Protein 6.9 6.5 - 8.1 g/dL   Albumin 3.4 (L) 3.5 - 5.0 g/dL   AST 14 (L) 15 -  41 U/L   ALT 15 0 - 44 U/L   Alkaline Phosphatase 62 38 - 126 U/L   Total Bilirubin 0.2 (L) 0.3 - 1.2 mg/dL   GFR, Estimated >97 >67 mL/min   Anion gap 6 5 - 15  hCG, quantitative, pregnancy     Status: Abnormal   Collection Time: 02/07/22 11:28 PM  Result Value Ref Range   hCG, Beta Chain, Quant, S 1,962 (H) <5 mIU/mL     --/--/O POS (05/21 1603)  IMAGING US OB LESS THAN 14 WEEKS WITH OB TRANSVAGINAL  Addendum Date: 02/08/2022   ADDENDUM REPORT: 02/08/2022 00:54 ADDENDUM: Correction to the findings section of the report as below: Intrauterine gestational sac: None Yolk sac:  Not visualized. Embryo:  Not visualized. Cardiac Activity: Not visualized. Electronically Signed   By: Burman Nieves M.D.   On: 02/08/2022 00:54   Result Date: 02/08/2022 CLINICAL DATA:  Abdominal pain today. No vaginal bleeding. Estimated gestational age by LMP is 4 weeks 5 days. Quantitative beta HCG is not provided. EXAM: OBSTETRIC <14 WK [place] AND TRANSVAGINAL OB US TECHNIQUE: Both transabdominal and transvaginal ultrasound examinations were performed for complete evaluation of the gestation as well as the maternal uterus, adnexal regions, and pelvic cul-de-sac. Transvaginal technique was performed to assess early pregnancy. COMPARISON:  12/04/2021 FINDINGS: Intrauterine gestational sac: None Yolk sac:  Visualized. Embryo:  Visualized. Cardiac Activity: Visualized. Maternal uterus/adnexae: The uterus is anteverted. No myometrial mass lesions are demonstrated. Small nabothian cysts in the cervix. Endometrial stripe appears  normal. Both ovaries are visualized and appear normal. Small corpus luteal cyst is suggested in the right ovary. No free fluid in the pelvis. IMPRESSION: No intrauterine gestational sac, yolk sac, or fetal pole identified. Differential considerations include intrauterine pregnancy too early to be sonographically visualized, missed abortion, or ectopic pregnancy. Followup ultrasound is recommended in 10-14 days for further evaluation. Electronically Signed: By: Burman Nieves M.D. On: 02/08/2022 00:31     MAU Management/MDM: I have reviewed the triage vital signs and the nursing notes.   Pertinent labs & imaging results that were available during my care of the patient were reviewed by me and considered in my medical decision making (see chart for details).      I have reviewed her medical records including past results, notes and treatments.   Ordered usual first trimester r/o ectopic labs.   Pelvic cultures done Will check baseline Ultrasound to rule out ectopic.  This bleeding/pain can represent a normal pregnancy with bleeding, spontaneous abortion or even an ectopic which can be life-threatening.  The process as listed above helps to determine which of these is present.  Reviewed US showed nothing in uterus or adnexa, and this can be consistent with an early pregnancy, or pregnancy located in extrauterine space.  It also can indicate a failed pregnancy Recommend repeating HCG in 48 hours in office (appt made for Friday)  ASSESSMENT Pregnancy at [redacted]w[redacted]d Abdominal pain in early pregnancy Pregnancy of unknown location  PLAN Discharge home Plan to repeat HCG level in 48 hours in clinic  Will repeat  Ultrasound in about 7-10 days if HCG levels double appropriately  Ectopic precautions  Pt stable at time of discharge. Encouraged to return here if she develops worsening of symptoms, increase in pain, fever, or other concerning symptoms.    Wynelle Bourgeois CNM, MSN Certified  Nurse-Midwife 02/07/2022  11:59 PM

## 2022-02-08 ENCOUNTER — Inpatient Hospital Stay (HOSPITAL_COMMUNITY): Payer: Medicaid Other

## 2022-02-08 ENCOUNTER — Ambulatory Visit: Payer: Medicaid Other

## 2022-02-08 DIAGNOSIS — Z3A01 Less than 8 weeks gestation of pregnancy: Secondary | ICD-10-CM

## 2022-02-08 DIAGNOSIS — R109 Unspecified abdominal pain: Secondary | ICD-10-CM

## 2022-02-08 DIAGNOSIS — O3680X0 Pregnancy with inconclusive fetal viability, not applicable or unspecified: Secondary | ICD-10-CM

## 2022-02-08 DIAGNOSIS — O26899 Other specified pregnancy related conditions, unspecified trimester: Secondary | ICD-10-CM

## 2022-02-08 LAB — COMPREHENSIVE METABOLIC PANEL
ALT: 15 U/L (ref 0–44)
AST: 14 U/L — ABNORMAL LOW (ref 15–41)
Albumin: 3.4 g/dL — ABNORMAL LOW (ref 3.5–5.0)
Alkaline Phosphatase: 62 U/L (ref 38–126)
Anion gap: 6 (ref 5–15)
BUN: 7 mg/dL (ref 6–20)
CO2: 24 mmol/L (ref 22–32)
Calcium: 9.1 mg/dL (ref 8.9–10.3)
Chloride: 106 mmol/L (ref 98–111)
Creatinine, Ser: 0.66 mg/dL (ref 0.44–1.00)
GFR, Estimated: >60 mL/min (ref >60)
Glucose, Bld: 96 mg/dL (ref 70–99)
Potassium: 3.8 mmol/L (ref 3.5–5.1)
Sodium: 136 mmol/L (ref 135–145)
Total Bilirubin: 0.2 mg/dL — ABNORMAL LOW (ref 0.3–1.2)
Total Protein: 6.9 g/dL (ref 6.5–8.1)

## 2022-02-08 LAB — CBC
HCT: 32 % — ABNORMAL LOW (ref 36.0–46.0)
Hemoglobin: 10.4 g/dL — ABNORMAL LOW (ref 12.0–15.0)
MCH: 25.8 pg — ABNORMAL LOW (ref 26.0–34.0)
MCHC: 32.5 g/dL (ref 30.0–36.0)
MCV: 79.4 fL — ABNORMAL LOW (ref 80.0–100.0)
Platelets: 337 10*3/uL (ref 150–400)
RBC: 4.03 MIL/uL (ref 3.87–5.11)
RDW: 15.5 % (ref 11.5–15.5)
WBC: 7.4 10*3/uL (ref 4.0–10.5)
nRBC: 0 % (ref 0.0–0.2)

## 2022-02-08 LAB — HCG, QUANTITATIVE, PREGNANCY: hCG, Beta Chain, Quant, S: 1962 m[IU]/mL — ABNORMAL HIGH (ref <5)

## 2022-02-10 ENCOUNTER — Other Ambulatory Visit: Payer: Self-pay

## 2022-02-10 ENCOUNTER — Ambulatory Visit (INDEPENDENT_AMBULATORY_CARE_PROVIDER_SITE_OTHER): Payer: Medicaid Other | Admitting: *Deleted

## 2022-02-10 ENCOUNTER — Telehealth: Payer: Self-pay | Admitting: Obstetrics and Gynecology

## 2022-02-10 VITALS — BP 129/74 | HR 84 | Ht 64.0 in | Wt 212.1 lb

## 2022-02-10 DIAGNOSIS — O3680X0 Pregnancy with inconclusive fetal viability, not applicable or unspecified: Secondary | ICD-10-CM

## 2022-02-10 LAB — BETA HCG QUANT (REF LAB): hCG Quant: 2988 m[IU]/mL

## 2022-02-10 NOTE — Telephone Encounter (Signed)
PUL. Called patient, no answer, left message. In clinic today no pain or bleeding, stat beta obtained, has risen >40% in the past 48 hours which is appropriate, return precautions shared, will message clinic staff to arrange repeat u/s 10-14 days from date of last u/s.

## 2022-02-10 NOTE — Progress Notes (Signed)
Here for stat bhcg. Denies pain. Denies bleeding. Explained we will draw stat bhcg and have her leave office. She will be called with results in a few hours after results received and reviewed by provider. Also informed her about [facility] services if needed . She voices understanding.   Mavrik Bynum,RN 1200 stat bhcg result not received. Called Dr Ashok Pall, attending at [facility] and he will follow up on stat bhcg. Nancy Fetter

## 2022-02-13 ENCOUNTER — Telehealth: Payer: Self-pay

## 2022-02-13 ENCOUNTER — Other Ambulatory Visit: Payer: Self-pay

## 2022-02-13 ENCOUNTER — Ambulatory Visit (INDEPENDENT_AMBULATORY_CARE_PROVIDER_SITE_OTHER): Payer: Medicaid Other

## 2022-02-13 VITALS — BP 122/75 | HR 76 | Wt 214.2 lb

## 2022-02-13 DIAGNOSIS — R109 Unspecified abdominal pain: Secondary | ICD-10-CM

## 2022-02-13 DIAGNOSIS — O26899 Other specified pregnancy related conditions, unspecified trimester: Secondary | ICD-10-CM

## 2022-02-13 LAB — BETA HCG QUANT (REF LAB): hCG Quant: 5437 m[IU]/mL

## 2022-02-13 NOTE — Telephone Encounter (Signed)
Call placed to pt. Spoke with pt. Pt able to come today at 1030am for Stat Beta per Dr Shawnie Pons. Pt verbalized understanding and agreeable to date and time of appt.  Tressa Maldonado,RNC

## 2022-02-13 NOTE — Telephone Encounter (Signed)
-----   Message from Reva Bores, MD sent at 02/12/2022  9:30 AM EDT ----- Consider repeat HCG on Monday stat please

## 2022-02-13 NOTE — Progress Notes (Signed)
Beta HCG Follow-up Visit  Barbara Ortiz presents to [facility] for follow-up beta HCG lab. She was seen in MAU for abdominal pain  02/07/22. Patient denies pain, bleeding today. Discussed with patient that we are following beta HCG levels today. Valid contact number for patient confirmed. I will call the patient with results.   Beta HCG results: 02/07/22  HCG 1,962  02/10/22  HCG 2,988  02/13/22  HCG 5,437   Results and patient history reviewed with Dr. Alysia Penna, who states results have increased and patient should have an ultrasound in 10-14 days. Ultrasound scheduled. Patient called and informed of plan for follow-up.  [name] 02/13/2022

## 2022-02-28 ENCOUNTER — Other Ambulatory Visit: Payer: Self-pay | Admitting: Obstetrics and Gynecology

## 2022-02-28 ENCOUNTER — Ambulatory Visit (INDEPENDENT_AMBULATORY_CARE_PROVIDER_SITE_OTHER): Payer: Medicaid Other

## 2022-02-28 DIAGNOSIS — O3680X0 Pregnancy with inconclusive fetal viability, not applicable or unspecified: Secondary | ICD-10-CM | POA: Diagnosis not present

## 2022-02-28 DIAGNOSIS — O26899 Other specified pregnancy related conditions, unspecified trimester: Secondary | ICD-10-CM

## 2022-02-28 DIAGNOSIS — R109 Unspecified abdominal pain: Secondary | ICD-10-CM

## 2022-03-12 ENCOUNTER — Inpatient Hospital Stay (HOSPITAL_COMMUNITY)
Admission: AD | Admit: 2022-03-12 | Discharge: 2022-03-12 | Disposition: A | Discharge status: Home / Self Care | Payer: Medicaid Other | Attending: Obstetrics and Gynecology | Admitting: Obstetrics and Gynecology

## 2022-03-12 ENCOUNTER — Encounter (HOSPITAL_COMMUNITY): Payer: Self-pay | Admitting: Obstetrics and Gynecology

## 2022-03-12 DIAGNOSIS — O99511 Diseases of the respiratory system complicating pregnancy, first trimester: Secondary | ICD-10-CM | POA: Insufficient documentation

## 2022-03-12 DIAGNOSIS — Z3A09 9 weeks gestation of pregnancy: Secondary | ICD-10-CM | POA: Diagnosis not present

## 2022-03-12 DIAGNOSIS — O209 Hemorrhage in early pregnancy, unspecified: Secondary | ICD-10-CM | POA: Diagnosis not present

## 2022-03-12 DIAGNOSIS — O200 Threatened abortion: Secondary | ICD-10-CM | POA: Insufficient documentation

## 2022-03-12 NOTE — MAU Provider Note (Signed)
History     [id number]  Arrival date and time: 03/12/22 2216    Chief Complaint  Patient presents with   Vaginal Bleeding     HPI Barbara Ortiz is a 31 y.o. at [redacted]w[redacted]d who presents for vaginal bleeding. Started this afternoon. Noticed dark red blood in her bathing suit & on her wash cloth while in the shower. Continue to see some blood streaked on a pad. Not filling up pads or passing clots. Denies abdominal pain, vaginal discharge, or recent intercourse.    OB History     Gravida  10   Para  5   Term  5   Preterm  0   AB  4   Living  5      SAB  3   IAB  1   Ectopic  0   Multiple  0   Live Births  5           Past Medical History:  Diagnosis Date   ACL (anterior cruciate ligament) tear    Anemia    Anxiety    Asthma    Breast anomaly    repeated breast swelling, redness and bleeding from nipples not associated with breastfeeding   Bronchitis     Past Surgical History:  Procedure Laterality Date   CESAREAN SECTION     CESAREAN SECTION  05/13/2020   Procedure: CESAREAN SECTION;  Surgeon: Schermerhorn, Ihor Austin, MD;  Location: [facility] ORS;  Service: Obstetrics;;   DILATION AND CURETTAGE OF UTERUS     DILATION AND EVACUATION N/A 12/04/2021   Procedure: DILATATION AND EVACUATION;  Surgeon: Olathe Bing, MD;  Location: [facility];  Service: Gynecology;  Laterality: N/A;    Family History  Problem Relation Age of Onset   Hypertension Mother    Diabetes Maternal Grandmother    Hypertension Maternal Grandmother    Diabetes Maternal Grandfather    Hypertension Maternal Grandfather    Cancer Paternal Grandmother     Allergies  Allergen Reactions   Copper Swelling   Acetaminophen Other (See Comments)    Liver function    No current facility-administered medications on file prior to encounter.   Current Outpatient Medications on File Prior to Encounter  Medication Sig Dispense Refill   ADVAIR DISKUS 100-50 MCG/ACT AEPB 1 puff 2 (two) times  daily.     fluticasone (FLONASE) 50 MCG/ACT nasal spray Place 1 spray into both nostrils daily as needed for allergies.     Prenatal Vit-Fe Fumarate-FA (PRENATAL VITAMINS PO) Take 1 tablet by mouth daily.     albuterol (VENTOLIN HFA) 108 (90 Base) MCG/ACT inhaler Inhale 1 puff into the lungs every 6 (six) hours as needed for wheezing or shortness of breath.     ketoconazole (NIZORAL) 2 % shampoo Apply 1 application. topically 2 (two) times a week.     magnesium oxide (MAG-OX) 400 MG tablet Take 400 mg by mouth daily.       ROS Pertinent positives and negative per HPI, all others reviewed and negative  Physical Exam   BP (!) 143/72 (BP Location: Right Arm)   Pulse 82   Temp 98.1 F (36.7 C) (Oral)   Resp 16   Ht 5\' 4"  (1.626 m)   Wt 99.1 kg   LMP 01/05/2022   SpO2 100%   BMI 37.49 kg/m   Patient Vitals for the past 24 hrs:  BP Temp Temp src Pulse Resp SpO2 Height Weight  03/12/22 2246 (!) 143/72 98.1 F (36.7 C)  Oral 82 16 100 % 5\' 4"  (1.626 m) 99.1 kg    Physical Exam Vitals and nursing note reviewed. Exam conducted with a chaperone present.  Constitutional:      General: She is not in acute distress.    Appearance: Normal appearance.  HENT:     Head: Normocephalic and atraumatic.  Eyes:     General: No scleral icterus.    Conjunctiva/sclera: Conjunctivae normal.  Pulmonary:     Effort: Pulmonary effort is normal. No respiratory distress.  Abdominal:     Palpations: Abdomen is soft.     Tenderness: There is no abdominal tenderness. There is no guarding or rebound.  Genitourinary:    Comments: Cervix closed/thick NEFG Scant dark red blood Neurological:     Mental Status: She is alert.  Psychiatric:        Mood and Affect: Mood normal.        Behavior: Behavior normal.       Bedside Ultrasound Pt informed that the ultrasound is considered a limited OB ultrasound and is not intended to be a complete ultrasound exam.  Patient also informed that the ultrasound  is not being completed with the intent of assessing for fetal or placental anomalies or any pelvic abnormalities.  Explained that the purpose of today's ultrasound is to assess for  viability.  Patient acknowledges the purpose of the exam and the limitations of the study.     My interpretation: Live IUP with FHR 162 bpm    Labs No results found for this or any previous visit (from the past 24 hour(s)).  Imaging No results found.  MAU Course  Procedures Lab Orders  No laboratory test(s) ordered today   No orders of the defined types were placed in this encounter.  Imaging Orders  No imaging studies ordered today    MDM Minimal bleeding on exam & cervix closed. FHT present via BSUS (see documentation under physical exam).  Per review of records, patient is rh positive.  Assessment and Plan   1. Threatened miscarriage   2. Vaginal bleeding in pregnancy, first trimester   3. [redacted] weeks gestation of pregnancy    -Reviewed bleeding precautions & reasons to return to MAU -Pelvic rest -Start prenatal care  , NP 03/12/22 11:42 PM

## 2022-03-12 NOTE — Discharge Instructions (Signed)
Return to care  If you have heavier bleeding that soaks through more than 2 pads per hour for an hour or more If you bleed so much that you feel like you might pass out or you do pass out If you have significant abdominal pain that is not improved with Tylenol   

## 2022-03-12 NOTE — MAU Note (Signed)
..  Barbara Ortiz is a 31 y.o. at [redacted]w[redacted]d here in MAU reporting: vaginal bleeding that she noticed when she was in the shower, reports that it got on her underwear and she had to wipe a few times. Denies abdominal pain, reports back pain that is sore.   Reports she had a missed AB recently and is worried.   Pain score: 8/10 Vitals:   03/12/22 2246  BP: (!) 143/72  Pulse: 82  Resp: 16  Temp: 98.1 F (36.7 C)  SpO2: 100%     FHT:not indicated Lab orders placed from triage:

## 2022-03-22 ENCOUNTER — Ambulatory Visit: Payer: Medicaid Other | Admitting: Certified Nurse Midwife

## 2022-09-08 ENCOUNTER — Inpatient Hospital Stay (HOSPITAL_COMMUNITY)
Admission: AD | Admit: 2022-09-08 | Discharge: 2022-09-08 | Disposition: A | Discharge status: Home / Self Care | Payer: Medicaid Other | Attending: Obstetrics & Gynecology | Admitting: Obstetrics & Gynecology

## 2022-09-08 ENCOUNTER — Encounter (HOSPITAL_COMMUNITY): Payer: Self-pay | Admitting: Obstetrics & Gynecology

## 2022-09-08 DIAGNOSIS — O4703 False labor before 37 completed weeks of gestation, third trimester: Secondary | ICD-10-CM | POA: Insufficient documentation

## 2022-09-08 DIAGNOSIS — Z3A35 35 weeks gestation of pregnancy: Secondary | ICD-10-CM | POA: Diagnosis not present

## 2022-09-08 DIAGNOSIS — O26893 Other specified pregnancy related conditions, third trimester: Secondary | ICD-10-CM | POA: Diagnosis present

## 2022-09-08 DIAGNOSIS — R102 Pelvic and perineal pain: Secondary | ICD-10-CM | POA: Diagnosis present

## 2022-09-08 DIAGNOSIS — O479 False labor, unspecified: Secondary | ICD-10-CM | POA: Diagnosis not present

## 2022-09-08 DIAGNOSIS — O368130 Decreased fetal movements, third trimester, not applicable or unspecified: Secondary | ICD-10-CM | POA: Diagnosis present

## 2022-09-08 LAB — URINALYSIS, ROUTINE W REFLEX MICROSCOPIC
Bilirubin Urine: NEGATIVE
Glucose, UA: NEGATIVE mg/dL
Hgb urine dipstick: NEGATIVE
Ketones, ur: NEGATIVE mg/dL
Leukocytes,Ua: NEGATIVE
Nitrite: NEGATIVE
Protein, ur: NEGATIVE mg/dL
Specific Gravity, Urine: 1.021 (ref 1.005–1.030)
pH: 6 (ref 5.0–8.0)

## 2022-09-08 NOTE — MAU Provider Note (Signed)
History     CSN: [id number]  Arrival date and time: 09/08/22 1629   Event Date/Time   First Provider Initiated Contact with Patient 09/08/22 1759      Chief Complaint  Patient presents with   Back Pain   Abdominal Pain   HPI  [name] is a 32 y.o. female [phone] @ 19w1dhere in MAU with pelvic pressure and contractions. She also reports DFM. No history of preterm deliveries with previous pregnancies. No bleeding. Reports good fetal movement since arrival.   OB History     Gravida  10   Para  5   Term  5   Preterm  0   AB  4   Living  5      SAB  3   IAB  1   Ectopic  0   Multiple  0   Live Births  5           Past Medical History:  Diagnosis Date   ACL (anterior cruciate ligament) tear    Anemia    Anxiety    Asthma    Breast anomaly    repeated breast swelling, redness and bleeding from nipples not associated with breastfeeding   Bronchitis     Past Surgical History:  Procedure Laterality Date   CESAREAN SECTION     CESAREAN SECTION  05/13/2020   Procedure: CESAREAN SECTION;  Surgeon: Schermerhorn, TGwen Her MD;  Location: [facility] ORS;  Service: Obstetrics;;   DILATION AND CURETTAGE OF UTERUS     DILATION AND EVACUATION N/A 12/04/2021   Procedure: DILATATION AND EVACUATION;  Surgeon: PAletha Halim MD;  Location: [facility]  Service: Gynecology;  Laterality: N/A;    Family History  Problem Relation Age of Onset   Hypertension Mother    Diabetes Maternal Grandmother    Hypertension Maternal Grandmother    Diabetes Maternal Grandfather    Hypertension Maternal Grandfather    Cancer Paternal Grandmother     Social History   Tobacco Use   Smoking status: Never   Smokeless tobacco: Never  Vaping Use   Vaping Use: Never used  Substance Use Topics   Alcohol use: Not Currently    Alcohol/week: 0.0 standard drinks of alcohol   Drug use: No    Allergies:  Allergies  Allergen Reactions   Copper Swelling   Acetaminophen  Other (See Comments)    Liver function    Medications Prior to Admission  Medication Sig Dispense Refill Last Dose   ADVAIR DISKUS 100-50 MCG/ACT AEPB 1 puff 2 (two) times daily.   09/07/2022   Prenatal Vit-Fe Fumarate-FA (PRENATAL VITAMINS PO) Take 1 tablet by mouth daily.   09/07/2022   albuterol (VENTOLIN HFA) 108 (90 Base) MCG/ACT inhaler Inhale 1 puff into the lungs every 6 (six) hours as needed for wheezing or shortness of breath.      fluticasone (FLONASE) 50 MCG/ACT nasal spray Place 1 spray into both nostrils daily as needed for allergies.      ketoconazole (NIZORAL) 2 % shampoo Apply 1 application. topically 2 (two) times a week.      magnesium oxide (MAG-OX) 400 MG tablet Take 400 mg by mouth daily.       Results for orders placed or performed during the hospital encounter of 09/08/22 (from the past 48 hour(s))  Urinalysis, Routine w reflex microscopic -Urine, Clean Catch     Status: Abnormal   Collection Time: 09/08/22  5:35 PM  Result Value Ref Range  Color, Urine YELLOW YELLOW   APPearance HAZY (A) CLEAR   Specific Gravity, Urine 1.021 1.005 - 1.030   pH 6.0 5.0 - 8.0   Glucose, UA NEGATIVE NEGATIVE mg/dL   Hgb urine dipstick NEGATIVE NEGATIVE   Bilirubin Urine NEGATIVE NEGATIVE   Ketones, ur NEGATIVE NEGATIVE mg/dL   Protein, ur NEGATIVE NEGATIVE mg/dL   Nitrite NEGATIVE NEGATIVE   Leukocytes,Ua NEGATIVE NEGATIVE    Comment: Performed at [facility] [address], [place], [place] [address]     Review of Systems  Constitutional:  Negative for fever.  Gastrointestinal:  Positive for abdominal pain.  Genitourinary:  Negative for vaginal bleeding and vaginal discharge.   Physical Exam   Blood pressure (!) 112/47, pulse 78, temperature 98.2 F (36.8 C), temperature source Oral, resp. rate 20, height '5\' 3"'$  (1.6 m), weight 104.6 kg, last menstrual period 01/05/2022, SpO2 99 %, unknown if currently breastfeeding.  Physical Exam Constitutional:      General:  She is not in acute distress.    Appearance: She is well-developed. She is not ill-appearing, toxic-appearing or diaphoretic.  HENT:     Head: Normocephalic.  Abdominal:     Tenderness: There is no abdominal tenderness.  Genitourinary:    Comments: Dilation: Closed Exam by:: Noni Saupe, NP  Skin:    General: Skin is warm.     Capillary Refill: Capillary refill takes more than 3 seconds.  Neurological:     Mental Status: She is alert.    Fetal Tracing:  Baseline: 130 bpm Variability: Moderate  Accelerations: 15x15 Decelerations: None  Toco: Occasional  MAU Course  Procedures  MDM  UA Cervix long and closed. Good fetal movement.  Assessment and Plan   A:  1. Braxton Hick's contraction   2. [redacted] weeks gestation of pregnancy      P:  Dc home  Return to MAU if symptoms worsen Preterm labor precautions reviewed  Lezlie Lye, NP 09/08/2022 7:07 PM

## 2022-09-08 NOTE — MAU Note (Signed)
Barbara Ortiz is a 32 y.o. at 59w1dhere in MAU reporting: started yesterday.  Feeling pressure in her butt, pain in her lower back and vagina. Called, wanting a dr appt, didn't have a ride, so was told to just come here. Was going to try to wait until appt next wk.  Woke up feeling pressure again, feeling a little moisture at times in her underwear, none now. Is not constipated, having regular BM's. No sugar or BP problems this preg.  Does have "extra fluid". No bleeding.  Baby is moving, but is less. Onset of complaint: started yesterday Pain score: back 8/abd 8/rectum 10 Vitals:   09/08/22 1654  BP: 137/72  Pulse: 83  Resp: 20  Temp: 98.2 F (36.8 C)  SpO2: 100%     FHT:155 Lab orders placed from triage:  urine

## 2022-11-10 ENCOUNTER — Emergency Department (HOSPITAL_BASED_OUTPATIENT_CLINIC_OR_DEPARTMENT_OTHER): Payer: Medicaid Other

## 2022-11-10 ENCOUNTER — Other Ambulatory Visit: Payer: Self-pay

## 2022-11-10 ENCOUNTER — Emergency Department (HOSPITAL_BASED_OUTPATIENT_CLINIC_OR_DEPARTMENT_OTHER)
Admission: EM | Admit: 2022-11-10 | Discharge: 2022-11-10 | Disposition: A | Discharge status: Home / Self Care | Payer: Medicaid Other | Attending: Emergency Medicine | Admitting: Emergency Medicine

## 2022-11-10 DIAGNOSIS — R1031 Right lower quadrant pain: Secondary | ICD-10-CM | POA: Diagnosis not present

## 2022-11-10 DIAGNOSIS — R109 Unspecified abdominal pain: Secondary | ICD-10-CM | POA: Diagnosis present

## 2022-11-10 LAB — URINALYSIS, ROUTINE W REFLEX MICROSCOPIC
Bilirubin Urine: NEGATIVE
Glucose, UA: NEGATIVE mg/dL
Ketones, ur: NEGATIVE mg/dL
Leukocytes,Ua: NEGATIVE
Nitrite: NEGATIVE
Protein, ur: NEGATIVE mg/dL
Specific Gravity, Urine: 1.005 (ref 1.005–1.030)
pH: 7.5 (ref 5.0–8.0)

## 2022-11-10 LAB — COMPREHENSIVE METABOLIC PANEL
ALT: 12 U/L (ref 0–44)
AST: 13 U/L — ABNORMAL LOW (ref 15–41)
Albumin: 4.3 g/dL (ref 3.5–5.0)
Alkaline Phosphatase: 66 U/L (ref 38–126)
Anion gap: 9 (ref 5–15)
BUN: 6 mg/dL (ref 6–20)
CO2: 25 mmol/L (ref 22–32)
Calcium: 9.1 mg/dL (ref 8.9–10.3)
Chloride: 101 mmol/L (ref 98–111)
Creatinine, Ser: 0.54 mg/dL (ref 0.44–1.00)
GFR, Estimated: >60 mL/min (ref >60)
Glucose, Bld: 82 mg/dL (ref 70–99)
Potassium: 3.7 mmol/L (ref 3.5–5.1)
Sodium: 135 mmol/L (ref 135–145)
Total Bilirubin: 0.3 mg/dL (ref 0.3–1.2)
Total Protein: 7.6 g/dL (ref 6.5–8.1)

## 2022-11-10 LAB — CBC
HCT: 35.8 % — ABNORMAL LOW (ref 36.0–46.0)
Hemoglobin: 11.7 g/dL — ABNORMAL LOW (ref 12.0–15.0)
MCH: 26.2 pg (ref 26.0–34.0)
MCHC: 32.7 g/dL (ref 30.0–36.0)
MCV: 80.3 fL (ref 80.0–100.0)
Platelets: 281 10*3/uL (ref 150–400)
RBC: 4.46 MIL/uL (ref 3.87–5.11)
RDW: 17 % — ABNORMAL HIGH (ref 11.5–15.5)
WBC: 7.7 10*3/uL (ref 4.0–10.5)
nRBC: 0 % (ref 0.0–0.2)

## 2022-11-10 LAB — LIPASE, BLOOD: Lipase: 37 U/L (ref 11–51)

## 2022-11-10 MED ORDER — ONDANSETRON HCL 4 MG/2ML IJ SOLN
4.0000 mg | Freq: Once | INTRAMUSCULAR | Status: AC
  Administered 2022-11-10: 4 mg via INTRAVENOUS
  Filled 2022-11-10: qty 2

## 2022-11-10 MED ORDER — IOHEXOL 300 MG/ML  SOLN
100.0000 mL | Freq: Once | INTRAMUSCULAR | Status: AC | PRN
  Administered 2022-11-10: 100 mL via INTRAVENOUS

## 2022-11-10 MED ORDER — KETOROLAC TROMETHAMINE 15 MG/ML IJ SOLN
INTRAMUSCULAR | Status: AC
  Administered 2022-11-10: 15 mg via INTRAVENOUS
  Filled 2022-11-10: qty 1

## 2022-11-10 MED ORDER — MORPHINE SULFATE (PF) 4 MG/ML IV SOLN
4.0000 mg | Freq: Once | INTRAVENOUS | Status: AC
  Administered 2022-11-10: 4 mg via INTRAVENOUS
  Filled 2022-11-10: qty 1

## 2022-11-10 MED ORDER — KETOROLAC TROMETHAMINE 15 MG/ML IJ SOLN: 15.0000 mg | Freq: Once | INTRAMUSCULAR | Status: AC

## 2022-11-10 MED ORDER — KETOROLAC TROMETHAMINE 15 MG/ML IJ SOLN: 15.0000 mg | Freq: Once | INTRAMUSCULAR | Status: DC

## 2022-11-10 NOTE — ED Provider Notes (Signed)
[name] EMERGENCY DEPARTMENT AT [facility] Provider Note   CSN: [id number] Arrival date & time: 11/10/22  1611     History  Chief Complaint  Patient presents with   Flank Pain    Barbara Ortiz is a 32 y.o. female 6 weeks postpartum from a vaginal delivery at [facility] presenting today with right side pain.  Patient reports that she woke up this morning was having some pain in the right hip.  She thought it might be constipation so she took multiple laxatives.  She started to feel like she needed to have a bowel movement and when she got to the bathroom she had multiple episodes of emesis.  She says that the pain is in the right side of her back, around her head and around to her RLQ.  6 weeks postpartum from a vaginal delivery without complications.  No history of bowel obstruction.  Has not had a menstrual period since delivery.  No urinary or vaginal symptoms   Flank Pain       Home Medications Prior to Admission medications   Medication Sig Start Date End Date Taking? Authorizing Provider  ADVAIR DISKUS 100-50 MCG/ACT AEPB 1 puff 2 (two) times daily. 11/08/21   [provider]  albuterol (VENTOLIN HFA) 108 (90 Base) MCG/ACT inhaler Inhale 1 puff into the lungs every 6 (six) hours as needed for wheezing or shortness of breath.    [provider]  fluticasone (FLONASE) 50 MCG/ACT nasal spray Place 1 spray into both nostrils daily as needed for allergies. 11/02/21   [provider]  ketoconazole (NIZORAL) 2 % shampoo Apply 1 application. topically 2 (two) times a week. 09/06/21   [provider]  magnesium oxide (MAG-OX) 400 MG tablet Take 400 mg by mouth daily.    [provider]  Prenatal Vit-Fe Fumarate-FA (PRENATAL VITAMINS PO) Take 1 tablet by mouth daily.    [provider]      Allergies    Copper and Acetaminophen    Review of Systems   Review of Systems  Constitutional:  Negative for chills and fever.   Gastrointestinal:  Positive for nausea and vomiting. Negative for blood in stool, constipation and diarrhea.  Genitourinary:  Positive for flank pain.    Physical Exam Updated Vital Signs BP (!) 144/88 (BP Location: Right Arm)   Pulse 67   Temp 98.4 F (36.9 C)   Resp 15   LMP 01/05/2022   SpO2 100%  Physical Exam Vitals and nursing note reviewed.  Constitutional:      Appearance: Normal appearance.  HENT:     Head: Normocephalic and atraumatic.  Eyes:     General: No scleral icterus.    Conjunctiva/sclera: Conjunctivae normal.  Pulmonary:     Effort: Pulmonary effort is normal. No respiratory distress.  Abdominal:     Tenderness: There is right CVA tenderness. There is no left CVA tenderness.  Skin:    Findings: No rash.  Neurological:     Mental Status: She is alert.  Psychiatric:        Mood and Affect: Mood normal.     ED Results / Procedures / Treatments   Labs (all labs ordered are listed, but only abnormal results are displayed) Labs Reviewed  COMPREHENSIVE METABOLIC PANEL - Abnormal; Notable for the following components:      Result Value   AST 13 (*)    All other components within normal limits  CBC - Abnormal; Notable for the following components:  Hemoglobin 11.7 (*)    HCT 35.8 (*)    RDW 17.0 (*)    All other components within normal limits  URINALYSIS, ROUTINE W REFLEX MICROSCOPIC - Abnormal; Notable for the following components:   APPearance HAZY (*)    Hgb urine dipstick TRACE (*)    Bacteria, UA RARE (*)    All other components within normal limits  LIPASE, BLOOD    EKG None  Radiology No results found.  Procedures Procedures  {Document cardiac monitor, telemetry assessment procedure when appropriate:1}  Medications Ordered in ED Medications - No data to display  ED Course/ Medical Decision Making/ A&P Clinical Course as of 11/10/22 [name] Nov 10, 2022  1837 Spoke with [name], APP at the [facility]U.  She says that they often use  Toradol and morphine as there is very minimal crossing into the breastmilk.  Will continue with Toradol [MR]    Clinical Course User Index [MR] [name] A, PA-C   {   Click here for ABCD2, HEART and other calculatorsREFRESH Note before signing :1}                          Medical Decision Making Amount and/or Complexity of Data Reviewed Labs: ordered. Radiology: ordered.  Risk Prescription drug management.   32 year old female presenting today with right sided flank and abdominal pain.  Differential includes but is not limited to nephrolithiasis, pyelonephritis, UTI, ovarian cyst, ovarian torsion, appendicitis, cholecystitis.   This is not an exhaustive differential.    Past Medical History / Co-morbidities / Social History: 6 weeks post partum     Physical Exam: Pertinent physical exam findings include Periumbilical and rlq discomfort CVA+ right sided  Lab Tests: Unremarkable   Imaging Studies: CTAP:  Medications: Zofran and Toradol.  No improvement, morphine ordered and patient's pain was relieved   Consultations Obtained: I spoke with [name] at the [facility]U about my medication choices  MDM/Disposition: This is a ***  After patient's work-up today, I feel that *** .     I discussed this case with my attending physician [name] who cosigned this note including patient's presenting symptoms, physical exam, and planned diagnostics and interventions. Attending physician stated agreement with plan or made changes to plan which were implemented.     {Document critical care time when appropriate:1} {Document review of labs and clinical decision tools ie heart score, Chads2Vasc2 etc:1}  {Document your independent review of radiology images, and any outside records:1} {Document your discussion with family members, caretakers, and with consultants:1} {Document social determinants of health affecting pt's care:1} {Document your decision making why or why not  admission, treatments were needed:1} Final Clinical Impression(s) / ED Diagnoses Final diagnoses:  None    Rx / DC Orders ED Discharge Orders     None

## 2022-11-10 NOTE — Discharge Instructions (Addendum)
You came to the emergency department today with pain to your abdomen and some vomiting.  Your lab work and CAT scan are normal.  I suspect he may have upset your stomach taking the laxatives.  If you continue to have GI problems please see your PCP who can refer you to a gastroenterologist.  I have attached your work note to these discharge papers.  It was a pleasure to meet you and we hope you feel better!

## 2022-11-10 NOTE — ED Notes (Signed)
Patient transported to CT 

## 2022-11-10 NOTE — ED Triage Notes (Signed)
Complains of flank bilateral, into groin. Some lower back pain into right leg. Emesis x1. All starting today. Does not report any urinary changes. Denies CP and SOB.   6 weeks post partum.

## 2022-11-10 NOTE — ED Notes (Signed)
RN reviewed discharge instructions with pt. Pt verbalized understanding and had no further questions. VSS upon discharge.  

## 2023-02-17 IMAGING — US US OB < 14 WEEKS - US OB TV
1 series · 15 of 28 positions shown · non-contrast
Comparison: None Available.

CLINICAL DATA: Back pain and right lower quadrant pain.

EXAM:
OBSTETRIC <14 WK US AND TRANSVAGINAL OB US
TECHNIQUE: Both transabdominal and transvaginal ultrasound examinations were
performed for complete evaluation of the gestation as well as the
maternal uterus, adnexal regions, and pelvic cul-de-sac.
Transvaginal technique was performed to assess early pregnancy.

[Series 1: us ob < 14 weeks - us ob tv · 15 of 68 slices shown]
[im 1/68]
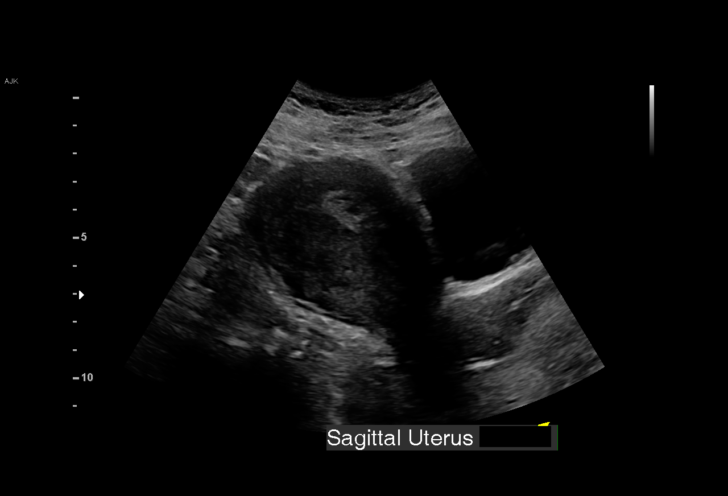
[im 5/68]
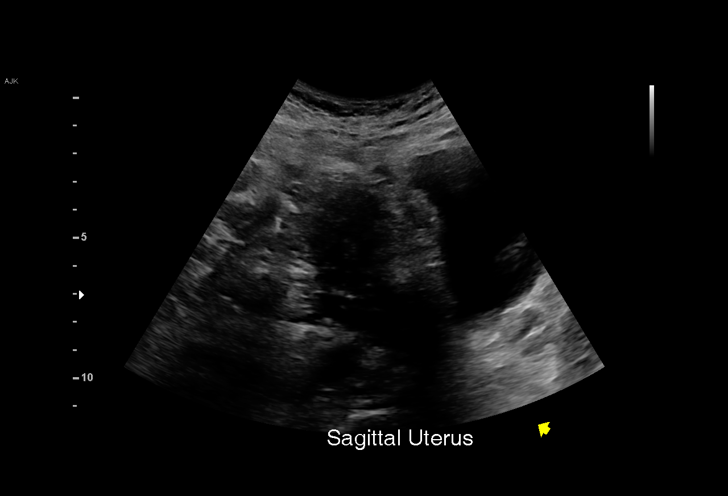
[im 10/68]
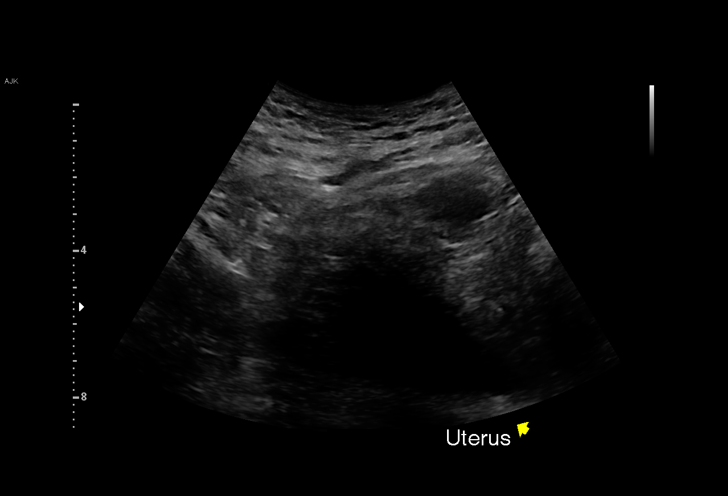
[im 15/68]
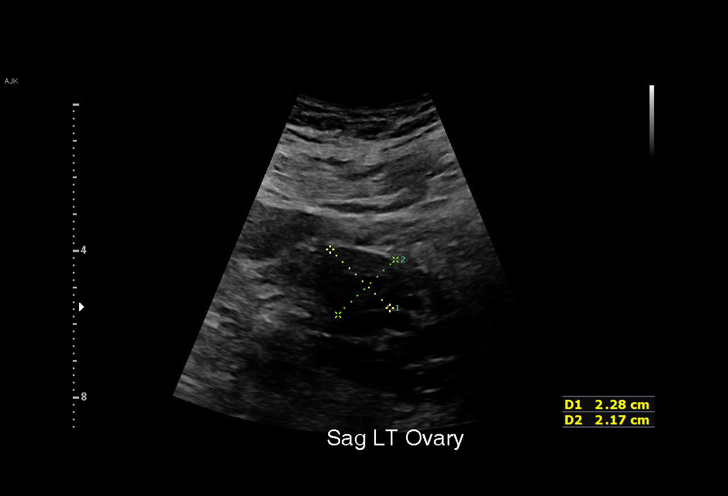
[im 20/68]
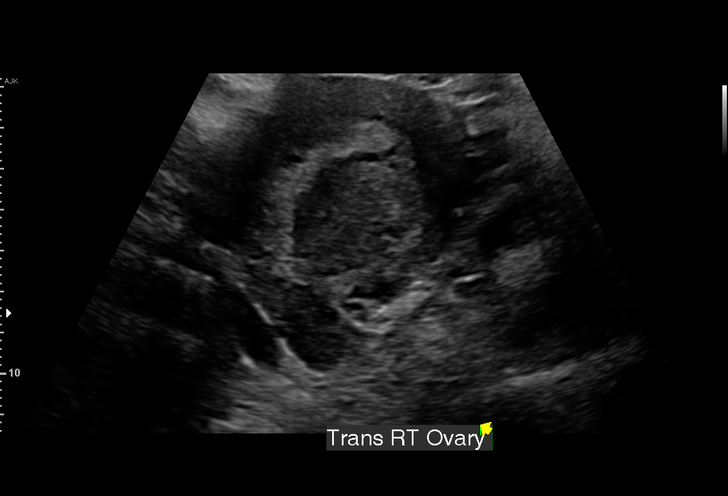
[im 25/68]
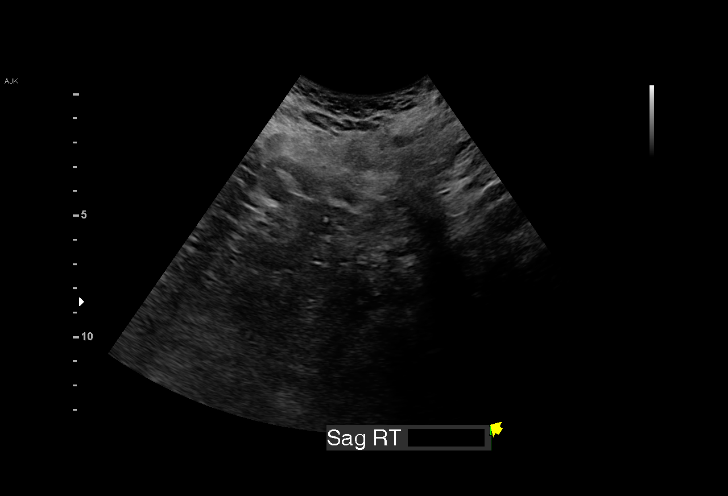
[im 30/68]
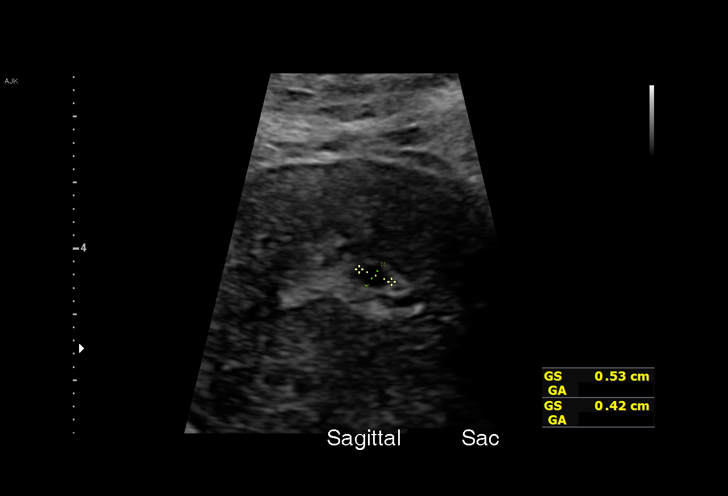
[im 35/68]
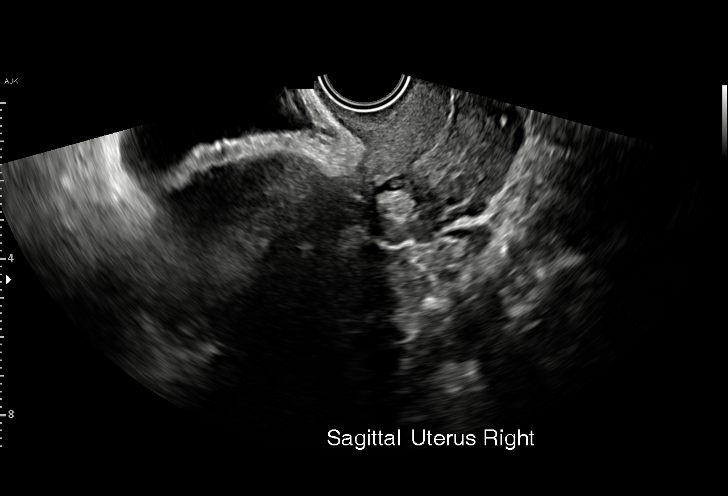
[im 38/68]
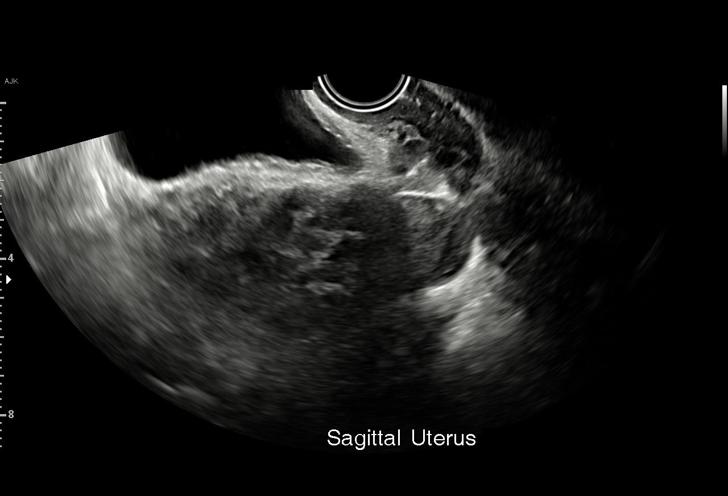
[im 43/68]
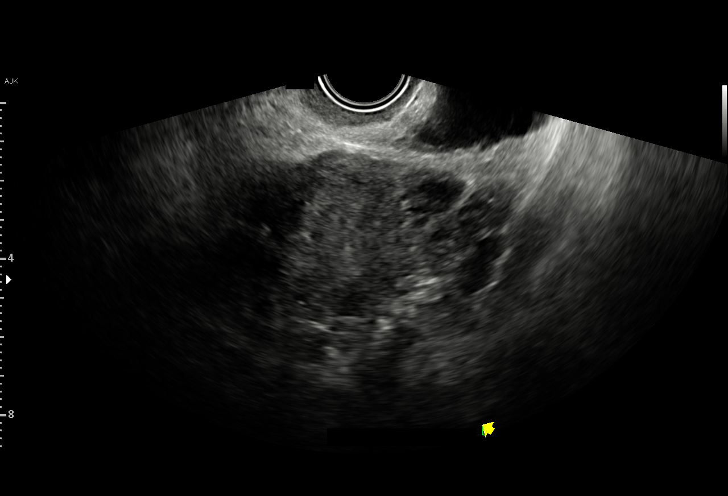
[im 48/68]
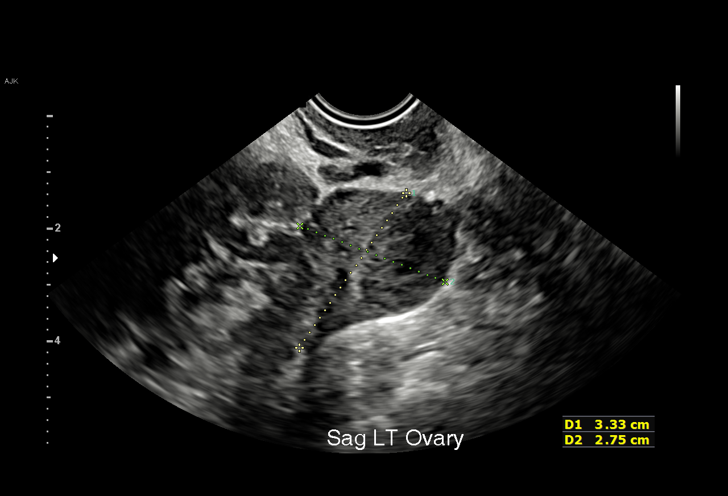
[im 53/68]
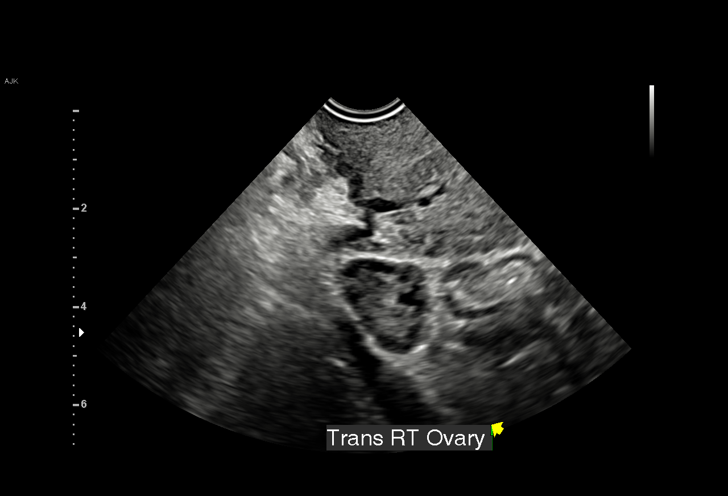
[im 58/68]
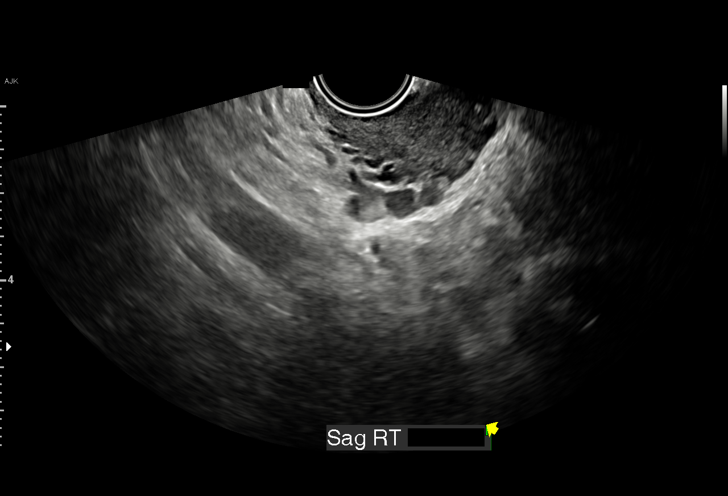
[im 63/68]
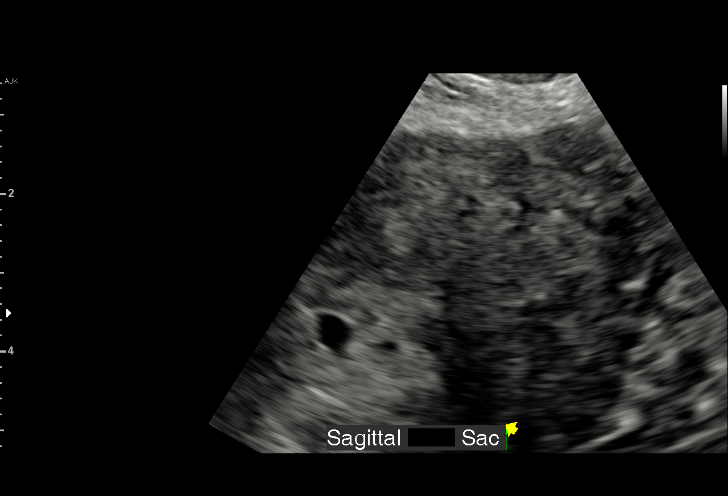
[im 68/68]
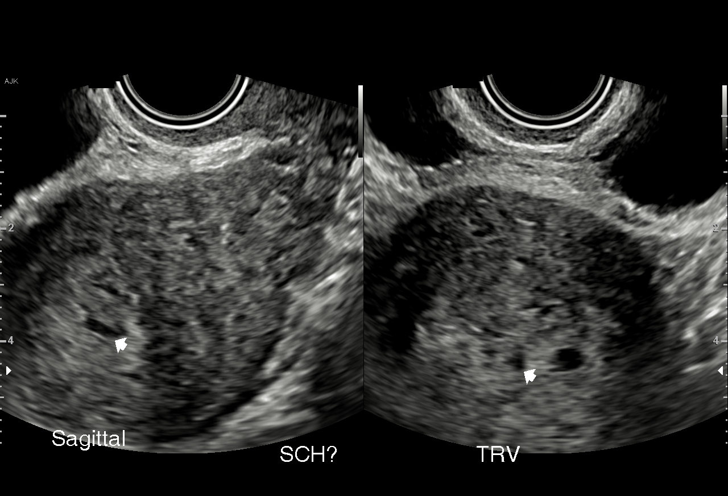

[15 of 28 positions shown; findings below may reference images not displayed]

FINDINGS: Intrauterine gestational sac: Single

Yolk sac:  Not Visualized.

Embryo:  Not Visualized.

MSD: 5.3 mm   5 w   2 d

Subchorionic hemorrhage:  None visualized.

Maternal uterus/adnexae: The bilateral ovaries appear within normal
limits. There is trace free fluid in the pelvis.
IMPRESSION: 1. Probable early intrauterine gestational sac, but no yolk sac,
fetal pole, or cardiac activity yet visualized. Recommend follow-up
quantitative B-HCG levels and follow-up US in 14 days to assess
viability. This recommendation follows SRU consensus guidelines:
Diagnostic Criteria for Nonviable Pregnancy Early in the First
Trimester. N Engl J Med 1326; [DATE].
2. Trace free fluid in the pelvis.

## 2023-02-19 IMAGING — US US OB TRANSVAGINAL
1 series · 15 of 28 positions shown · non-contrast
Comparison: None Available.

CLINICAL DATA: Abdominal pain.  Pregnant patient.

EXAM:
TWIN OBSTETRIC <14WK US AND TRANSVAGINAL OB US
TECHNIQUE: Both transabdominal and transvaginal ultrasound examinations were
performed for complete evaluation of the gestation as well as the
maternal uterus, adnexal regions, and pelvic cul-de-sac.
Transvaginal technique was performed to assess early pregnancy.

[Series 1: us ob transvaginal · 15 of 41 slices shown]
[im 1/41]
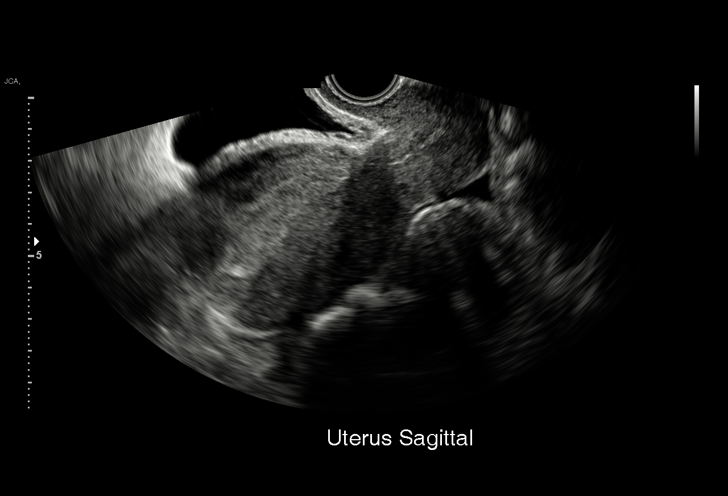
[im 3/41]
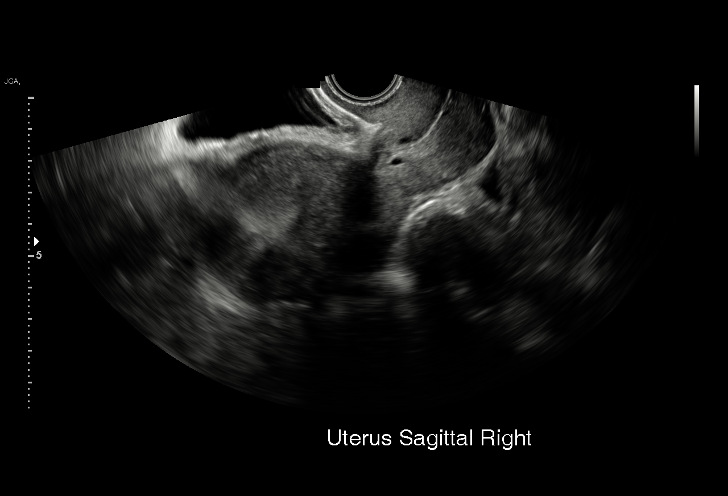
[im 6/41]
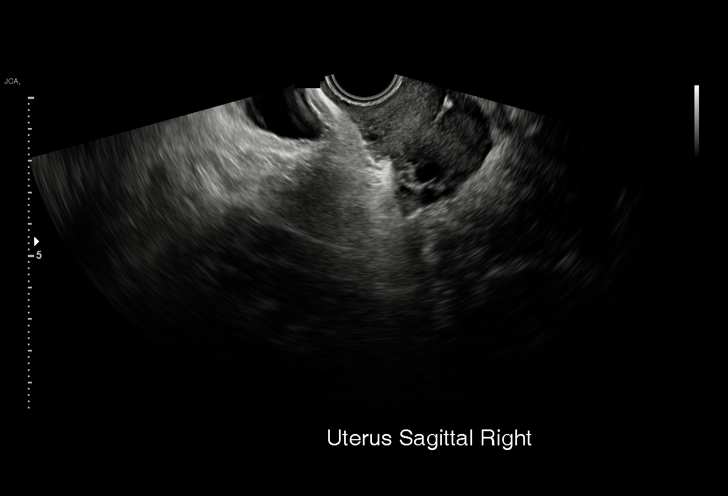
[im 9/41]
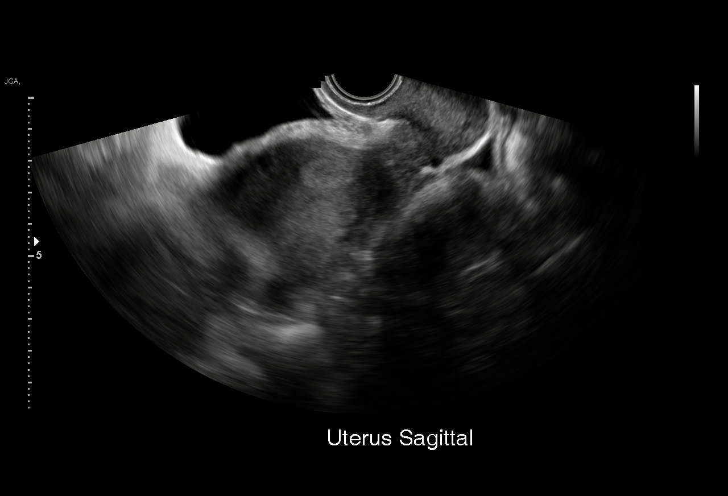
[im 12/41]
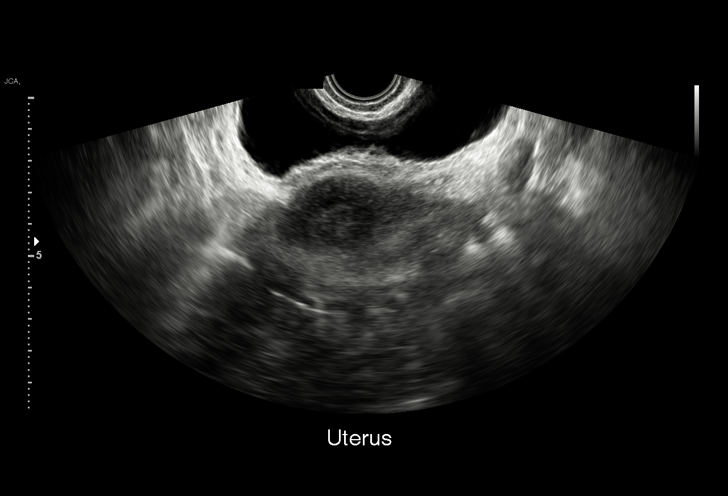
[im 15/41]
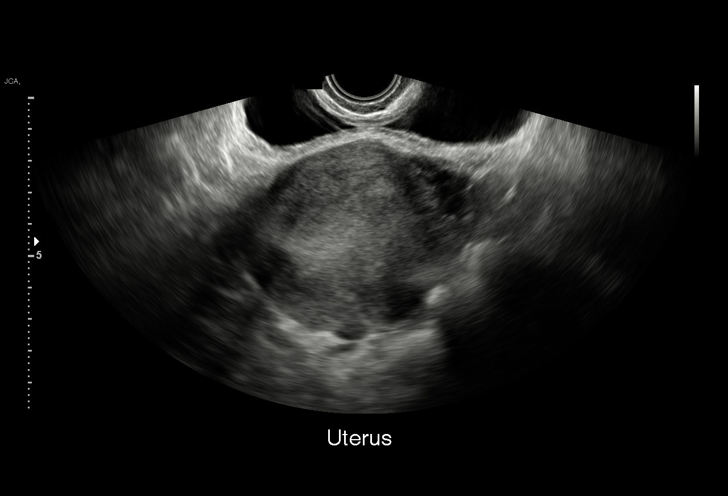
[im 18/41]
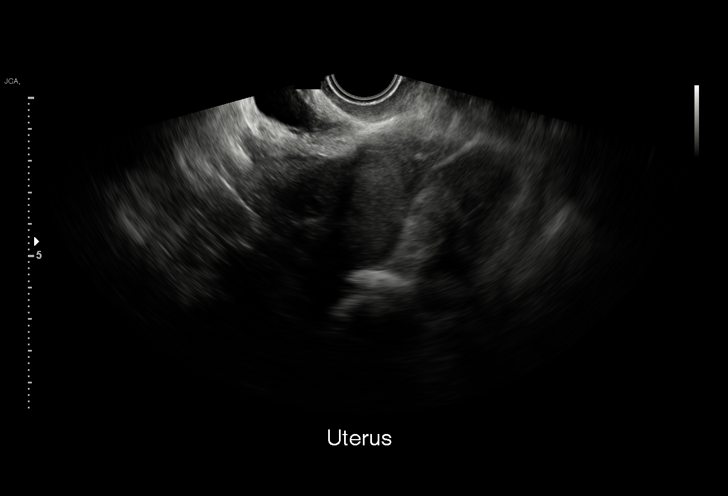
[im 21/41]
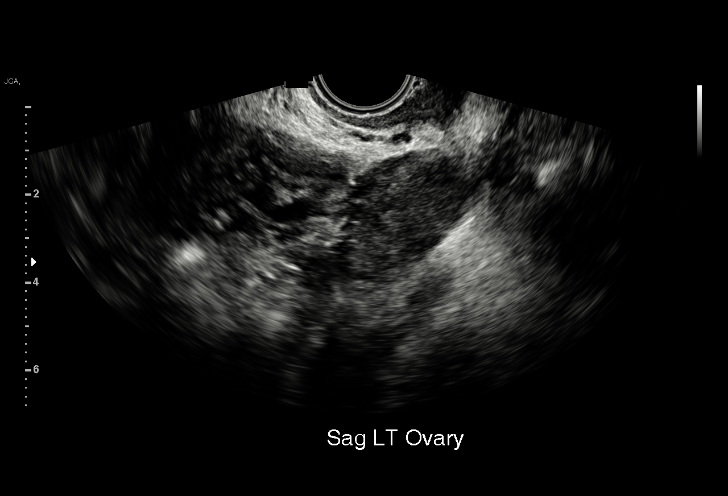
[im 23/41]
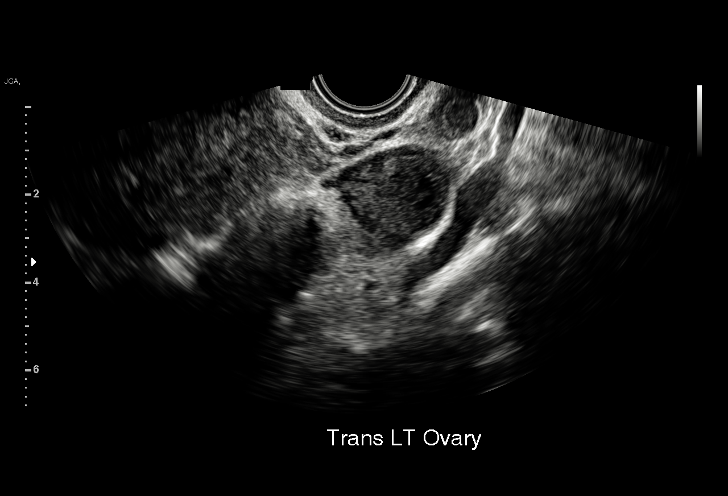
[im 26/41]
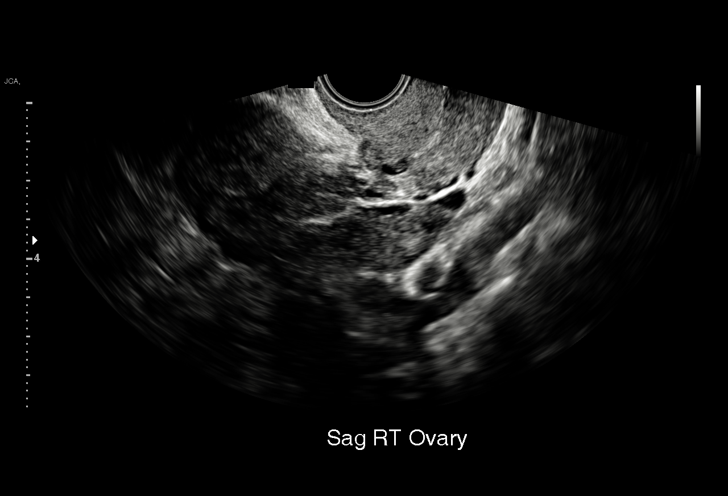
[im 29/41]
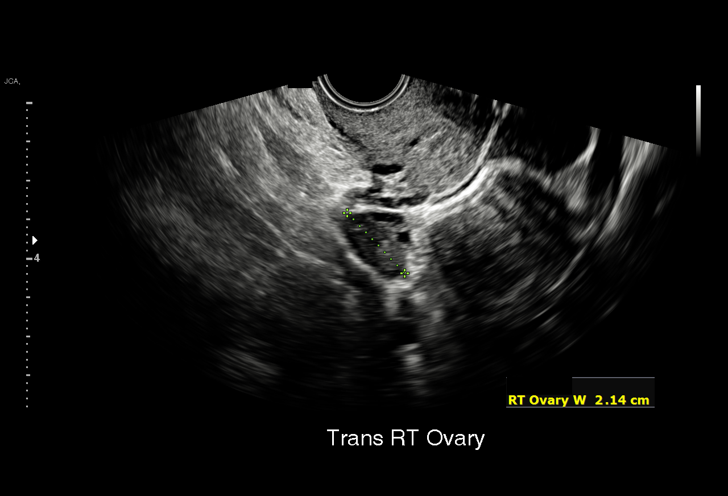
[im 32/41]
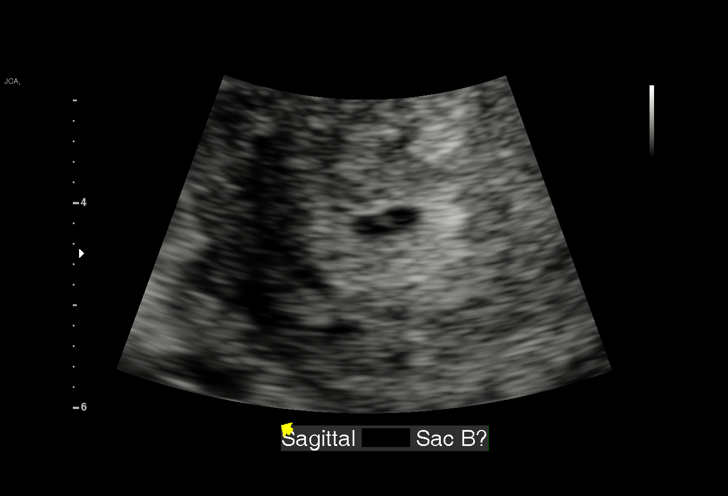
[im 35/41]
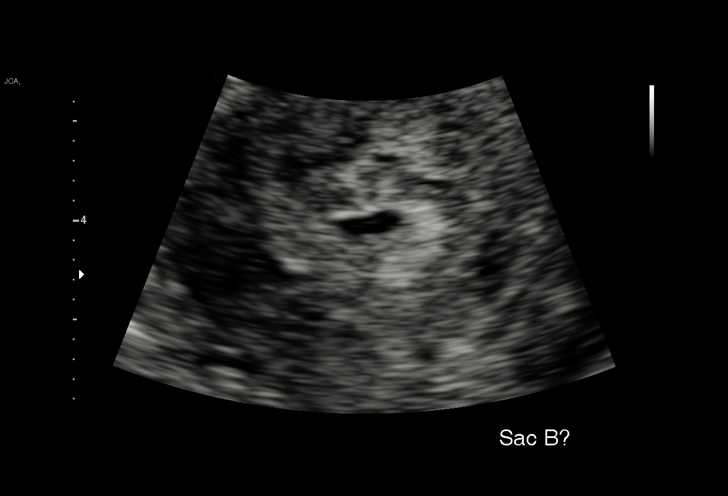
[im 38/41]
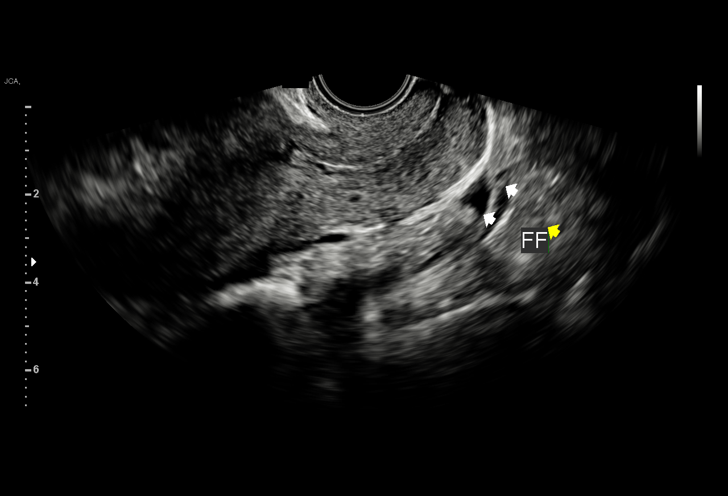
[im 41/41]
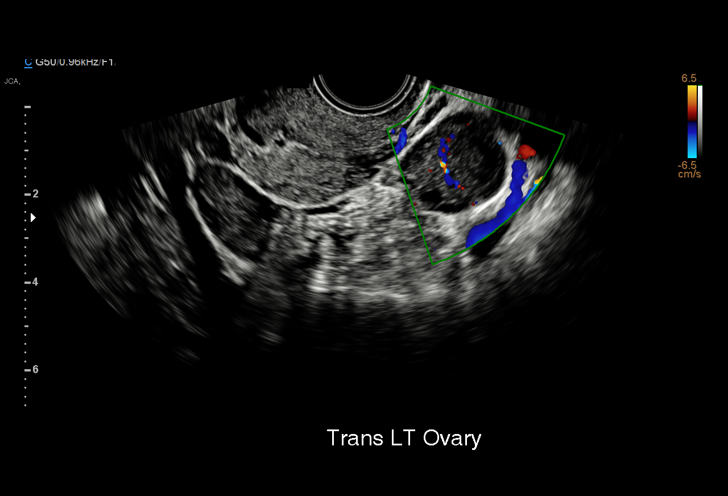

[15 of 28 positions shown; findings below may reference images not displayed]

FINDINGS: Number of IUPs:  2

Chorionicity/Amnionicity:  Dichorionic-diamniotic (thick membrane)

TWIN 1

Yolk sac:  Not Visualized.

Embryo:  Not Visualized.

MSD: 4.4 mm   5 w   1 d

CRL:    mm    w  d                  US EDC:

TWIN 2

Yolk sac:  Not Visualized.

Embryo:  Not Visualized.

MSD: 4.3 mm   5 w   1 d

CRL:    mm    w  d                  US EDC:

Subchorionic hemorrhage:  None visualized.

Maternal uterus/adnexae: The ovaries are unremarkable. A small
amount of fluid in the pelvis is likely physiologic.
IMPRESSION: Probable early intrauterine gestational sacs, but no yolk sacs,
fetal poles, or cardiac activity yet visualized. Recommend follow-up
quantitative B-HCG levels and follow-up US in 14 days to assess
viability. This recommendation follows SRU consensus guidelines:
Diagnostic Criteria for Nonviable Pregnancy Early in the First
Trimester. N Engl J Med 6509; [DATE].

## 2023-08-14 ENCOUNTER — Emergency Department (HOSPITAL_BASED_OUTPATIENT_CLINIC_OR_DEPARTMENT_OTHER)
Admission: EM | Admit: 2023-08-14 | Discharge: 2023-08-15 | Disposition: A | Discharge status: Home / Self Care | Payer: Medicaid Other | Attending: Emergency Medicine | Admitting: Emergency Medicine

## 2023-08-14 ENCOUNTER — Other Ambulatory Visit: Payer: Self-pay

## 2023-08-14 ENCOUNTER — Encounter (HOSPITAL_BASED_OUTPATIENT_CLINIC_OR_DEPARTMENT_OTHER): Payer: Self-pay | Admitting: Emergency Medicine

## 2023-08-14 DIAGNOSIS — Z7951 Long term (current) use of inhaled steroids: Secondary | ICD-10-CM | POA: Diagnosis not present

## 2023-08-14 DIAGNOSIS — J101 Influenza due to other identified influenza virus with other respiratory manifestations: Secondary | ICD-10-CM | POA: Diagnosis not present

## 2023-08-14 DIAGNOSIS — Z20822 Contact with and (suspected) exposure to covid-19: Secondary | ICD-10-CM | POA: Diagnosis not present

## 2023-08-14 DIAGNOSIS — R059 Cough, unspecified: Secondary | ICD-10-CM | POA: Diagnosis present

## 2023-08-14 DIAGNOSIS — J45909 Unspecified asthma, uncomplicated: Secondary | ICD-10-CM | POA: Insufficient documentation

## 2023-08-14 DIAGNOSIS — R062 Wheezing: Secondary | ICD-10-CM

## 2023-08-14 MED ORDER — IPRATROPIUM-ALBUTEROL 0.5-2.5 (3) MG/3ML IN SOLN
3.0000 mL | Freq: Once | RESPIRATORY_TRACT | Status: AC
  Administered 2023-08-15: 3 mL via RESPIRATORY_TRACT
  Filled 2023-08-14: qty 3

## 2023-08-14 MED ORDER — DEXAMETHASONE SODIUM PHOSPHATE 10 MG/ML IJ SOLN
10.0000 mg | Freq: Once | INTRAMUSCULAR | Status: DC
  Filled 2023-08-14: qty 1

## 2023-08-14 NOTE — ED Triage Notes (Addendum)
Pt presents to the ED today for chest pain x1 month. Pt states her chest pain is worse tonight and she has chest tightness. Pt states she was treated last month for a URI with steriods. Pt states she developed a cough and fever (101.4F) today. Pt took tylenol for fever. Pt also used her rescue inhaler for chest tightness with minimal relief, pt has a history of asthma. Audible expiratory wheezing and cough during triage.

## 2023-08-15 ENCOUNTER — Emergency Department (HOSPITAL_BASED_OUTPATIENT_CLINIC_OR_DEPARTMENT_OTHER): Payer: Medicaid Other

## 2023-08-15 LAB — RESP PANEL BY RT-PCR (RSV, FLU A&B, COVID)  RVPGX2
Influenza A by PCR: POSITIVE — AB
Influenza B by PCR: NEGATIVE
Resp Syncytial Virus by PCR: NEGATIVE
SARS Coronavirus 2 by RT PCR: NEGATIVE

## 2023-08-15 MED ORDER — ALBUTEROL SULFATE HFA 108 (90 BASE) MCG/ACT IN AERS
2.0000 | INHALATION_SPRAY | Freq: Once | RESPIRATORY_TRACT | Status: AC
  Administered 2023-08-15: 2 via RESPIRATORY_TRACT
  Filled 2023-08-15: qty 6.7

## 2023-08-15 MED ORDER — DEXAMETHASONE SODIUM PHOSPHATE 10 MG/ML IJ SOLN
10.0000 mg | Freq: Once | INTRAMUSCULAR | Status: AC
  Administered 2023-08-15: 10 mg via INTRAVENOUS

## 2023-08-15 NOTE — Discharge Instructions (Signed)
You were seen today for upper respiratory symptoms.  You tested positive for the flu.  Make sure that you are staying hydrated.  Take Tylenol or Motrin for any body aches or pains.  Use your inhaler as needed.

## 2023-08-15 NOTE — ED Provider Notes (Signed)
[place] EMERGENCY DEPARTMENT AT [facility] Provider Note   CSN: [id number] Arrival date & time: 08/14/23  2327     History  Chief Complaint  Patient presents with   Cough   Chest Pain   Shortness of Breath    Barbara Ortiz is a 33 y.o. female.  HPI     This is a 33 year old female who presents with chest pain, cough, fever.  Patient states that she has had ongoing issues with chest pain over the last 1 month or so.  She states at times she has difficulty taking a deep breath.  She describes it as tightness.  Over the last several days she has developed cough and fever up to 101.5.  She took Tylenol.  She does have a history of asthma but is out of her Advair.  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  Home Medications Prior to Admission medications   Medication Sig Start Date End Date Taking? Authorizing Provider  ADVAIR DISKUS 100-50 MCG/ACT AEPB 1 puff 2 (two) times daily. 11/08/21   [provider]  albuterol (VENTOLIN HFA) 108 (90 Base) MCG/ACT inhaler Inhale 1 puff into the lungs every 6 (six) hours as needed for wheezing or shortness of breath.    [provider]  fluticasone (FLONASE) 50 MCG/ACT nasal spray Place 1 spray into both nostrils daily as needed for allergies. 11/02/21   [provider]  ketoconazole (NIZORAL) 2 % shampoo Apply 1 application. topically 2 (two) times a week. 09/06/21   [provider]  magnesium oxide (MAG-OX) 400 MG tablet Take 400 mg by mouth daily.    [provider]  Prenatal Vit-Fe Fumarate-FA (PRENATAL VITAMINS PO) Take 1 tablet by mouth daily.    [provider]      Allergies    Copper and Acetaminophen    Review of Systems   Review of Systems  Constitutional:  Positive for fever.  Respiratory:  Positive for cough, chest tightness and wheezing.   Cardiovascular:  Negative for leg swelling.  All other systems reviewed and are  negative.   Physical Exam Updated Vital Signs BP 101/80   Pulse 81   Temp 98.2 F (36.8 C) (Oral)   Resp 20   Ht 1.6 m (5\' 3" )   Wt 100.2 kg   LMP 08/11/2023 (Exact Date)   SpO2 100%   BMI 39.15 kg/m  Physical Exam Vitals and nursing note reviewed.  Constitutional:      Appearance: She is well-developed. She is not ill-appearing.  HENT:     Head: Normocephalic and atraumatic.  Eyes:     Pupils: Pupils are equal, round, and reactive to light.  Cardiovascular:     Rate and Rhythm: Normal rate and regular rhythm.     Heart sounds: Normal heart sounds.  Pulmonary:     Effort: Pulmonary effort is normal. No respiratory distress.     Breath sounds: Wheezing present.     Comments: Wheezing mostly with coughing Chest:     Chest wall: Tenderness present.  Abdominal:     General: Bowel sounds are normal.     Palpations: Abdomen is soft.  Musculoskeletal:     Cervical back: Neck supple.  Skin:    General: Skin is warm and dry.  Neurological:     General: No focal deficit present.     Mental Status: She is alert and oriented to person, place, and time.     ED Results /  Procedures / Treatments   Labs (all labs ordered are listed, but only abnormal results are displayed) Labs Reviewed  RESP PANEL BY RT-PCR (RSV, FLU A&B, COVID)  [id number] - Abnormal; Notable for the following components:      Result Value   Influenza A by PCR POSITIVE (*)    All other components within normal limits    EKG EKG Interpretation Date/Time:  Tuesday August 14 2023 23:47:14 EST Ventricular Rate:  78 PR Interval:  167 QRS Duration:  109 QT Interval:  378 QTC Calculation: 431 R Axis:   42  Text Interpretation: Sinus rhythm Borderline T wave abnormalities Confirmed by Ross Marcus ([id number]) on 08/15/2023 12:33:55 AM  Radiology DG Chest Portable 1 View Result Date: 08/15/2023 CLINICAL DATA:  Chest pain for 1 month EXAM: PORTABLE CHEST 1 VIEW COMPARISON:  09/19/2021 FINDINGS: The heart  size and mediastinal contours are within normal limits. Both lungs are clear. The visualized skeletal structures are unremarkable. IMPRESSION: No active disease. Electronically Signed   By: Alcide Clever M.D.   On: 08/15/2023 00:18    Procedures Procedures    Medications Ordered in ED Medications  ipratropium-albuterol (DUONEB) 0.5-2.5 (3) MG/3ML nebulizer solution 3 mL (3 mLs Nebulization Given 08/15/23 0004)  dexamethasone (DECADRON) injection 10 mg (10 mg Intravenous Given 08/15/23 0021)  albuterol (VENTOLIN HFA) 108 (90 Base) MCG/ACT inhaler 2 puff (2 puffs Inhalation Given 08/15/23 0059)    ED Course/ Medical Decision Making/ A&P                                 Medical Decision Making Amount and/or Complexity of Data Reviewed Radiology: ordered.  Risk Prescription drug management.   This patient presents to the ED for concern of chest discomfort, cough, this involves an extensive number of treatment options, and is a complaint that carries with it a high risk of complications and morbidity.  I considered the following differential and admission for this acute, potentially life threatening condition.  The differential diagnosis includes URI such as COVID or influenza, pneumonia, chest wall pain, less likely ACS or PE  MDM:    This is a 33 year old female who presents with chest tightness.  Also recent upper respiratory symptoms and fever.  She is nontoxic.  She is afebrile.  Vital signs are reassuring.  She has an occasional wheeze with cough on exam.  She was given a DuoNeb and Decadron.  Chest x-ray without pneumothorax or pneumonia.  EKG shows no evidence of acute ischemia or arrhythmia.  Influenza testing was positive.  Suspect influenza with overlying bronchitis or asthma exacerbation.  Discussed the risk and benefits of Tamiflu.  Patient declines.  Recommend supportive measures.  Patient was given an inhaler.  (Labs, imaging, consults)  Labs: I Ordered, and personally  interpreted labs.  The pertinent results include: COVID, influenza  Imaging Studies ordered: I ordered imaging studies including chest x-ray I independently visualized and interpreted imaging. I agree with the radiologist interpretation  Additional history obtained from chart review.  External records from outside source obtained and reviewed including prior evaluations  Cardiac Monitoring: The patient was maintained on a cardiac monitor.  If on the cardiac monitor, I personally viewed and interpreted the cardiac monitored which showed an underlying rhythm of: Sinus  Reevaluation: After the interventions noted above, I reevaluated the patient and found that they have :improved  Social Determinants of Health:  lives independently  Disposition: Discharge  Co  morbidities that complicate the patient evaluation  Past Medical History:  Diagnosis Date   ACL (anterior cruciate ligament) tear    Anemia    Anxiety    Asthma    Breast anomaly    repeated breast swelling, redness and bleeding from nipples not associated with breastfeeding   Bronchitis      Medicines Meds ordered this encounter  Medications   ipratropium-albuterol (DUONEB) 0.5-2.5 (3) MG/3ML nebulizer solution 3 mL   DISCONTD: dexamethasone (DECADRON) injection 10 mg   dexamethasone (DECADRON) injection 10 mg   albuterol (VENTOLIN HFA) 108 (90 Base) MCG/ACT inhaler 2 puff    I have reviewed the patients home medicines and have made adjustments as needed  Problem List / ED Course: Problem List Items Addressed This Visit   None Visit Diagnoses       Influenza A    -  Primary     Wheezing                       Final Clinical Impression(s) / ED Diagnoses Final diagnoses:  Influenza A  Wheezing    Rx / DC Orders ED Discharge Orders     None         Shon Baton, MD 08/15/23 0106

## 2024-07-16 ENCOUNTER — Emergency Department (HOSPITAL_BASED_OUTPATIENT_CLINIC_OR_DEPARTMENT_OTHER)
Admission: EM | Admit: 2024-07-16 | Discharge: 2024-07-16 | Disposition: A | Discharge status: Home / Self Care | Attending: Emergency Medicine | Admitting: Emergency Medicine

## 2024-07-16 ENCOUNTER — Emergency Department (HOSPITAL_BASED_OUTPATIENT_CLINIC_OR_DEPARTMENT_OTHER)

## 2024-07-16 ENCOUNTER — Other Ambulatory Visit: Payer: Self-pay

## 2024-07-16 DIAGNOSIS — R1031 Right lower quadrant pain: Secondary | ICD-10-CM | POA: Diagnosis not present

## 2024-07-16 DIAGNOSIS — E871 Hypo-osmolality and hyponatremia: Secondary | ICD-10-CM | POA: Diagnosis not present

## 2024-07-16 DIAGNOSIS — O26891 Other specified pregnancy related conditions, first trimester: Secondary | ICD-10-CM | POA: Diagnosis present

## 2024-07-16 LAB — COMPREHENSIVE METABOLIC PANEL WITH GFR
ALT: 7 U/L (ref 0–44)
AST: 12 U/L — ABNORMAL LOW (ref 15–41)
Albumin: 3.8 g/dL (ref 3.5–5.0)
Alkaline Phosphatase: 52 U/L (ref 38–126)
Anion gap: 10 (ref 5–15)
BUN: <5 mg/dL — ABNORMAL LOW (ref 6–20)
CO2: 23 mmol/L (ref 22–32)
Calcium: 9.1 mg/dL (ref 8.9–10.3)
Chloride: 102 mmol/L (ref 98–111)
Creatinine, Ser: 0.41 mg/dL — ABNORMAL LOW (ref 0.44–1.00)
GFR, Estimated: >60 mL/min
Glucose, Bld: 92 mg/dL (ref 70–99)
Potassium: 3.7 mmol/L (ref 3.5–5.1)
Sodium: 134 mmol/L — ABNORMAL LOW (ref 135–145)
Total Bilirubin: 0.4 mg/dL (ref 0.0–1.2)
Total Protein: 7.3 g/dL (ref 6.5–8.1)

## 2024-07-16 LAB — URINALYSIS, ROUTINE W REFLEX MICROSCOPIC
Bacteria, UA: NONE SEEN
Bilirubin Urine: NEGATIVE
Glucose, UA: NEGATIVE mg/dL
Hgb urine dipstick: NEGATIVE
Leukocytes,Ua: NEGATIVE
Nitrite: NEGATIVE
Specific Gravity, Urine: 1.02 (ref 1.005–1.030)
pH: 8.5 — ABNORMAL HIGH (ref 5.0–8.0)

## 2024-07-16 LAB — HCG, QUANTITATIVE, PREGNANCY: hCG, Beta Chain, Quant, S: 181193 m[IU]/mL — ABNORMAL HIGH

## 2024-07-16 LAB — CBC
HCT: 32.3 % — ABNORMAL LOW (ref 36.0–46.0)
Hemoglobin: 10.8 g/dL — ABNORMAL LOW (ref 12.0–15.0)
MCH: 26.8 pg (ref 26.0–34.0)
MCHC: 33.4 g/dL (ref 30.0–36.0)
MCV: 80.1 fL (ref 80.0–100.0)
Platelets: 335 K/uL (ref 150–400)
RBC: 4.03 MIL/uL (ref 3.87–5.11)
RDW: 15.1 % (ref 11.5–15.5)
WBC: 6.1 K/uL (ref 4.0–10.5)
nRBC: 0 % (ref 0.0–0.2)

## 2024-07-16 LAB — PREGNANCY, URINE: Preg Test, Ur: POSITIVE — AB

## 2024-07-16 LAB — LIPASE, BLOOD: Lipase: 23 U/L (ref 11–51)

## 2024-07-16 MED ORDER — ACETAMINOPHEN 325 MG PO TABS
650.0000 mg | ORAL_TABLET | Freq: Once | ORAL | Status: AC
  Administered 2024-07-16: 650 mg via ORAL
  Filled 2024-07-16: qty 2

## 2024-07-16 MED ORDER — METOCLOPRAMIDE HCL 10 MG PO TABS: 10.0000 mg | ORAL_TABLET | Freq: Four times a day (QID) | ORAL | 0 refills | Status: AC

## 2024-07-16 NOTE — ED Provider Notes (Signed)
 " [place] EMERGENCY DEPARTMENT AT [facility] Provider Note   CSN: [id number] Arrival date & time: 07/16/24  9266     Patient presents with: Pelvic Pain   Barbara Ortiz is a 33 y.o. female.   Patient presents to the emergency department today for evaluation of pelvic pain and cramping.  Symptoms have been present for about 2 weeks.  Patient initially thought that she had a ovarian cyst because she has a had a history of these.  She had a pregnancy test at home and took it and it was positive.  Patient reports missing her period 2 weeks ago.  Prior to that she had to normal periods.  She states that she did not have intercourse for about 2 to 3 months. No fevers. Reports 1 day of watery discharge, but not persistent.  No vaginal bleeding.  Pain favors the right side.  She feels it radiate into her back.  Patient has a history of cesarean section, last 4 years ago.  Denies other abdominal surgeries.      Prior to Admission medications  Medication Sig Start Date End Date Taking? Authorizing Provider  ADVAIR DISKUS 100-50 MCG/ACT AEPB 1 puff 2 (two) times daily. 11/08/21   [provider]  albuterol  (VENTOLIN  HFA) 108 (90 Base) MCG/ACT inhaler Inhale 1 puff into the lungs every 6 (six) hours as needed for wheezing or shortness of breath.    [provider]  fluticasone (FLONASE) 50 MCG/ACT nasal spray Place 1 spray into both nostrils daily as needed for allergies. 11/02/21   [provider]  ketoconazole (NIZORAL) 2 % shampoo Apply 1 application. topically 2 (two) times a week. 09/06/21   [provider]  magnesium oxide (MAG-OX) 400 MG tablet Take 400 mg by mouth daily.    [provider]  Prenatal Vit-Fe Fumarate-FA (PRENATAL VITAMINS PO) Take 1 tablet by mouth daily.    [provider]    Allergies: Copper and Acetaminophen     Review of Systems  Updated Vital Signs BP 137/84 (BP Location: Right Arm)   Pulse 72    Temp 98.2 F (36.8 C) (Oral)   Resp 17   SpO2 100%   Physical Exam Vitals and nursing note reviewed.  Constitutional:      General: She is not in acute distress.    Appearance: She is well-developed.  HENT:     Head: Normocephalic and atraumatic.     Right Ear: External ear normal.     Left Ear: External ear normal.     Nose: Nose normal.  Eyes:     Conjunctiva/sclera: Conjunctivae normal.  Cardiovascular:     Rate and Rhythm: Normal rate and regular rhythm.     Heart sounds: No murmur heard. Pulmonary:     Effort: No respiratory distress.     Breath sounds: No wheezing, rhonchi or rales.  Abdominal:     Palpations: Abdomen is soft.     Tenderness: There is abdominal tenderness. There is no guarding or rebound.     Comments: Lower abdominal tenderness without rebound or guarding  Musculoskeletal:     Cervical back: Normal range of motion and neck supple.     Right lower leg: No edema.     Left lower leg: No edema.  Skin:    General: Skin is warm and dry.     Findings: No rash.  Neurological:     General: No focal deficit present.     Mental Status: She is alert.  Mental status is at baseline.     Motor: No weakness.  Psychiatric:        Mood and Affect: Mood normal.     (all labs ordered are listed, but only abnormal results are displayed) Labs Reviewed  COMPREHENSIVE METABOLIC PANEL WITH GFR - Abnormal; Notable for the following components:      Result Value   Sodium 134 (*)    BUN <5 (*)    Creatinine, Ser 0.41 (*)    AST 12 (*)    All other components within normal limits  CBC - Abnormal; Notable for the following components:   Hemoglobin 10.8 (*)    HCT 32.3 (*)    All other components within normal limits  URINALYSIS, ROUTINE W REFLEX MICROSCOPIC - Abnormal; Notable for the following components:   pH 8.5 (*)    Ketones, ur TRACE (*)    Protein, ur TRACE (*)    All other components within normal limits  PREGNANCY, URINE - Abnormal; Notable for the  following components:   Preg Test, Ur POSITIVE (*)    All other components within normal limits  HCG, QUANTITATIVE, PREGNANCY - Abnormal; Notable for the following components:   hCG, Beta Chain, Quant, S 181,193 (*)    All other components within normal limits  LIPASE, BLOOD    EKG: None  Radiology: US  OB Comp Less 14 Wks Result Date: 07/16/2024 EXAM: OBSTETRIC ULTRASOUND FIRST TRIMESTER TECHNIQUE: Transvaginal first trimester obstetric pelvic duplex ultrasound was performed with real-time imaging, color flow Doppler imaging, and spectral analysis. COMPARISON: None available. CLINICAL HISTORY: Gestational age by last menstrual period, 6 weeks and 2 days. Estimated due date by last menstrual period: 03/09/2025. Last menstrual period: 06/02/2024. FINDINGS: UTERUS: No focal myometrial mass. GESTATIONAL SAC(S): Single intrauterine gestational sac. No subchorionic hemorrhage. YOLK SAC: Present, 5.3 mm. EMBRYO(<11WK) /FETUS(>=11WK): Embryo present. CROWN RUMP LENGTH: 26.7 mm. Crown-rump length corresponds to a gestational age of [redacted] weeks and 3 days. RATE OF CARDIAC ACTIVITY: 173 beats per minute. RIGHT OVARY: Unremarkable with a corpus luteum cyst. LEFT OVARY: Not visualized due to gravid uterus. FREE FLUID: No free fluid. MEASUREMENTS ESTIMATED GESTATIONAL AGE BY CURRENT ULTRASOUND: 9 weeks and 3 days ESTIMATED GESTATIONAL AGE BY LMP/PRIOR ULTRASOUND: 6 weeks and 2 days ESTIMATED DUE DATE: 02/15/2025 IMPRESSION: 1. Single viable intrauterine pregnancy with gestational age by ultrasound of 9 weeks and 3 days which is non-concordant with gestational age by last menstrual period 6 weeks and 2 days (estimated due date 02/15/2025). 2. No subchorionic hemorrhage. 3. Nonvisualization of the left ovary due to gravid uterus. Electronically signed by: Morgane Naveau MD 07/16/2024 11:26 AM EST RP Workstation: [id number]     Procedures   Medications Ordered in the ED  acetaminophen  (TYLENOL ) tablet 650 mg (650  mg Oral Given 07/16/24 1207)    ED Course  Patient seen and examined. History obtained directly from patient.   Labs/EKG: Personally reviewed and interpreted lab work ordered in triage including CBC showing hemoglobin 10.8, normal white blood cell count; CMP unremarkable; lipase normal; UPT positive.  Imaging: Ultrasound ordered to evaluate for ectopic pregnancy  Medications/Fluids: None ordered  Most recent vital signs reviewed and are as follows: BP 137/84 (BP Location: Right Arm)   Pulse 72   Temp 98.2 F (36.8 C) (Oral)   Resp 17   SpO2 100%   Initial impression: Patient with pelvic pain, no vaginal bleeding or discharge currently, suspect early pregnancy.  Will need to rule out ectopic pregnancy.  1:24 PM  Reassessment performed. Patient appears stable, comfortable.  Labs personally reviewed and interpreted including: Quant 181,193.   Imaging personally visualized and interpreted including: Ultrasound demonstrates intrauterine pregnancy at 9 weeks and 3 days.  No complicating factors.  Reviewed pertinent lab work and imaging with patient at bedside. Questions answered.   Most current vital signs reviewed and are as follows: BP 116/66   Pulse 66   Temp 98.8 F (37.1 C) (Oral)   Resp 18   SpO2 100%   Plan: Discharge to home.   Prescriptions written for: Reglan .    Other home care instructions discussed: We discussed doxylamine/B6 to prevent nausea.  ED return instructions discussed: The patient was urged to return to the Emergency Department immediately with worsening of current symptoms, worsening abdominal pain, persistent vomiting, blood noted in stools, fever, vaginal bleeding, or any other concerns. The patient verbalized understanding.   Follow-up instructions discussed: Patient encouraged to follow-up with OB/GYN.                                   Medical Decision Making Amount and/or Complexity of Data Reviewed Labs: ordered. Radiology:  ordered.  Risk OTC drugs.   For this patient's complaint of abdominal pain, the following conditions were considered on the differential diagnosis: gastritis/PUD, enteritis/duodenitis, appendicitis, cholelithiasis/cholecystitis, cholangitis, pancreatitis, ruptured viscus, colitis, diverticulitis, small/large bowel obstruction, proctitis, cystitis, pyelonephritis, ureteral colic, aortic dissection, aortic aneurysm. In women, ectopic pregnancy, pelvic inflammatory disease, ovarian cysts, and tubo-ovarian abscess were also considered. Atypical chest etiologies were also considered including ACS, PE, and pneumonia.  Confirmed pregnancy today.  Confirmed intrauterine pregnancy today.  Patient has overall has a reassuring exam.  No rebound or guarding.  Normal white blood cell count.  I have low concern clinically for appendicitis at this time and do not feel that patient requires CT imaging, unless symptoms were to progress.  The patient's vital signs, pertinent lab work and imaging were reviewed and interpreted as discussed in the ED course. Hospitalization was considered for further testing, treatments, or serial exams/observation. However as patient is well-appearing, has a stable exam, and reassuring studies today, I do not feel that they warrant admission at this time. This plan was discussed with the patient who verbalizes agreement and comfort with this plan and seems reliable and able to return to the Emergency Department with worsening or changing symptoms.       Final diagnoses:  Right lower quadrant abdominal pain affecting pregnancy in first trimester    ED Discharge Orders          Ordered    metoCLOPramide  (REGLAN ) 10 MG tablet  Every 6 hours        07/16/24 1331               Desiderio Chew, PA-C 07/16/24 1334  "

## 2024-07-16 NOTE — Discharge Instructions (Addendum)
 Please read and follow all provided instructions.  Your diagnoses today include:  1. Right lower quadrant abdominal pain affecting pregnancy in first trimester     Tests performed today include: Complete blood cell count: Normal white count, anemia Complete metabolic panel: No concerning finding Lipase (pancreas function test): None Urinalysis (urine test): No signs of infection Pregnancy test (urine or blood, in women only): Positive Ultrasound: shows pregnancy at 9 weeks 3 days, no complications Vital signs. See below for your results today.   Medications prescribed:   For nausea you may try half of a 25mg  doxylamine (Unisom) tablet twice a day and 25mg  of Vitamin B6, also called pyroxidine for nausea and vomiting in pregnancy. These medications are both inexpensive when bought over-the-counter. This is a preventative medication and needs to be taken consistently to help.   Reglan  (metoclopramide ): Medication for nausea to use as needed  Take any prescribed medications only as directed.  Home care instructions:  Follow any educational materials contained in this packet.  Follow-up instructions: Please follow-up with your primary care provider/OBGYN for further evaluation of your symptoms.    Return instructions:  SEEK IMMEDIATE MEDICAL ATTENTION IF: The pain does not go away or becomes severe  A temperature above 101F develops  Repeated vomiting occurs (multiple episodes)  The pain becomes localized to portions of the abdomen. The right side could possibly be appendicitis. In an adult, the left lower portion of the abdomen could be colitis or diverticulitis.  Blood is being passed in stools or vomit (bright red or black tarry stools)  You develop chest pain, difficulty breathing, dizziness or fainting, or become confused, poorly responsive, or inconsolable (young children) If you have any other emergent concerns regarding your health  Additional Information: Abdominal  (belly) pain can be caused by many things. Your caregiver performed an examination and possibly ordered blood/urine tests and imaging (CT scan, x-rays, ultrasound). Many cases can be observed and treated at home after initial evaluation in the emergency department. Even though you are being discharged home, abdominal pain can be unpredictable. Therefore, you need a repeated exam if your pain does not resolve, returns, or worsens. Most patients with abdominal pain don't have to be admitted to the hospital or have surgery, but serious problems like appendicitis and gallbladder attacks can start out as nonspecific pain. Many abdominal conditions cannot be diagnosed in one visit, so follow-up evaluations are very important.  Your vital signs today were: BP 116/66   Pulse 66   Temp 98.8 F (37.1 C) (Oral)   Resp 18   SpO2 100%  If your blood pressure (bp) was elevated above 135/85 this visit, please have this repeated by your doctor within one month. --------------

## 2024-07-16 NOTE — ED Triage Notes (Signed)
 Reports pelvic pain and lower back pain x 2 weeks. Lower abd cramping. Denies vaginal bleeding but states + at home pregnancy test.
